# Patient Record
Sex: Female | Born: 1970
Health system: Southern US, Community
[De-identification: ages and names within clinical notes are randomized; demographics above are authoritative.]

## PROBLEM LIST (undated history)

## (undated) ENCOUNTER — Ambulatory Visit: Source: Home / Self Care

## (undated) DIAGNOSIS — S43429A Sprain of unspecified rotator cuff capsule, initial encounter: Secondary | ICD-10-CM

## (undated) DIAGNOSIS — D649 Anemia, unspecified: Secondary | ICD-10-CM

## (undated) DIAGNOSIS — M1990 Unspecified osteoarthritis, unspecified site: Secondary | ICD-10-CM

## (undated) DIAGNOSIS — G43909 Migraine, unspecified, not intractable, without status migrainosus: Secondary | ICD-10-CM

## (undated) DIAGNOSIS — J302 Other seasonal allergic rhinitis: Secondary | ICD-10-CM

## (undated) DIAGNOSIS — B019 Varicella without complication: Secondary | ICD-10-CM

## (undated) DIAGNOSIS — IMO0001 Reserved for inherently not codable concepts without codable children: Secondary | ICD-10-CM

## (undated) DIAGNOSIS — K5792 Diverticulitis of intestine, part unspecified, without perforation or abscess without bleeding: Secondary | ICD-10-CM

## (undated) DIAGNOSIS — M549 Dorsalgia, unspecified: Secondary | ICD-10-CM

## (undated) DIAGNOSIS — F329 Major depressive disorder, single episode, unspecified: Secondary | ICD-10-CM

## (undated) HISTORY — DX: Unspecified osteoarthritis, unspecified site: M19.90

## (undated) HISTORY — DX: Varicella without complication: B01.9

## (undated) HISTORY — DX: Migraine, unspecified, not intractable, without status migrainosus: G43.909

## (undated) HISTORY — DX: Major depressive disorder, single episode, unspecified: F32.9

## (undated) HISTORY — DX: Anemia, unspecified: D64.9

## (undated) HISTORY — PX: JOINT REPLACEMENT: SHX530

## (undated) HISTORY — DX: Diverticulitis of intestine, part unspecified, without perforation or abscess without bleeding: K57.92

## (undated) HISTORY — PX: TUBAL LIGATION: SHX77

---

## 1997-10-29 ENCOUNTER — Emergency Department (HOSPITAL_COMMUNITY): Admission: EM | Admit: 1997-10-29 | Discharge: 1997-10-29 | Payer: Self-pay | Admitting: Emergency Medicine

## 1998-08-13 ENCOUNTER — Encounter: Payer: Self-pay | Admitting: Family Medicine

## 1998-08-13 ENCOUNTER — Ambulatory Visit (HOSPITAL_COMMUNITY): Admission: RE | Admit: 1998-08-13 | Discharge: 1998-08-13 | Payer: Self-pay | Admitting: Family Medicine

## 1999-12-20 ENCOUNTER — Emergency Department (HOSPITAL_COMMUNITY): Admission: EM | Admit: 1999-12-20 | Discharge: 1999-12-20 | Payer: Self-pay | Admitting: Emergency Medicine

## 2000-02-17 ENCOUNTER — Encounter: Payer: Self-pay | Admitting: Internal Medicine

## 2000-02-17 ENCOUNTER — Encounter: Admission: RE | Admit: 2000-02-17 | Discharge: 2000-02-17 | Payer: Self-pay | Admitting: Internal Medicine

## 2000-10-30 ENCOUNTER — Inpatient Hospital Stay (HOSPITAL_COMMUNITY): Admission: EM | Admit: 2000-10-30 | Discharge: 2000-11-01 | Payer: Self-pay | Admitting: Emergency Medicine

## 2002-01-13 DIAGNOSIS — F32A Depression, unspecified: Secondary | ICD-10-CM

## 2002-01-13 HISTORY — DX: Depression, unspecified: F32.A

## 2002-04-25 ENCOUNTER — Other Ambulatory Visit: Admission: RE | Admit: 2002-04-25 | Discharge: 2002-04-25 | Payer: Self-pay | Admitting: Family Medicine

## 2002-08-16 ENCOUNTER — Other Ambulatory Visit: Admission: RE | Admit: 2002-08-16 | Discharge: 2002-08-16 | Payer: Self-pay | Admitting: Family Medicine

## 2005-05-14 ENCOUNTER — Encounter: Payer: Self-pay | Admitting: Emergency Medicine

## 2006-12-17 ENCOUNTER — Other Ambulatory Visit: Admission: RE | Admit: 2006-12-17 | Discharge: 2006-12-17 | Payer: Self-pay | Admitting: Obstetrics and Gynecology

## 2007-10-20 ENCOUNTER — Emergency Department (HOSPITAL_COMMUNITY): Admission: EM | Admit: 2007-10-20 | Discharge: 2007-10-21 | Payer: Self-pay | Admitting: Emergency Medicine

## 2007-10-31 ENCOUNTER — Emergency Department (HOSPITAL_BASED_OUTPATIENT_CLINIC_OR_DEPARTMENT_OTHER): Admission: EM | Admit: 2007-10-31 | Discharge: 2007-11-01 | Payer: Self-pay | Admitting: Emergency Medicine

## 2007-11-18 ENCOUNTER — Encounter: Admission: RE | Admit: 2007-11-18 | Discharge: 2007-11-18 | Payer: Self-pay | Admitting: Family Medicine

## 2007-12-29 ENCOUNTER — Ambulatory Visit: Payer: Self-pay | Admitting: Diagnostic Radiology

## 2007-12-29 ENCOUNTER — Emergency Department (HOSPITAL_BASED_OUTPATIENT_CLINIC_OR_DEPARTMENT_OTHER): Admission: EM | Admit: 2007-12-29 | Discharge: 2007-12-29 | Payer: Self-pay | Admitting: Emergency Medicine

## 2008-05-06 ENCOUNTER — Ambulatory Visit: Payer: Self-pay | Admitting: Diagnostic Radiology

## 2008-05-06 ENCOUNTER — Emergency Department (HOSPITAL_BASED_OUTPATIENT_CLINIC_OR_DEPARTMENT_OTHER): Admission: EM | Admit: 2008-05-06 | Discharge: 2008-05-07 | Payer: Self-pay | Admitting: Emergency Medicine

## 2009-11-13 ENCOUNTER — Emergency Department (HOSPITAL_BASED_OUTPATIENT_CLINIC_OR_DEPARTMENT_OTHER): Admission: EM | Admit: 2009-11-13 | Discharge: 2009-11-13 | Payer: Self-pay | Admitting: Emergency Medicine

## 2009-11-13 ENCOUNTER — Ambulatory Visit: Payer: Self-pay | Admitting: Diagnostic Radiology

## 2009-11-28 ENCOUNTER — Emergency Department (HOSPITAL_BASED_OUTPATIENT_CLINIC_OR_DEPARTMENT_OTHER): Admission: EM | Admit: 2009-11-28 | Discharge: 2009-11-28 | Payer: Self-pay | Admitting: Emergency Medicine

## 2010-01-03 ENCOUNTER — Emergency Department (HOSPITAL_COMMUNITY)
Admission: EM | Admit: 2010-01-03 | Discharge: 2010-01-03 | Payer: Self-pay | Source: Home / Self Care | Admitting: Emergency Medicine

## 2010-01-10 ENCOUNTER — Emergency Department (HOSPITAL_BASED_OUTPATIENT_CLINIC_OR_DEPARTMENT_OTHER)
Admission: EM | Admit: 2010-01-10 | Discharge: 2010-01-10 | Payer: Self-pay | Source: Home / Self Care | Admitting: Emergency Medicine

## 2010-01-13 HISTORY — PX: CERVICAL DISCECTOMY: SHX98

## 2010-02-22 ENCOUNTER — Encounter (HOSPITAL_COMMUNITY)
Admission: RE | Admit: 2010-02-22 | Discharge: 2010-02-22 | Disposition: A | Discharge status: Home / Self Care | Payer: Managed Care, Other (non HMO) | Source: Ambulatory Visit | Attending: Neurological Surgery | Admitting: Neurological Surgery

## 2010-02-22 ENCOUNTER — Other Ambulatory Visit (HOSPITAL_COMMUNITY): Payer: Self-pay | Admitting: Neurological Surgery

## 2010-02-22 ENCOUNTER — Ambulatory Visit (HOSPITAL_COMMUNITY)
Admission: RE | Admit: 2010-02-22 | Discharge: 2010-02-22 | Disposition: A | Discharge status: Home / Self Care | Payer: Managed Care, Other (non HMO) | Source: Ambulatory Visit | Attending: Neurological Surgery | Admitting: Neurological Surgery

## 2010-02-22 DIAGNOSIS — M47812 Spondylosis without myelopathy or radiculopathy, cervical region: Secondary | ICD-10-CM | POA: Insufficient documentation

## 2010-02-22 DIAGNOSIS — Z01818 Encounter for other preprocedural examination: Secondary | ICD-10-CM | POA: Insufficient documentation

## 2010-02-22 LAB — BASIC METABOLIC PANEL
CO2: 27 mEq/L (ref 19–32)
Calcium: 9.2 mg/dL (ref 8.4–10.5)
Chloride: 103 mEq/L (ref 96–112)
Creatinine, Ser: 0.63 mg/dL (ref 0.4–1.2)
GFR calc Af Amer: >60 mL/min (ref >60)
Glucose, Bld: 95 mg/dL (ref 70–99)

## 2010-02-22 LAB — PROTIME-INR
INR: 0.93 (ref 0.00–1.49)
Prothrombin Time: 12.7 seconds (ref 11.6–15.2)

## 2010-02-22 LAB — DIFFERENTIAL
Basophils Absolute: 0 10*3/uL (ref 0.0–0.1)
Eosinophils Absolute: 0.1 10*3/uL (ref 0.0–0.7)
Eosinophils Relative: 3 % (ref 0–5)
Neutro Abs: 2.5 10*3/uL (ref 1.7–7.7)
Neutrophils Relative %: 54 % (ref 43–77)

## 2010-02-22 LAB — CBC
HCT: 38.2 % (ref 36.0–46.0)
MCH: 29.3 pg (ref 26.0–34.0)
MCHC: 33.8 g/dL (ref 30.0–36.0)
MCV: 86.8 fL (ref 78.0–100.0)
Platelets: 311 10*3/uL (ref 150–400)
RBC: 4.4 MIL/uL (ref 3.87–5.11)

## 2010-02-22 LAB — SURGICAL PCR SCREEN: MRSA, PCR: NEGATIVE

## 2010-02-27 ENCOUNTER — Observation Stay (HOSPITAL_COMMUNITY)
Admission: RE | Admit: 2010-02-27 | Discharge: 2010-02-28 | Disposition: A | Discharge status: Home / Self Care | Payer: Managed Care, Other (non HMO) | Source: Ambulatory Visit | Attending: Neurological Surgery | Admitting: Neurological Surgery

## 2010-02-27 ENCOUNTER — Ambulatory Visit (HOSPITAL_COMMUNITY): Admission: RE | Admit: 2010-02-27 | Payer: Self-pay | Source: Home / Self Care | Admitting: Neurological Surgery

## 2010-02-27 ENCOUNTER — Ambulatory Visit (HOSPITAL_COMMUNITY): Payer: Managed Care, Other (non HMO)

## 2010-02-27 DIAGNOSIS — M47812 Spondylosis without myelopathy or radiculopathy, cervical region: Principal | ICD-10-CM | POA: Insufficient documentation

## 2010-03-04 NOTE — Op Note (Signed)
NAME:  Krystal, Mann NO.:  [id number]  MEDICAL RECORD NO.:  [id number]           PATIENT TYPE:  I  LOCATION:  3533                         FACILITY:  [facility]  PHYSICIAN:  Tia Alert, MD     DATE OF BIRTH:  1970-02-18  DATE OF PROCEDURE: DATE OF DISCHARGE:                              OPERATIVE REPORT   PREOPERATIVE DIAGNOSIS:  Cervical spondylosis C4-5 and C5-6 with neck and right arm pain.  POSTOPERATIVE DIAGNOSIS:  Cervical spondylosis C4-5 and C5-6 with neck and right arm pain.  PROCEDURES: 1. Decompressive anterior cervical diskectomy C4-5 and C5-6. 2. Anterior cervical arthrodesis C4-5 and C5-6 utilizing 8-mm PEEK     interbody cages packed with local autograft and Actifuse Putty. 3. Anterior cervical plating C4-C6 utilizing the Biomet MaxAn plate.  SURGEON:  Tia Alert, MD  ASSISTANT:  Donalee Citrin, MD  ANESTHESIA:  General endotracheal.  COMPLICATIONS:  None apparent.  INDICATIONS FOR PROCEDURE:  Ms. Krystal Mann is a 40 year old female who was referred with neck and right arm pain.  She had an MRI which showed severe spondylosis at C4-5 and C5-6.  She tried medical management for quite some time without significant relief.  I recommended two-level ACDF and plating at C4-5 and C5-6 in hopes of improving her pain syndrome.  She understood the risks, the benefits, expected outcome, and wished to proceed.  DESCRIPTION OF PROCEDURE:  The patient was taken to the operating room, and after induction of adequate general endotracheal anesthesia she was placed in supine position on the operating room table.  Her right anterior cervical region was prepped with DuraPrep and then draped in usual sterile fashion.  Local anesthesia 3 mL was injected and a transverse incision was made to the right of midline and carried down to do platysma which was elevated and then undermined with Metzenbaum scissors.  I then dissected a plane medial to the  sternocleidomastoid muscle and internal carotid artery and lateral to the trachea and esophagus to expose C4-5 and C5-6.  Intraoperative x-ray confirmed my level and then I took down the longus colli muscles.  I removed the anterior osteophytes at C5-6 and then place the Shadow-Line retractors. I incised the disk space at both levels and performed the initial diskectomy with pituitary rongeurs and curved curettes.  I then used the high-speed drill to drill the endplates.  I widened the disk spaces to about 8 mm at each level drilling in a rectangular fashion, preparing the endplates for later arthrodesis.  I drilled down to the level of the posterior spurs.  The drill shavings were saved in a mucous trap for later arthrodesis.  I then brought in the operating microscope.  The exact same decompression was done of both levels.  I was able to open the posterior longitudinal ligament and then removed it undercutting the bodies of C4 and C5 and C5 and C6 until the dura was full and capacious all across, and bilateral foraminotomies were performed.  I would pass the nerve hook easily in a circumferential fashion and into the foramen. I could see the cord pulsatile through the dura.  The  dura appeared to be relaxed.  Therefore, I felt like I had adequate decompression of both levels, irrigated with saline solution, dried the surgical bed, and then placed 8-mm PEEK interbody cages into the interspaces at each level. The cages were packed with a local autograft and Actifuse Putty.  I then used a Biomet MaxAn plate and placed two 14-mm variable angle screws in the bodies C4, C5, and C6, and these were locked into plate by locking mechanism within the plate.  I then irrigated with saline solution containing bacitracin, dried all bleeding points with Surgifoam and bipolar cautery.  Once meticulous hemostasis was achieved, I closed the platysma with 3-0 Vicryl closing the subcuticular tissue with 0  Vicryl and closed the skin with Benzoin and Steri-Strips.  The drapes were removed.  Sterile dressing was applied.  The patient was awakened from anesthesia and transferred to recovery room in stable condition.  At the end of the procedure, all sponge, needle, and instrument counts were correct.     Tia Alert, MD     DSJ/MEDQ  D:  02/27/2010  T:  02/27/2010  Job:  [id number]  Electronically Signed by Marikay Alar MD on 03/04/2010 10:54:13 AM

## 2010-04-23 ENCOUNTER — Ambulatory Visit
Admission: RE | Admit: 2010-04-23 | Discharge: 2010-04-23 | Disposition: A | Discharge status: Home / Self Care | Payer: Managed Care, Other (non HMO) | Source: Ambulatory Visit | Attending: Neurological Surgery | Admitting: Neurological Surgery

## 2010-04-23 ENCOUNTER — Other Ambulatory Visit: Payer: Self-pay | Admitting: Neurological Surgery

## 2010-04-23 DIAGNOSIS — M47812 Spondylosis without myelopathy or radiculopathy, cervical region: Secondary | ICD-10-CM

## 2010-04-23 DIAGNOSIS — M542 Cervicalgia: Secondary | ICD-10-CM

## 2010-04-23 DIAGNOSIS — M79609 Pain in unspecified limb: Secondary | ICD-10-CM

## 2010-06-25 ENCOUNTER — Emergency Department (HOSPITAL_BASED_OUTPATIENT_CLINIC_OR_DEPARTMENT_OTHER)
Admission: EM | Admit: 2010-06-25 | Discharge: 2010-06-25 | Disposition: A | Discharge status: Home / Self Care | Payer: Managed Care, Other (non HMO) | Attending: Emergency Medicine | Admitting: Emergency Medicine

## 2010-06-25 DIAGNOSIS — R112 Nausea with vomiting, unspecified: Secondary | ICD-10-CM | POA: Insufficient documentation

## 2010-06-25 DIAGNOSIS — R197 Diarrhea, unspecified: Secondary | ICD-10-CM | POA: Insufficient documentation

## 2010-06-25 LAB — URINALYSIS, ROUTINE W REFLEX MICROSCOPIC
Bilirubin Urine: NEGATIVE
Ketones, ur: NEGATIVE mg/dL
Specific Gravity, Urine: 1.011 (ref 1.005–1.030)

## 2010-06-25 LAB — BASIC METABOLIC PANEL
Calcium: 9.3 mg/dL (ref 8.4–10.5)
GFR calc Af Amer: >60 mL/min (ref >60)
GFR calc non Af Amer: >60 mL/min (ref >60)

## 2010-06-25 LAB — URINE MICROSCOPIC-ADD ON

## 2010-07-02 ENCOUNTER — Emergency Department (HOSPITAL_BASED_OUTPATIENT_CLINIC_OR_DEPARTMENT_OTHER)
Admission: EM | Admit: 2010-07-02 | Discharge: 2010-07-02 | Disposition: A | Discharge status: Home / Self Care | Payer: Managed Care, Other (non HMO) | Attending: Emergency Medicine | Admitting: Emergency Medicine

## 2010-07-02 ENCOUNTER — Emergency Department (INDEPENDENT_AMBULATORY_CARE_PROVIDER_SITE_OTHER): Payer: Managed Care, Other (non HMO)

## 2010-07-02 DIAGNOSIS — M25569 Pain in unspecified knee: Secondary | ICD-10-CM | POA: Insufficient documentation

## 2010-10-14 LAB — POCT I-STAT, CHEM 8
BUN: 6
Chloride: 103
Glucose, Bld: 102 — ABNORMAL HIGH
HCT: 38
Hemoglobin: 12.9
Potassium: 3.8
TCO2: 28

## 2010-10-14 LAB — URINALYSIS, ROUTINE W REFLEX MICROSCOPIC
Bilirubin Urine: NEGATIVE
Nitrite: NEGATIVE
Protein, ur: NEGATIVE
Urobilinogen, UA: 1
pH: 6

## 2010-10-14 LAB — URINE MICROSCOPIC-ADD ON

## 2010-10-14 LAB — POCT PREGNANCY, URINE: Preg Test, Ur: NEGATIVE

## 2010-10-18 LAB — URINALYSIS, ROUTINE W REFLEX MICROSCOPIC
Protein, ur: NEGATIVE mg/dL
Urobilinogen, UA: 1 mg/dL (ref 0.0–1.0)

## 2010-10-18 LAB — WET PREP, GENITAL

## 2010-10-18 LAB — PREGNANCY, URINE: Preg Test, Ur: NEGATIVE

## 2010-10-18 LAB — DIFFERENTIAL
Basophils Relative: 3 % — ABNORMAL HIGH (ref 0–1)
Lymphs Abs: 1.5 10*3/uL (ref 0.7–4.0)
Monocytes Absolute: 0.7 10*3/uL (ref 0.1–1.0)
Monocytes Relative: 11 % (ref 3–12)
Neutro Abs: 3.4 10*3/uL (ref 1.7–7.7)

## 2010-10-18 LAB — GC/CHLAMYDIA PROBE AMP, GENITAL
Chlamydia, DNA Probe: NEGATIVE
GC Probe Amp, Genital: NEGATIVE

## 2010-10-18 LAB — RPR: RPR Ser Ql: NONREACTIVE

## 2010-10-18 LAB — CBC
Hemoglobin: 12.1 g/dL (ref 12.0–15.0)
MCHC: 33 g/dL (ref 30.0–36.0)
RBC: 4.34 MIL/uL (ref 3.87–5.11)

## 2010-10-18 LAB — COMPREHENSIVE METABOLIC PANEL
AST: 26 U/L (ref 0–37)
Albumin: 4.2 g/dL (ref 3.5–5.2)
CO2: 26 mEq/L (ref 19–32)
Calcium: 9.3 mg/dL (ref 8.4–10.5)
Creatinine, Ser: 0.7 mg/dL (ref 0.4–1.2)
GFR calc Af Amer: >60 mL/min (ref >60)
GFR calc non Af Amer: >60 mL/min (ref >60)

## 2010-12-19 ENCOUNTER — Ambulatory Visit (INDEPENDENT_AMBULATORY_CARE_PROVIDER_SITE_OTHER): Payer: Managed Care, Other (non HMO) | Admitting: Obstetrics & Gynecology

## 2010-12-19 VITALS — BP 119/80 | HR 92 | Temp 97.1°F | Ht 67.0 in | Wt 231.0 lb

## 2010-12-19 DIAGNOSIS — Z1231 Encounter for screening mammogram for malignant neoplasm of breast: Secondary | ICD-10-CM

## 2010-12-19 DIAGNOSIS — Z23 Encounter for immunization: Secondary | ICD-10-CM

## 2010-12-19 DIAGNOSIS — Z01419 Encounter for gynecological examination (general) (routine) without abnormal findings: Secondary | ICD-10-CM

## 2010-12-19 MED ORDER — INFLUENZA VIRUS VACC SPLIT PF IM SUSP
0.5000 mL | Freq: Once | INTRAMUSCULAR | Status: AC
  Administered 2010-12-19: 0.5 mL via INTRAMUSCULAR

## 2010-12-19 NOTE — Progress Notes (Signed)
  Subjective:     Krystal Mann is a 40 y.o. [id number] female here for a routine exam.  Current complaints: None.     Gynecologic History Patient's last menstrual period was 12/04/2010. Last mammogram: 2009. Results were: normal  Obstetric History OB History    Grav Para Term Preterm Abortions TAB SAB Ect Mult Living   3 2 1 1 1  1   2      # Outc Date GA Lbr Len/2nd Wgt Sex Del Anes PTL Lv   1 TRM            2 PRE            3 SAB                The following portions of the patient's history were reviewed and updated as appropriate: allergies, current medications, past family history, past medical history, past social history, past surgical history and problem list.  Review of Systems A comprehensive review of systems was negative.    Objective:  Blood pressure 119/80, pulse 92, temperature 97.1 F (36.2 C), temperature source Oral, height 5\' 7"  (1.702 m), weight 231 lb (104.781 kg), last menstrual period 12/04/2010.  GENERAL: Well-developed, well-nourished female in no acute distress.  HEENT: Normocephalic, atraumatic. Sclerae anicteric.  NECK: Supple. Normal thyroid.  LUNGS: Clear to auscultation bilaterally.  HEART: Regular rate and rhythm. BREASTS: Symmetric with everted nipples. No masses, skin changes, nipple drainage, or lymphadenopathy. ABDOMEN: Soft, nontender, nondistended. No organomegaly. PELVIC: Normal external female genitalia. Vagina is pink and rugated.  Normal discharge. Normal cervix contour. Pap smear obtained. Uterus is normal in size. No adnexal mass or tenderness.  EXTREMITIES: No cyanosis, clubbing, or edema, 2+ distal pulses.     Assessment:    Healthy female exam.    Plan:    Follow up pap and schedule mammogram.  RTC in one year or as needed.

## 2010-12-19 NOTE — Patient Instructions (Signed)

## 2011-01-01 ENCOUNTER — Ambulatory Visit: Payer: Managed Care, Other (non HMO) | Admitting: Obstetrics and Gynecology

## 2011-01-23 ENCOUNTER — Ambulatory Visit (HOSPITAL_COMMUNITY): Admission: RE | Admit: 2011-01-23 | Payer: Managed Care, Other (non HMO) | Source: Ambulatory Visit

## 2011-03-17 ENCOUNTER — Emergency Department (HOSPITAL_BASED_OUTPATIENT_CLINIC_OR_DEPARTMENT_OTHER)
Admission: EM | Admit: 2011-03-17 | Discharge: 2011-03-17 | Disposition: A | Discharge status: Home / Self Care | Payer: Managed Care, Other (non HMO) | Attending: Emergency Medicine | Admitting: Emergency Medicine

## 2011-03-17 ENCOUNTER — Encounter (HOSPITAL_BASED_OUTPATIENT_CLINIC_OR_DEPARTMENT_OTHER): Payer: Self-pay | Admitting: Family Medicine

## 2011-03-17 DIAGNOSIS — J3489 Other specified disorders of nose and nasal sinuses: Secondary | ICD-10-CM | POA: Insufficient documentation

## 2011-03-17 DIAGNOSIS — R05 Cough: Secondary | ICD-10-CM | POA: Insufficient documentation

## 2011-03-17 DIAGNOSIS — F3289 Other specified depressive episodes: Secondary | ICD-10-CM | POA: Insufficient documentation

## 2011-03-17 DIAGNOSIS — R220 Localized swelling, mass and lump, head: Secondary | ICD-10-CM | POA: Insufficient documentation

## 2011-03-17 DIAGNOSIS — J111 Influenza due to unidentified influenza virus with other respiratory manifestations: Secondary | ICD-10-CM | POA: Insufficient documentation

## 2011-03-17 DIAGNOSIS — R059 Cough, unspecified: Secondary | ICD-10-CM | POA: Insufficient documentation

## 2011-03-17 DIAGNOSIS — F329 Major depressive disorder, single episode, unspecified: Secondary | ICD-10-CM | POA: Insufficient documentation

## 2011-03-17 DIAGNOSIS — IMO0001 Reserved for inherently not codable concepts without codable children: Secondary | ICD-10-CM | POA: Insufficient documentation

## 2011-03-17 DIAGNOSIS — R221 Localized swelling, mass and lump, neck: Secondary | ICD-10-CM | POA: Insufficient documentation

## 2011-03-17 DIAGNOSIS — R110 Nausea: Secondary | ICD-10-CM | POA: Insufficient documentation

## 2011-03-17 DIAGNOSIS — R070 Pain in throat: Secondary | ICD-10-CM | POA: Insufficient documentation

## 2011-03-17 DIAGNOSIS — M1710 Unilateral primary osteoarthritis, unspecified knee: Secondary | ICD-10-CM | POA: Insufficient documentation

## 2011-03-17 DIAGNOSIS — R51 Headache: Secondary | ICD-10-CM | POA: Insufficient documentation

## 2011-03-17 DIAGNOSIS — R509 Fever, unspecified: Secondary | ICD-10-CM | POA: Insufficient documentation

## 2011-03-17 HISTORY — DX: Dorsalgia, unspecified: M54.9

## 2011-03-17 HISTORY — DX: Sprain of unspecified rotator cuff capsule, initial encounter: S43.429A

## 2011-03-17 MED ORDER — OSELTAMIVIR PHOSPHATE 75 MG PO CAPS: 75.0000 mg | ORAL_CAPSULE | Freq: Two times a day (BID) | ORAL | Status: AC

## 2011-03-17 NOTE — ED Provider Notes (Signed)
History     CSN: [id number]  Arrival date & time 03/17/11  1242   First MD Initiated Contact with Patient 03/17/11 1321      Chief Complaint  Patient presents with  . Cough  . Sore Throat  . Facial Pain    (Consider location/radiation/quality/duration/timing/severity/associated sxs/prior treatment) Patient is a 41 y.o. female presenting with pharyngitis and URI. The history is provided by the patient.  Sore Throat Pertinent negatives include no abdominal pain and no headaches.  URI The primary symptoms include fever, sore throat, cough, nausea and myalgias. Primary symptoms do not include headaches, swollen glands, abdominal pain or vomiting. The current episode started yesterday. This is a new problem. The problem has been gradually worsening.  The onset of the illness is associated with exposure to sick contacts. Symptoms associated with the illness include chills, sinus pressure, congestion and rhinorrhea.    Past Medical History  Diagnosis Date  . Anemia   . Depression 2004  . Arthritis     knee  . Back pain   . Rotator cuff (capsule) sprain     Past Surgical History  Procedure Date  . Spine surgery   . Tubal ligation     Family History  Problem Relation Age of Onset  . Cancer Mother     malaginant cancer, skin cancer  . Diabetes Mother   . Cancer Father     lung cancer    History  Substance Use Topics  . Smoking status: Never Smoker   . Smokeless tobacco: Never Used  . Alcohol Use: No    OB History    Grav Para Term Preterm Abortions TAB SAB Ect Mult Living   3 2 1 1 1  1   2       Review of Systems  Constitutional: Positive for fever and chills.  HENT: Positive for congestion, sore throat, rhinorrhea and sinus pressure.   Respiratory: Positive for cough.   Gastrointestinal: Positive for nausea. Negative for vomiting and abdominal pain.  Musculoskeletal: Positive for myalgias.  Neurological: Negative for headaches.  All other systems reviewed  and are negative.    Allergies  Morphine and related; Hydrocodone; and Hydroxyprogesterone caproate  Home Medications   Current Outpatient Rx  Name Route Sig Dispense Refill  . ZICAM COLD REMEDY PO Oral Take by mouth.    . TRAMADOL HCL 50 MG PO TABS Oral Take 50 mg by mouth every 8 (eight) hours as needed.      BP 125/84  Pulse 60  Temp(Src) 98.8 F (37.1 C) (Oral)  Resp 14  SpO2 98%  LMP 03/17/2011  Physical Exam  Nursing note and vitals reviewed. Constitutional: She is oriented to person, place, and time. She appears well-developed and well-nourished. No distress.  HENT:  Head: Normocephalic and atraumatic.  Right Ear: Tympanic membrane and ear canal normal.  Left Ear: Tympanic membrane and ear canal normal.  Nose: Mucosal edema and rhinorrhea present.  Mouth/Throat: Posterior oropharyngeal erythema present. No oropharyngeal exudate, posterior oropharyngeal edema or tonsillar abscesses.  Eyes: EOM are normal. Pupils are equal, round, and reactive to light.  Neck: Normal range of motion.  Cardiovascular: Normal rate, regular rhythm, normal heart sounds and intact distal pulses.  Exam reveals no friction rub.   No murmur heard. Pulmonary/Chest: Effort normal and breath sounds normal. She has no wheezes. She has no rales.  Abdominal: Soft. Bowel sounds are normal. She exhibits no distension. There is no tenderness. There is no rebound and no guarding.  Musculoskeletal: Normal range of motion. She exhibits no tenderness.       Shoulder in sling due to the torn rotator cuff  Lymphadenopathy:    She has no cervical adenopathy.  Neurological: She is alert and oriented to person, place, and time. No cranial nerve deficit.  Skin: Skin is warm and dry. No rash noted.  Psychiatric: She has a normal mood and affect. Her behavior is normal.    ED Course  Procedures (including critical care time)  Labs Reviewed - No data to display No results found.   No diagnosis  found.    MDM   Pt with symptoms consistent with influenza.  Normal exam here but is febrile.  No signs of breathing difficulty  No signs of strep pharyngitis, otitis or abnormal abdominal findings.   Will continue antipyretica and rest and fluids and return for any further problems.  Pt started on tamiflu.         Gwyneth Sprout, MD 03/17/11 1334

## 2011-03-17 NOTE — ED Notes (Signed)
Pt c/o dry cough, sinus pressure, sore throat and chest congestion.

## 2011-06-18 ENCOUNTER — Other Ambulatory Visit (INDEPENDENT_AMBULATORY_CARE_PROVIDER_SITE_OTHER): Payer: Self-pay

## 2011-06-18 DIAGNOSIS — R300 Dysuria: Secondary | ICD-10-CM

## 2011-06-18 LAB — POCT URINALYSIS DIP (DEVICE)
Ketones, ur: NEGATIVE mg/dL
Nitrite: NEGATIVE
Protein, ur: NEGATIVE mg/dL
Urobilinogen, UA: 0.2 mg/dL (ref 0.0–1.0)

## 2011-06-18 NOTE — Progress Notes (Signed)
Pt stopped by clinic states that she thinks she has a uti, has dysuria. Pt could not stay to wait for results. Advised her that we would run urinalysis and have provider review. We will let her know the results, and if medication is needed.  Rosalita Chessman reviewed patients urinalysis results and advised that we culture and have patient use Azo standard for now. Patient notified and agrees with plan.

## 2011-06-21 LAB — URINE CULTURE

## 2011-10-07 ENCOUNTER — Other Ambulatory Visit: Payer: Self-pay | Admitting: Obstetrics and Gynecology

## 2011-10-07 MED ORDER — HYDROCHLOROTHIAZIDE 25 MG PO TABS: 25.0000 mg | ORAL_TABLET | Freq: Every day | ORAL | Status: DC

## 2011-10-08 ENCOUNTER — Telehealth: Payer: Self-pay | Admitting: *Deleted

## 2011-10-08 MED ORDER — HYDROCHLOROTHIAZIDE 25 MG PO TABS: 25.0000 mg | ORAL_TABLET | Freq: Every day | ORAL | Status: DC

## 2011-10-08 NOTE — Telephone Encounter (Signed)
Pt prescribed hctz by Dr Jolayne Panther yesterday, she went to the pharmacy twice to get it and was told it was not there. Pt requests that prescription be sent to [facility] on [address].

## 2011-10-10 ENCOUNTER — Ambulatory Visit (INDEPENDENT_AMBULATORY_CARE_PROVIDER_SITE_OTHER): Payer: Self-pay | Admitting: Family Medicine

## 2011-10-10 ENCOUNTER — Encounter: Payer: Self-pay | Admitting: Family Medicine

## 2011-10-10 VITALS — BP 117/82 | HR 105 | Temp 99.2°F | Ht 67.5 in | Wt 256.0 lb

## 2011-10-10 DIAGNOSIS — M7989 Other specified soft tissue disorders: Secondary | ICD-10-CM

## 2011-10-10 DIAGNOSIS — R600 Localized edema: Secondary | ICD-10-CM | POA: Insufficient documentation

## 2011-10-10 DIAGNOSIS — Z7189 Other specified counseling: Secondary | ICD-10-CM

## 2011-10-10 DIAGNOSIS — Z7689 Persons encountering health services in other specified circumstances: Secondary | ICD-10-CM

## 2011-10-10 NOTE — Progress Notes (Signed)
  Subjective:    Patient ID: Krystal Mann, female    DOB: 05-Oct-1970, 41 y.o.   MRN: [id number]  HPI  Krystal Mann presents to establish care.  She complains of lower extremity edema that started a few weeks ago.  She does not know the cause.  Dr. Jolayne Panther at [facility] clinic started her on HCTZ although she does not have a history of HTN.  She says this has helped with the swelling.  She has also been elevating her feet which helps.  She denies any shortness of breath or chest pain, or any cardiac history.    Past Medical History  Diagnosis Date  . Anemia   . Depression 2004  . Arthritis     knee  . Back pain   . Rotator cuff (capsule) sprain    Past Surgical History  Procedure Date  . Spine surgery   . Tubal ligation    Family History  Problem Relation Age of Onset  . Cancer Mother     malaginant cancer, skin cancer  . Diabetes Mother   . Hyperlipidemia Mother   . Cancer Father     lung cancer  . Diabetes Sister   . Asthma Sister    History  Substance Use Topics  . Smoking status: Never Smoker   . Smokeless tobacco: Never Used  . Alcohol Use: No     Review of Systems See HPI    Objective:   Physical Exam BP 117/82  Pulse 105  Temp 99.2 F (37.3 C) (Oral)  Ht 5' 7.5" (1.715 m)  Wt 256 lb (116.121 kg)  BMI 39.50 kg/m2  LMP 09/19/2011 General appearance: alert, cooperative and no distress Neck: no JVD and supple, symmetrical, trachea midline Lungs: clear to auscultation bilaterally Heart: regular rate and rhythm, S1, S2 normal, no murmur, click, rub or gallop Extremities: trace edema Pulses: 2+ and symmetric       Assessment & Plan:

## 2011-10-10 NOTE — Patient Instructions (Signed)
It was nice to meet you.  Please go see Rudell Cobb and apply for the orange card.  Please come back an see me in about a month after you get the card.    In the meantime, you can try wearing compression stockings on your legs when you are on your feet to help with the swelling.

## 2011-10-10 NOTE — Assessment & Plan Note (Signed)
Reviewed PMHx, FamHx, Surgical Hx, Medications, and Soc Hx.  Will work on health-maintenance next visit after patient has orange card.

## 2011-10-10 NOTE — Assessment & Plan Note (Signed)
Feel this is likely dependent edema, suggested compression hose.  Will have her continue HCTZ for now. She is going to get the orange card and will come back, I will get labs to check for causes of edema.

## 2011-11-11 ENCOUNTER — Ambulatory Visit: Payer: Self-pay | Admitting: Family Medicine

## 2011-11-17 ENCOUNTER — Ambulatory Visit: Payer: Self-pay | Admitting: Family Medicine

## 2012-01-14 HISTORY — PX: BREAST CYST ASPIRATION: SHX578

## 2012-02-18 ENCOUNTER — Emergency Department (HOSPITAL_BASED_OUTPATIENT_CLINIC_OR_DEPARTMENT_OTHER)
Admission: EM | Admit: 2012-02-18 | Discharge: 2012-02-18 | Disposition: A | Discharge status: Home / Self Care | Payer: Self-pay | Attending: Emergency Medicine | Admitting: Emergency Medicine

## 2012-02-18 ENCOUNTER — Encounter (HOSPITAL_BASED_OUTPATIENT_CLINIC_OR_DEPARTMENT_OTHER): Payer: Self-pay

## 2012-02-18 DIAGNOSIS — Z8659 Personal history of other mental and behavioral disorders: Secondary | ICD-10-CM | POA: Insufficient documentation

## 2012-02-18 DIAGNOSIS — Z8739 Personal history of other diseases of the musculoskeletal system and connective tissue: Secondary | ICD-10-CM | POA: Insufficient documentation

## 2012-02-18 DIAGNOSIS — Z862 Personal history of diseases of the blood and blood-forming organs and certain disorders involving the immune mechanism: Secondary | ICD-10-CM | POA: Insufficient documentation

## 2012-02-18 DIAGNOSIS — IMO0002 Reserved for concepts with insufficient information to code with codable children: Secondary | ICD-10-CM | POA: Insufficient documentation

## 2012-02-18 DIAGNOSIS — L02413 Cutaneous abscess of right upper limb: Secondary | ICD-10-CM

## 2012-02-18 MED ORDER — SULFAMETHOXAZOLE-TMP DS 800-160 MG PO TABS
1.0000 | ORAL_TABLET | Freq: Once | ORAL | Status: AC
  Administered 2012-02-18: 1 via ORAL
  Filled 2012-02-18: qty 1

## 2012-02-18 MED ORDER — OXYCODONE-ACETAMINOPHEN 5-325 MG PO TABS: 1.0000 | ORAL_TABLET | Freq: Four times a day (QID) | ORAL | Status: DC | PRN

## 2012-02-18 MED ORDER — SULFAMETHOXAZOLE-TMP DS 800-160 MG PO TABS: 1.0000 | ORAL_TABLET | Freq: Two times a day (BID) | ORAL | Status: DC

## 2012-02-18 MED ORDER — CEPHALEXIN 500 MG PO CAPS: 500.0000 mg | ORAL_CAPSULE | Freq: Four times a day (QID) | ORAL | Status: DC

## 2012-02-18 MED ORDER — OXYCODONE-ACETAMINOPHEN 5-325 MG PO TABS
2.0000 | ORAL_TABLET | Freq: Once | ORAL | Status: AC
  Administered 2012-02-18: 2 via ORAL
  Filled 2012-02-18 (×2): qty 2

## 2012-02-18 MED ORDER — OXYCODONE-ACETAMINOPHEN 5-325 MG PO TABS: 2.0000 | ORAL_TABLET | Freq: Once | ORAL | Status: DC

## 2012-02-18 MED ORDER — CEPHALEXIN 250 MG PO CAPS
500.0000 mg | ORAL_CAPSULE | Freq: Once | ORAL | Status: AC
  Administered 2012-02-18: 500 mg via ORAL
  Filled 2012-02-18: qty 2

## 2012-02-18 NOTE — ED Provider Notes (Signed)
History     CSN: [id number]  Arrival date & time 02/18/12  0908   First MD Initiated Contact with Patient 02/18/12 1057      Chief Complaint  Patient presents with  . Wound Infection    (Consider location/radiation/quality/duration/timing/severity/associated sxs/prior treatment) HPI Pt reports 5 days of progressively worsening pain and swelling on R upper arm. No known bites or injury. No fever, no drainage. Pain is severe, aching and worse with movement of elbow. Pt denies IVDA.  Past Medical History  Diagnosis Date  . Anemia   . Depression 2004  . Arthritis     knee  . Back pain   . Rotator cuff (capsule) sprain     Past Surgical History  Procedure Date  . Spine surgery   . Tubal ligation     Family History  Problem Relation Age of Onset  . Cancer Mother     malaginant cancer, skin cancer  . Diabetes Mother   . Hyperlipidemia Mother   . Cancer Father     lung cancer  . Diabetes Sister   . Asthma Sister     History  Substance Use Topics  . Smoking status: Never Smoker   . Smokeless tobacco: Never Used  . Alcohol Use: No    OB History    Grav Para Term Preterm Abortions TAB SAB Ect Mult Living   3 2 1 1 1  1   2       Review of Systems All other systems reviewed and are negative except as noted in HPI.   Allergies  Morphine and related; Hydrocodone; and Hydroxyprogesterone caproate  Home Medications   Current Outpatient Rx  Name  Route  Sig  Dispense  Refill  . IBUPROFEN 800 MG PO TABS   Oral   Take 800 mg by mouth every 8 (eight) hours as needed.           BP 134/85  Pulse 79  Temp 98.3 F (36.8 C) (Oral)  Resp 18  Wt 260 lb (117.935 kg)  SpO2 100%  LMP 02/18/2012  Physical Exam  Constitutional: She is oriented to person, place, and time. She appears well-developed and well-nourished.  HENT:  Head: Normocephalic and atraumatic.  Neck: Neck supple.  Pulmonary/Chest: Effort normal.  Musculoskeletal: She exhibits tenderness.     2cm x 3cm fluctuant abscess to distal R upper arm, just proximal to antecubital space with moderate surrounding cellulitis, tender to palpation, ROM limited by pain, no joint effusion or swelling  Neurological: She is alert and oriented to person, place, and time. No cranial nerve deficit.  Psychiatric: She has a normal mood and affect. Her behavior is normal.    ED Course  Procedures (including critical care time)  Labs Reviewed - No data to display No results found.   No diagnosis found.    MDM  Pt given oral Abx and pain medications.  INCISION AND DRAINAGE Performed by: Pollyann Savoy. Consent: Verbal consent obtained. Risks and benefits: risks, benefits and alternatives were discussed Time out performed prior to procedure Type: abscess Body area: R arm Anesthesia: local infiltration Incision was made with a scalpel. Local anesthetic: lidocaine 1% with epinephrine Anesthetic total: 2 ml Complexity: complex Blunt dissection to break up loculations Drainage: purulent Drainage amount: moderate Packing material: none Irrigated with saline Patient tolerance: Patient tolerated the procedure well with no immediate complications.           Charles B. Bernette Mayers, MD 02/18/12 [phone]

## 2012-02-18 NOTE — ED Notes (Signed)
Redness, swelling and pain noted to medial aspect of right arm. Onset Friday.

## 2012-02-18 NOTE — Discharge Instructions (Signed)
Abscess An abscess is an infected area that contains a collection of pus and debris. It can occur in almost any part of the body. An abscess is also known as a furuncle or boil. CAUSES   An abscess occurs when tissue gets infected. This can occur from blockage of oil or sweat glands, infection of hair follicles, or a minor injury to the skin. As the body tries to fight the infection, pus collects in the area and creates pressure under the skin. This pressure causes pain. People with weakened immune systems have difficulty fighting infections and get certain abscesses more often.   SYMPTOMS Usually an abscess develops on the skin and becomes a painful mass that is red, warm, and tender. If the abscess forms under the skin, you may feel a moveable soft area under the skin. Some abscesses break open (rupture) on their own, but most will continue to get worse without care. The infection can spread deeper into the body and eventually into the bloodstream, causing you to feel ill.   DIAGNOSIS   Your caregiver will take your medical history and perform a physical exam. A sample of fluid may also be taken from the abscess to determine what is causing your infection. TREATMENT   Your caregiver may prescribe antibiotic medicines to fight the infection. However, taking antibiotics alone usually does not cure an abscess. Your caregiver may need to make a small cut (incision) in the abscess to drain the pus. In some cases, gauze is packed into the abscess to reduce pain and to continue draining the area. HOME CARE INSTRUCTIONS    Only take over-the-counter or prescription medicines for pain, discomfort, or fever as directed by your caregiver.   If you were prescribed antibiotics, take them as directed. Finish them even if you start to feel better.   If gauze is used, follow your caregiver's directions for changing the gauze.   To avoid spreading the infection:   Keep your draining abscess covered with a  bandage.   Wash your hands well.   Do not share personal care items, towels, or whirlpools with others.   Avoid skin contact with others.   Keep your skin and clothes clean around the abscess.   Keep all follow-up appointments as directed by your caregiver.  SEEK MEDICAL CARE IF:    You have increased pain, swelling, redness, fluid drainage, or bleeding.   You have muscle aches, chills, or a general ill feeling.   You have a fever.  MAKE SURE YOU:    Understand these instructions.   Will watch your condition.   Will get help right away if you are not doing well or get worse.  Document Released: 10/09/2004 Document Revised: 07/01/2011 Document Reviewed: 03/14/2011 [employer] Patient Information 2013 [employer].    

## 2012-02-20 ENCOUNTER — Emergency Department (HOSPITAL_BASED_OUTPATIENT_CLINIC_OR_DEPARTMENT_OTHER)
Admission: EM | Admit: 2012-02-20 | Discharge: 2012-02-20 | Disposition: A | Discharge status: Home / Self Care | Payer: Self-pay | Attending: Emergency Medicine | Admitting: Emergency Medicine

## 2012-02-20 ENCOUNTER — Encounter (HOSPITAL_BASED_OUTPATIENT_CLINIC_OR_DEPARTMENT_OTHER): Payer: Self-pay | Admitting: *Deleted

## 2012-02-20 DIAGNOSIS — Z8659 Personal history of other mental and behavioral disorders: Secondary | ICD-10-CM | POA: Insufficient documentation

## 2012-02-20 DIAGNOSIS — L02419 Cutaneous abscess of limb, unspecified: Secondary | ICD-10-CM

## 2012-02-20 DIAGNOSIS — M129 Arthropathy, unspecified: Secondary | ICD-10-CM | POA: Insufficient documentation

## 2012-02-20 DIAGNOSIS — Z862 Personal history of diseases of the blood and blood-forming organs and certain disorders involving the immune mechanism: Secondary | ICD-10-CM | POA: Insufficient documentation

## 2012-02-20 DIAGNOSIS — Z79899 Other long term (current) drug therapy: Secondary | ICD-10-CM | POA: Insufficient documentation

## 2012-02-20 DIAGNOSIS — Z87828 Personal history of other (healed) physical injury and trauma: Secondary | ICD-10-CM | POA: Insufficient documentation

## 2012-02-20 DIAGNOSIS — IMO0002 Reserved for concepts with insufficient information to code with codable children: Secondary | ICD-10-CM | POA: Insufficient documentation

## 2012-02-20 NOTE — ED Notes (Signed)
MD at bedside. 

## 2012-02-20 NOTE — ED Provider Notes (Signed)
History     CSN: [id number]  Arrival date & time 02/20/12  0909   First MD Initiated Contact with Patient 02/20/12 [phone]      Chief Complaint  Patient presents with  . Wound Check    (Consider location/radiation/quality/duration/timing/severity/associated sxs/prior treatment) HPI Pt presents for recheck of right arm abscess which had I and D performed 2 days ago.  She is taking bactrim as prescribed.  No fever/chills, no vomiting.  Area of redness around the abscess is improving.  Pt contiues to take percocet for pain.  There are no other associated systemic symptoms, there are no other alleviating or modifying factors.   Past Medical History  Diagnosis Date  . Anemia   . Depression 2004  . Arthritis     knee  . Back pain   . Rotator cuff (capsule) sprain     Past Surgical History  Procedure Date  . Spine surgery   . Tubal ligation     Family History  Problem Relation Age of Onset  . Cancer Mother     malaginant cancer, skin cancer  . Diabetes Mother   . Hyperlipidemia Mother   . Cancer Father     lung cancer  . Diabetes Sister   . Asthma Sister     History  Substance Use Topics  . Smoking status: Never Smoker   . Smokeless tobacco: Never Used  . Alcohol Use: No    OB History    Grav Para Term Preterm Abortions TAB SAB Ect Mult Living   3 2 1 1 1  1   2       Review of Systems ROS reviewed and all otherwise negative except for mentioned in HPI  Allergies  Morphine and related; Hydrocodone; and Hydroxyprogesterone caproate  Home Medications   Current Outpatient Rx  Name  Route  Sig  Dispense  Refill  . CEPHALEXIN 500 MG PO CAPS   Oral   Take 1 capsule (500 mg total) by mouth 4 (four) times daily.   28 capsule   0   . IBUPROFEN 800 MG PO TABS   Oral   Take 800 mg by mouth every 8 (eight) hours as needed.         . OXYCODONE-ACETAMINOPHEN 5-325 MG PO TABS   Oral   Take 1-2 tablets by mouth every 6 (six) hours as needed for pain.   20  tablet   0   . SULFAMETHOXAZOLE-TMP DS 800-160 MG PO TABS   Oral   Take 1 tablet by mouth 2 (two) times daily.   14 tablet   0     BP 115/76  Pulse 77  Temp 98.7 F (37.1 C) (Oral)  Resp 18  SpO2 100%  LMP 02/18/2012 Vitals reviewed Physical Exam Physical Examination: General appearance - alert, well appearing, and in no distress Mental status - alert, oriented to person, place, and time Eyes - pupils equal and reactive, extraocular eye movements intact Extremities - peripheral pulses normal, no pedal edema, no clubbing or cyanosis Skin - normal coloration and turgor except on right upper arm, there is draining abscess with mild surrounding erythema, no streaking of erythema up affected extremity  ED Course  Procedures (including critical care time)  Labs Reviewed - No data to display No results found.   1. Abscess of arm       MDM  Pt presenting for recheck of abscess on arm.  Compared to cell phone picture pt has with her the area of  surrounding cellulitis appears to be improving.  Discharged with strict return precautions.  Pt agreeable with plan.       Ethelda Chick, MD 02/20/12 [phone]

## 2012-02-20 NOTE — ED Notes (Signed)
Pt here for wound check s/p I&D right arm x 2 days ago- has not changed dressing

## 2012-07-21 ENCOUNTER — Ambulatory Visit: Payer: Self-pay | Admitting: Family Medicine

## 2012-07-26 ENCOUNTER — Other Ambulatory Visit: Payer: Self-pay | Admitting: *Deleted

## 2012-07-26 DIAGNOSIS — N6310 Unspecified lump in the right breast, unspecified quadrant: Secondary | ICD-10-CM

## 2012-07-27 ENCOUNTER — Ambulatory Visit (HOSPITAL_COMMUNITY)
Admission: RE | Admit: 2012-07-27 | Discharge: 2012-07-27 | Disposition: A | Discharge status: Home / Self Care | Payer: Self-pay | Source: Ambulatory Visit | Attending: Obstetrics and Gynecology | Admitting: Obstetrics and Gynecology

## 2012-07-27 ENCOUNTER — Encounter (HOSPITAL_COMMUNITY): Payer: Self-pay

## 2012-07-27 VITALS — BP 100/68 | Temp 98.5°F | Ht 65.5 in | Wt 254.6 lb

## 2012-07-27 DIAGNOSIS — Z1239 Encounter for other screening for malignant neoplasm of breast: Secondary | ICD-10-CM

## 2012-07-27 DIAGNOSIS — N6315 Unspecified lump in the right breast, overlapping quadrants: Secondary | ICD-10-CM

## 2012-07-27 HISTORY — DX: Unspecified lump in the right breast, overlapping quadrants: N63.15

## 2012-07-27 NOTE — Progress Notes (Signed)
Complaints of right breast lump x 2 weeks. Patient stated lump is painful. Patient rates pain at a 5-6 out of 10 and rated pain at a 10 out of 10 when something touches it.  Pap Smear:    Pap smear not completed today. Last Pap smear was 12/19/2010 at [facility] and normal. Per patient no history of an abnormal Pap smear. Last Pap smear result is in EPIC.  Physical exam: Breasts Breasts symmetrical. No skin abnormalities bilateral breasts. No nipple retraction bilateral breasts. No nipple discharge bilateral breasts. No lymphadenopathy. No lumps palpated left breast. Palpated lump within the right breast at 9 o'clock under the areola. Patient complained of pain when palpated lump and complained of right axillary tenderness on exam. Referred patient to the [facility] for diagnostic mammogram and right breast ultrasound. Appointment scheduled for Wednesday, August 04, 2012 at 1510.      Pelvic/Bimanual No Pap smear completed today since last Pap smear was 12/19/2010. Pap smear not indicated per BCCCP guidelines.

## 2012-07-27 NOTE — Patient Instructions (Signed)
Taught Sharol Roussel how to perform BSE. Patient did not need a Pap smear today due to last Pap smear was 12/19/2010. Let her know [facility] will cover Pap smears every 3 years unless has a history of abnormal Pap smears. Referred patient to the [facility] for diagnostic mammogram and right breast ultrasound. Appointment scheduled for Wednesday, August 04, 2012 at 1510. Patient aware of appointment and will be there.  Sharol Roussel verbalized understanding.  Merial Moritz, Kathaleen Maser, RN 3:56 PM

## 2012-08-04 ENCOUNTER — Ambulatory Visit
Admission: RE | Admit: 2012-08-04 | Discharge: 2012-08-04 | Disposition: A | Discharge status: Home / Self Care | Payer: No Typology Code available for payment source | Source: Ambulatory Visit | Attending: Obstetrics and Gynecology | Admitting: Obstetrics and Gynecology

## 2012-08-04 ENCOUNTER — Other Ambulatory Visit: Payer: Self-pay | Admitting: Obstetrics and Gynecology

## 2012-08-04 DIAGNOSIS — N6310 Unspecified lump in the right breast, unspecified quadrant: Secondary | ICD-10-CM

## 2012-11-18 ENCOUNTER — Other Ambulatory Visit: Payer: Self-pay

## 2013-11-14 ENCOUNTER — Encounter (HOSPITAL_COMMUNITY): Payer: Self-pay

## 2013-12-16 ENCOUNTER — Emergency Department (HOSPITAL_BASED_OUTPATIENT_CLINIC_OR_DEPARTMENT_OTHER): Payer: Self-pay

## 2013-12-16 ENCOUNTER — Emergency Department (HOSPITAL_BASED_OUTPATIENT_CLINIC_OR_DEPARTMENT_OTHER)
Admission: EM | Admit: 2013-12-16 | Discharge: 2013-12-16 | Disposition: A | Discharge status: Home / Self Care | Payer: Self-pay | Attending: Emergency Medicine | Admitting: Emergency Medicine

## 2013-12-16 ENCOUNTER — Encounter (HOSPITAL_BASED_OUTPATIENT_CLINIC_OR_DEPARTMENT_OTHER): Payer: Self-pay

## 2013-12-16 DIAGNOSIS — J40 Bronchitis, not specified as acute or chronic: Secondary | ICD-10-CM | POA: Insufficient documentation

## 2013-12-16 DIAGNOSIS — Z3202 Encounter for pregnancy test, result negative: Secondary | ICD-10-CM | POA: Insufficient documentation

## 2013-12-16 DIAGNOSIS — M791 Myalgia: Secondary | ICD-10-CM | POA: Insufficient documentation

## 2013-12-16 DIAGNOSIS — M1990 Unspecified osteoarthritis, unspecified site: Secondary | ICD-10-CM | POA: Insufficient documentation

## 2013-12-16 DIAGNOSIS — Z862 Personal history of diseases of the blood and blood-forming organs and certain disorders involving the immune mechanism: Secondary | ICD-10-CM | POA: Insufficient documentation

## 2013-12-16 DIAGNOSIS — Z87828 Personal history of other (healed) physical injury and trauma: Secondary | ICD-10-CM | POA: Insufficient documentation

## 2013-12-16 DIAGNOSIS — R05 Cough: Secondary | ICD-10-CM

## 2013-12-16 DIAGNOSIS — R059 Cough, unspecified: Secondary | ICD-10-CM

## 2013-12-16 DIAGNOSIS — Z79899 Other long term (current) drug therapy: Secondary | ICD-10-CM | POA: Insufficient documentation

## 2013-12-16 DIAGNOSIS — Z8659 Personal history of other mental and behavioral disorders: Secondary | ICD-10-CM | POA: Insufficient documentation

## 2013-12-16 DIAGNOSIS — R42 Dizziness and giddiness: Secondary | ICD-10-CM | POA: Insufficient documentation

## 2013-12-16 DIAGNOSIS — Z7952 Long term (current) use of systemic steroids: Secondary | ICD-10-CM | POA: Insufficient documentation

## 2013-12-16 DIAGNOSIS — Z792 Long term (current) use of antibiotics: Secondary | ICD-10-CM | POA: Insufficient documentation

## 2013-12-16 LAB — PREGNANCY, URINE: PREG TEST UR: NEGATIVE

## 2013-12-16 LAB — URINE MICROSCOPIC-ADD ON

## 2013-12-16 LAB — URINALYSIS, ROUTINE W REFLEX MICROSCOPIC
Bilirubin Urine: NEGATIVE
GLUCOSE, UA: NEGATIVE mg/dL
KETONES UR: 15 mg/dL — AB
LEUKOCYTES UA: NEGATIVE
NITRITE: NEGATIVE
PH: 6 (ref 5.0–8.0)
Protein, ur: NEGATIVE mg/dL
SPECIFIC GRAVITY, URINE: 1.021 (ref 1.005–1.030)
Urobilinogen, UA: 1 mg/dL (ref 0.0–1.0)

## 2013-12-16 MED ORDER — PREDNISONE 50 MG PO TABS
60.0000 mg | ORAL_TABLET | Freq: Once | ORAL | Status: AC
  Administered 2013-12-16: 60 mg via ORAL

## 2013-12-16 MED ORDER — PREDNISONE 10 MG PO TABS: 40.0000 mg | ORAL_TABLET | Freq: Every day | ORAL | Status: DC

## 2013-12-16 MED ORDER — DM-GUAIFENESIN ER 30-600 MG PO TB12: 1.0000 | ORAL_TABLET | Freq: Two times a day (BID) | ORAL | Status: DC

## 2013-12-16 MED ORDER — ALBUTEROL SULFATE HFA 108 (90 BASE) MCG/ACT IN AERS
2.0000 | INHALATION_SPRAY | Freq: Four times a day (QID) | RESPIRATORY_TRACT | Status: DC
Start: 2013-12-16 — End: 2013-12-16
  Administered 2013-12-16: 2 via RESPIRATORY_TRACT

## 2013-12-16 NOTE — ED Provider Notes (Signed)
CSN: [id number]     Arrival date & time 12/16/13  1257 History   First MD Initiated Contact with Patient 12/16/13 1542     Chief Complaint  Patient presents with  . Cough     (Consider location/radiation/quality/duration/timing/severity/associated sxs/prior Treatment) Patient is a 43 y.o. female presenting with cough. The history is provided by the patient.  Cough Associated symptoms: chills, myalgias and sore throat   Associated symptoms: no chest pain, no ear pain, no fever, no headaches, no rash, no shortness of breath and no wheezing    patient presents with persistent cough that has been ongoing for 3 weeks. Associated with some muscle soreness due to the cough. Also mentioned episode of dizziness. The cough is occasionally productive no blood. No nausea vomiting or diarrhea. No fevers. Patient's been trying over-the-counter cold medicines to help control the cough. Congestion early on the sore throat does have essentially resolved still occasional congestion.  Past Medical History  Diagnosis Date  . Anemia   . Depression 2004  . Arthritis     knee  . Back pain   . Rotator cuff (capsule) sprain    Past Surgical History  Procedure Laterality Date  . Spine surgery    . Tubal ligation     Family History  Problem Relation Age of Onset  . Cancer Mother     malaginant cancer (kidney), skin cancer  . Diabetes Mother   . Hyperlipidemia Mother   . Cancer Father     lung cancer  . Diabetes Sister   . Asthma Sister    History  Substance Use Topics  . Smoking status: Never Smoker   . Smokeless tobacco: Never Used  . Alcohol Use: No   OB History    Gravida Para Term Preterm AB TAB SAB Ectopic Multiple Living   3 2 1 1 1  1   2      Review of Systems  Constitutional: Positive for chills. Negative for fever.  HENT: Positive for congestion and sore throat. Negative for ear pain.   Eyes: Negative for redness.  Respiratory: Positive for cough. Negative for shortness of  breath and wheezing.   Cardiovascular: Negative for chest pain.  Gastrointestinal: Negative for nausea, vomiting, abdominal pain and diarrhea.  Genitourinary: Negative for dysuria.  Musculoskeletal: Positive for myalgias.  Skin: Negative for rash.  Neurological: Positive for dizziness. Negative for headaches.  Hematological: Does not bruise/bleed easily.  Psychiatric/Behavioral: Negative for confusion.      Allergies  Morphine and related; Hydrocodone; and Hydroxyprogesterone caproate  Home Medications   Prior to Admission medications   Medication Sig Start Date End Date Taking? Authorizing Provider  cephALEXin (KEFLEX) 500 MG capsule Take 1 capsule (500 mg total) by mouth 4 (four) times daily. 02/18/12   Charles B. Karle Starch, MD  dextromethorphan-guaiFENesin [facility] DM) 30-600 MG per 12 hr tablet Take 1 tablet by mouth 2 (two) times daily. 12/16/13   Fredia Sorrow, MD  ibuprofen (ADVIL,MOTRIN) 800 MG tablet Take 800 mg by mouth every 8 (eight) hours as needed.    Historical Provider, MD  NON FORMULARY 500 mg. Arthritis pain used PRN    Historical Provider, MD  oxyCODONE-acetaminophen (PERCOCET/ROXICET) 5-325 MG per tablet Take 1-2 tablets by mouth every 6 (six) hours as needed for pain. 02/18/12   Charles B. Karle Starch, MD  predniSONE (DELTASONE) 10 MG tablet Take 4 tablets (40 mg total) by mouth daily. 12/16/13   Fredia Sorrow, MD  sulfamethoxazole-trimethoprim (BACTRIM DS) 800-160 MG per tablet Take 1  tablet by mouth 2 (two) times daily. 02/18/12   Charles B. Karle Starch, MD   BP 131/79 mmHg  Pulse 79  Temp(Src) 99.1 F (37.3 C) (Oral)  Resp 19  Ht 6' (1.829 m)  Wt 252 lb (114.306 kg)  BMI 34.17 kg/m2  SpO2 100%  LMP 12/16/2013 Physical Exam  Constitutional: She is oriented to person, place, and time. She appears well-developed and well-nourished. No distress.  HENT:  Head: Normocephalic and atraumatic.  Mouth/Throat: Oropharynx is clear and moist. No oropharyngeal exudate.  Eyes:  Conjunctivae and EOM are normal. Pupils are equal, round, and reactive to light.  Neck: Normal range of motion.  Cardiovascular: Normal rate and regular rhythm.   Pulmonary/Chest: Effort normal and breath sounds normal. No respiratory distress. She has no wheezes. She has no rales.  Abdominal: Soft. Bowel sounds are normal. There is no tenderness.  Musculoskeletal: Normal range of motion. She exhibits no edema.  Neurological: She is alert and oriented to person, place, and time. No cranial nerve deficit. She exhibits normal muscle tone. Coordination normal.  Skin: Skin is warm. No rash noted.  Nursing note and vitals reviewed.   ED Course  Procedures (including critical care time) Labs Review Labs Reviewed  URINALYSIS, ROUTINE W REFLEX MICROSCOPIC - Abnormal; Notable for the following:    Hgb urine dipstick MODERATE (*)    Ketones, ur 15 (*)    All other components within normal limits  PREGNANCY, URINE  URINE MICROSCOPIC-ADD ON    Imaging Review Dg Chest 2 View  12/16/2013   CLINICAL DATA:  Cough and chest congestion for the past week.  EXAM: CHEST  2 VIEW  COMPARISON:  02/22/2010.  FINDINGS: The heart size and mediastinal contours are within normal limits. Both lungs are clear. The visualized skeletal structures are unremarkable.  IMPRESSION: Normal examination.   Electronically Signed   By: Enrique Sack M.D.   On: 12/16/2013 14:11     EKG Interpretation None      MDM   Final diagnoses:  Bronchitis   Patient with three-week history of cough started out as like a standard upper respiratory infection with sore throat and nasal congestion. Chest x-ray negative for pneumonia. No significant vital sign abnormalities room air saturations 100%. Symptoms consistent with a bronchitis. Will treat as such with albuterol inhaler Mucinex DM and a course of prednisone. Patient is nontoxic no acute distress.     Fredia Sorrow, MD 12/16/13 [phone]

## 2013-12-16 NOTE — ED Notes (Signed)
Pt reports 3 weeks of intermittent cough and congestion, chest wall soreness secondary to coughing and dizziness with coughing episodes.  Chills/sweats at night.

## 2013-12-16 NOTE — Discharge Instructions (Signed)
Cough, Adult  A cough is a reflex. It helps you clear your throat and airways. A cough can help heal your body. A cough can last 2 or 3 weeks (acute) or may last more than 8 weeks (chronic). Some common causes of a cough can include an infection, allergy, or a cold. HOME CARE  Only take medicine as told by your doctor.  If given, take your medicines (antibiotics) as told. Finish them even if you start to feel better.  Use a cold steam vaporizer or humidifier in your home. This can help loosen thick spit (secretions).  Sleep so you are almost sitting up (semi-upright). Use pillows to do this. This helps reduce coughing.  Rest as needed.  Stop smoking if you smoke. GET HELP RIGHT AWAY IF:  You have yellowish-white fluid (pus) in your thick spit.  Your cough gets worse.  Your medicine does not reduce coughing, and you are losing sleep.  You cough up blood.  You have trouble breathing.  Your pain gets worse and medicine does not help.  You have a fever. MAKE SURE YOU:   Understand these instructions.  Will watch your condition.  Will get help right away if you are not doing well or get worse. Document Released: 09/12/2010 Document Revised: 05/16/2013 Document Reviewed: 09/12/2010 [facility] Patient Information 2015 [place], [place]. This information is not intended to replace advice given to you by your health care provider. Make sure you discuss any questions you have with your health care provider.  Return for any new or worse symptoms. Recommend Motrin as needed for the body soreness. Albuterol inhaler 2 puffs every 6 hours for the next 7 days then as needed. Mucinex DM as directed. And prednisone for the next 5 days.

## 2014-03-05 ENCOUNTER — Emergency Department (HOSPITAL_BASED_OUTPATIENT_CLINIC_OR_DEPARTMENT_OTHER)
Admission: EM | Admit: 2014-03-05 | Discharge: 2014-03-05 | Disposition: A | Discharge status: Home / Self Care | Payer: Self-pay | Attending: Emergency Medicine | Admitting: Emergency Medicine

## 2014-03-05 ENCOUNTER — Encounter (HOSPITAL_BASED_OUTPATIENT_CLINIC_OR_DEPARTMENT_OTHER): Payer: Self-pay

## 2014-03-05 DIAGNOSIS — Z8659 Personal history of other mental and behavioral disorders: Secondary | ICD-10-CM | POA: Insufficient documentation

## 2014-03-05 DIAGNOSIS — L732 Hidradenitis suppurativa: Secondary | ICD-10-CM | POA: Insufficient documentation

## 2014-03-05 DIAGNOSIS — L02411 Cutaneous abscess of right axilla: Secondary | ICD-10-CM | POA: Insufficient documentation

## 2014-03-05 DIAGNOSIS — Z87828 Personal history of other (healed) physical injury and trauma: Secondary | ICD-10-CM | POA: Insufficient documentation

## 2014-03-05 DIAGNOSIS — Z862 Personal history of diseases of the blood and blood-forming organs and certain disorders involving the immune mechanism: Secondary | ICD-10-CM | POA: Insufficient documentation

## 2014-03-05 DIAGNOSIS — M13869 Other specified arthritis, unspecified knee: Secondary | ICD-10-CM | POA: Insufficient documentation

## 2014-03-05 MED ORDER — NAPROXEN 500 MG PO TABS: 500.0000 mg | ORAL_TABLET | Freq: Two times a day (BID) | ORAL | Status: DC | PRN

## 2014-03-05 MED ORDER — HYDROMORPHONE HCL 1 MG/ML IJ SOLN
0.5000 mg | Freq: Once | INTRAMUSCULAR | Status: AC
  Administered 2014-03-05: 0.5 mg via INTRAMUSCULAR
  Filled 2014-03-05: qty 1

## 2014-03-05 MED ORDER — LIDOCAINE-EPINEPHRINE (PF) 2 %-1:200000 IJ SOLN
10.0000 mL | Freq: Once | INTRAMUSCULAR | Status: AC
  Administered 2014-03-05: 10 mL
  Filled 2014-03-05: qty 20

## 2014-03-05 MED ORDER — OXYCODONE-ACETAMINOPHEN 5-325 MG PO TABS: 1.0000 | ORAL_TABLET | Freq: Four times a day (QID) | ORAL | Status: DC | PRN

## 2014-03-05 MED ORDER — DOXYCYCLINE HYCLATE 100 MG PO CAPS: 100.0000 mg | ORAL_CAPSULE | Freq: Two times a day (BID) | ORAL | Status: DC

## 2014-03-05 NOTE — Discharge Instructions (Signed)
Keep wound clean and dry. Apply warm compresses to affected area throughout the day. Take antibiotic until it is finished, and avoid direct sunlight. Take naprosyn and percocet as directed, as needed for pain but do not drive or operate machinery with pain medication use. Followup with general surgeon in 1 week for ongoing management of your hydradenitis. Monitor area for signs of infection to include, but not limited to: increasing pain, redness, drainage/pus, or swelling. Return to emergency department for emergent changing or worsening symptoms.   Hidradenitis Suppurativa, Sweat Gland Abscess Hidradenitis suppurativa is a long lasting (chronic), uncommon disease of the sweat glands. With this, boil-like lumps and scarring develop in the groin, some times under the arms (axillae), and under the breasts. It may also uncommonly occur behind the ears, in the crease of the buttocks, and around the genitals.  CAUSES  The cause is from a blocking of the sweat glands. They then become infected. It may cause drainage and odor. It is not contagious. So it cannot be given to someone else. It most often shows up in puberty (about 56 to 46 years of age). But it may happen much later. It is similar to acne which is a disease of the sweat glands. This condition is slightly more common in African-Americans and women. SYMPTOMS   Hidradenitis usually starts as one or more red, tender, swellings in the groin or under the arms (axilla).  Over a period of hours to days the lesions get larger. They often open to the skin surface, draining clear to yellow-colored fluid.  The infected area heals with scarring. DIAGNOSIS  Your caregiver makes this diagnosis by looking at you. Sometimes cultures (growing germs on plates in the lab) may be taken. This is to see what germ (bacterium) is causing the infection.  TREATMENT   Topical germ killing medicine applied to the skin (antibiotics) are the treatment of choice.  Antibiotics taken by mouth (systemic) are sometimes needed when the condition is getting worse or is severe.  Avoid tight-fitting clothing which traps moisture in.  Dirt does not cause hidradenitis and it is not caused by poor hygiene.  Involved areas should be cleaned daily using an antibacterial soap. Some patients find that the liquid form of Lever 2000, applied to the involved areas as a lotion after bathing, can help reduce the odor related to this condition.  Sometimes surgery is needed to drain infected areas or remove scarred tissue. Removal of large amounts of tissue is used only in severe cases.  Birth control pills may be helpful.  Oral retinoids (vitamin A derivatives) for 6 to 12 months which are effective for acne may also help this condition.  Weight loss will improve but not cure hidradenitis. It is made worse by being overweight. But the condition is not caused by being overweight.  This condition is more common in people who have had acne.  It may become worse under stress. There is no medical cure for hidradenitis. It can be controlled, but not cured. The condition usually continues for years with periods of getting worse and getting better (remission). Document Released: 08/14/2003 Document Revised: 03/24/2011 Document Reviewed: 04/01/2013 [facility] Patient Information 2015 [place], [place]. This information is not intended to replace advice given to you by your health care provider. Make sure you discuss any questions you have with your health care provider.  Abscess An abscess (boil or furuncle) is an infected area on or under the skin. This area is filled with yellowish-white fluid (pus)  and other material (debris). HOME CARE   Only take medicines as told by your doctor.  If you were given antibiotic medicine, take it as directed. Finish the medicine even if you start to feel better.  If gauze is used, follow your doctor's directions for changing the gauze.  To  avoid spreading the infection:  Keep your abscess covered with a bandage.  Wash your hands well.  Do not share personal care items, towels, or whirlpools with others.  Avoid skin contact with others.  Keep your skin and clothes clean around the abscess.  Keep all doctor visits as told. GET HELP RIGHT AWAY IF:   You have more pain, puffiness (swelling), or redness in the wound site.  You have more fluid or blood coming from the wound site.  You have muscle aches, chills, or you feel sick.  You have a fever. MAKE SURE YOU:   Understand these instructions.  Will watch your condition.  Will get help right away if you are not doing well or get worse. Document Released: 06/18/2007 Document Revised: 07/01/2011 Document Reviewed: 03/14/2011 [facility] Patient Information 2015 [place], [place]. This information is not intended to replace advice given to you by your health care provider. Make sure you discuss any questions you have with your health care provider.  Abscess Care After An abscess (also called a boil or furuncle) is an infected area that contains a collection of pus. Signs and symptoms of an abscess include pain, tenderness, redness, or hardness, or you may feel a moveable soft area under your skin. An abscess can occur anywhere in the body. The infection may spread to surrounding tissues causing cellulitis. A cut (incision) by the surgeon was made over your abscess and the pus was drained out. Gauze may have been packed into the space to provide a drain that will allow the cavity to heal from the inside outwards. The boil may be painful for 5 to 7 days. Most people with a boil do not have high fevers. Your abscess, if seen early, may not have localized, and may not have been lanced. If not, another appointment may be required for this if it does not get better on its own or with medications. HOME CARE INSTRUCTIONS   Only take over-the-counter or prescription medicines for pain,  discomfort, or fever as directed by your caregiver.  When you bathe, soak and then remove gauze or iodoform packs at least daily or as directed by your caregiver. You may then wash the wound gently with mild soapy water. Repack with gauze or do as your caregiver directs. SEEK IMMEDIATE MEDICAL CARE IF:   You develop increased pain, swelling, redness, drainage, or bleeding in the wound site.  You develop signs of generalized infection including muscle aches, chills, fever, or a general ill feeling.  An oral temperature above 102 F (38.9 C) develops, not controlled by medication. See your caregiver for a recheck if you develop any of the symptoms described above. If medications (antibiotics) were prescribed, take them as directed. Document Released: 07/18/2004 Document Revised: 03/24/2011 Document Reviewed: 03/15/2007 [facility] Patient Information 2015 [place], [place]. This information is not intended to replace advice given to you by your health care provider. Make sure you discuss any questions you have with your health care provider.

## 2014-03-05 NOTE — ED Notes (Signed)
Patient here with complaint of bilateral axillary abscesses with drainage x 2 weeks.

## 2014-03-05 NOTE — ED Provider Notes (Signed)
CSN: [id number]     Arrival date & time 03/05/14  1134 History   First MD Initiated Contact with Patient 03/05/14 1303     Chief Complaint  Patient presents with  . Abscess     (Consider location/radiation/quality/duration/timing/severity/associated sxs/prior Treatment) HPI Comments: Krystal Mann is a 44 y.o. female with a PMHx of anemia, depression, arthritis, and chronic back pain, who presents to the ED with complaints of bilateral axillary abscesses 4 months. She noticed knots in her axilla in October, which gradually enlarged, and started to drain a purulent but occasionally serous fluid 1 month. She reports a very malodorous drainage. Reports the pain is increased, stating it is a 10/10 sharp axillary pain occasionally radiating into her breasts, only occurring with movement, unrelieved with Aleve. She noted some erythema, but denies any warmth. Reports some nausea and 4 days of loose stool approximately 2-3 times per day. She denies any fevers, chills, chest pain or shortness of breath, abdominal pain, vomiting, melena, hematochezia, dysuria, hematuria, vaginal discharge, myalgias, arthralgias, numbness, tingling, weakness, recent travel, or antibiotic use. Last menstrual. Started yesterday is currently ongoing. She has never sought treatment for these areas.  Patient is a 44 y.o. female presenting with abscess. The history is provided by the patient. No language interpreter was used.  Abscess Location:  Shoulder/arm Shoulder/arm abscess location:  L axilla and R axilla Abscess quality: draining, induration, painful and redness   Abscess quality: no warmth   Red streaking: no   Duration:  4 months Progression:  Worsening Pain details:    Quality:  Sharp   Severity:  Severe   Duration:  4 months   Timing:  Intermittent   Progression:  Unchanged Chronicity:  Chronic Context: not diabetes, not immunosuppression, not injected drug use, not insect bite/sting and not skin injury    Relieved by:  Nothing Exacerbated by: arm movement. Ineffective treatments:  NSAIDs Associated symptoms: nausea   Associated symptoms: no fever, no headaches and no vomiting     Past Medical History  Diagnosis Date  . Anemia   . Depression 2004  . Arthritis     knee  . Back pain   . Rotator cuff (capsule) sprain    Past Surgical History  Procedure Laterality Date  . Spine surgery    . Tubal ligation     Family History  Problem Relation Age of Onset  . Cancer Mother     malaginant cancer (kidney), skin cancer  . Diabetes Mother   . Hyperlipidemia Mother   . Cancer Father     lung cancer  . Diabetes Sister   . Asthma Sister    History  Substance Use Topics  . Smoking status: Never Smoker   . Smokeless tobacco: Never Used  . Alcohol Use: No   OB History    Gravida Para Term Preterm AB TAB SAB Ectopic Multiple Living   3 2 1 1 1  1   2      Review of Systems  Constitutional: Negative for fever and chills.  Respiratory: Negative for shortness of breath.   Cardiovascular: Negative for chest pain.  Gastrointestinal: Positive for nausea and diarrhea (2 episodes of loose stool daily x4 days). Negative for vomiting, abdominal pain and blood in stool.  Genitourinary: Positive for vaginal bleeding (on menses). Negative for dysuria, hematuria, flank pain and vaginal discharge.  Musculoskeletal: Negative for myalgias, arthralgias and neck pain.  Skin: Positive for color change (R abscess w/erythema).  Allergic/Immunologic: Negative for immunocompromised state.  Neurological: Negative for weakness, numbness and headaches.  Hematological: Negative for adenopathy.   10 Systems reviewed and are negative for acute change except as noted in the HPI.    Allergies  Morphine and related; Hydrocodone; and Hydroxyprogesterone caproate  Home Medications   Prior to Admission medications   Medication Sig Start Date End Date Taking? Authorizing Provider  ibuprofen (ADVIL,MOTRIN)  800 MG tablet Take 800 mg by mouth every 8 (eight) hours as needed.    Historical Provider, MD  NON FORMULARY 500 mg. Arthritis pain used PRN    Historical Provider, MD   BP 117/77 mmHg  Pulse 80  Temp(Src) 98.6 F (37 C) (Oral)  Resp 18  SpO2 98% Physical Exam  Constitutional: She is oriented to person, place, and time. Vital signs are normal. She appears well-developed and well-nourished.  Non-toxic appearance. No distress.  Afebrile nontoxic NAD  HENT:  Head: Normocephalic and atraumatic.  Mouth/Throat: Oropharynx is clear and moist and mucous membranes are normal.  Eyes: Conjunctivae and EOM are normal. Right eye exhibits no discharge. Left eye exhibits no discharge.  Neck: Normal range of motion. Neck supple.  Cardiovascular: Normal rate, regular rhythm, normal heart sounds and intact distal pulses.  Exam reveals no gallop and no friction rub.   No murmur heard. Pulmonary/Chest: Effort normal and breath sounds normal. No respiratory distress. She has no decreased breath sounds. She has no wheezes. She has no rhonchi. She has no rales.  Abdominal: Soft. Normal appearance and bowel sounds are normal. She exhibits no distension. There is no tenderness. There is no rigidity, no rebound, no guarding, no CVA tenderness, no tenderness at McBurney's point and negative Murphy's sign.  Musculoskeletal: Normal range of motion.  Lymphadenopathy:    She has axillary adenopathy.       Right axillary: No pectoral adenopathy present.       Left axillary: No pectoral adenopathy present. Mild axillary LAD, no pectoral LAD  Neurological: She is alert and oriented to person, place, and time. She has normal strength. No sensory deficit.  Skin: Skin is warm, dry and intact. No rash noted. There is erythema.  Small 3cm abscess to R axilla with mild erythema and central fluctuance, no warmth, with multiple sinus drainage tracts noted in b/l axilla with purulent drainage, some induration, no surrounding  cellulitis or red streaking  Psychiatric: She has a normal mood and affect. Her behavior is normal.  Nursing note and vitals reviewed.   ED Course  INCISION AND DRAINAGE Date/Time: 03/05/2014 2:34 PM Performed by: Shann Medal, Treasure Ochs STRUPP Authorized by: Corine Shelter Consent: Verbal consent obtained. Risks and benefits: risks, benefits and alternatives were discussed Consent given by: patient Patient understanding: patient states understanding of the procedure being performed Patient consent: the patient's understanding of the procedure matches consent given Patient identity confirmed: verbally with patient Type: abscess Body area: upper extremity (R axilla) Anesthesia: local infiltration Local anesthetic: lidocaine 2% with epinephrine Anesthetic total: 2 ml Scalpel size: 11 Needle gauge: 25. Incision type: single straight Complexity: simple Drainage: purulent Drainage amount: moderate Wound treatment: wound left open Packing material: none Patient tolerance: Patient tolerated the procedure well with no immediate complications   (including critical care time) Labs Review Labs Reviewed - No data to display  Imaging Review No results found.   EKG Interpretation None      MDM   Final diagnoses:  Hydradenitis  Abscess of axilla, right    44 y.o. female with b/l axillary "abscesses" and drainage x  4 mos, progressively worsening. Exam reveals b/l hydradenitis, with R axilla abscess. Will give pain meds and perform I&D. Afebrile and nontoxic, doubt need for labs. Will likely need referral to general surgeon for ongoing management of hydradenitis. Will d/c home with pain meds and doxycycline after I&D.   2:54 PM I&D with purulent drainage. Will d/c home with previous plan. Pain improved after dilaudid and procedure. I explained the diagnosis and have given explicit precautions to return to the ER including for any other new or worsening symptoms. The  patient understands and accepts the medical plan as it's been dictated and I have answered their questions. Discharge instructions concerning home care and prescriptions have been given. The patient is STABLE and is discharged to home in good condition.  BP 111/69 mmHg  Pulse 68  Temp(Src) 98.6 F (37 C) (Oral)  Resp 20  Ht 5\' 7"  (1.702 m)  Wt 244 lb (110.678 kg)  BMI 38.21 kg/m2  SpO2 100%  Meds ordered this encounter  Medications  . lidocaine-EPINEPHrine (XYLOCAINE W/EPI) 2 %-1:200000 (PF) injection 10 mL    Sig:   . HYDROmorphone (DILAUDID) injection 0.5 mg    Sig:   . naproxen (NAPROSYN) 500 MG tablet    Sig: Take 1 tablet (500 mg total) by mouth 2 (two) times daily as needed for mild pain, moderate pain or headache (TAKE WITH MEALS.).    Dispense:  20 tablet    Refill:  0    Order Specific Question:  Supervising Provider    Answer:  Noemi Chapel D [[id number]]  . doxycycline (VIBRAMYCIN) 100 MG capsule    Sig: Take 1 capsule (100 mg total) by mouth 2 (two) times daily. One po bid x 7 days    Dispense:  14 capsule    Refill:  0    Order Specific Question:  Supervising Provider    Answer:  Noemi Chapel D [[id number]]  . oxyCODONE-acetaminophen (PERCOCET) 5-325 MG per tablet    Sig: Take 1 tablet by mouth every 6 (six) hours as needed for severe pain.    Dispense:  10 tablet    Refill:  0    Order Specific Question:  Supervising Provider    Answer:  Johnna Acosta [address] [place], PA-C 03/05/14 1459  Carmin Muskrat, MD 03/05/14 [phone]

## 2014-06-13 ENCOUNTER — Ambulatory Visit (INDEPENDENT_AMBULATORY_CARE_PROVIDER_SITE_OTHER)
Admission: RE | Admit: 2014-06-13 | Discharge: 2014-06-13 | Disposition: A | Discharge status: Home / Self Care | Payer: BC Managed Care – PPO | Source: Ambulatory Visit | Attending: Primary Care | Admitting: Primary Care

## 2014-06-13 ENCOUNTER — Ambulatory Visit (INDEPENDENT_AMBULATORY_CARE_PROVIDER_SITE_OTHER): Payer: BC Managed Care – PPO | Admitting: Primary Care

## 2014-06-13 ENCOUNTER — Encounter: Payer: Self-pay | Admitting: Primary Care

## 2014-06-13 ENCOUNTER — Telehealth: Payer: Self-pay | Admitting: Primary Care

## 2014-06-13 VITALS — BP 112/66 | HR 74 | Temp 98.3°F | Ht 67.0 in | Wt 236.0 lb

## 2014-06-13 DIAGNOSIS — G8929 Other chronic pain: Secondary | ICD-10-CM | POA: Diagnosis not present

## 2014-06-13 DIAGNOSIS — M25561 Pain in right knee: Secondary | ICD-10-CM

## 2014-06-13 DIAGNOSIS — L309 Dermatitis, unspecified: Secondary | ICD-10-CM | POA: Insufficient documentation

## 2014-06-13 DIAGNOSIS — L02419 Cutaneous abscess of limb, unspecified: Secondary | ICD-10-CM

## 2014-06-13 DIAGNOSIS — E669 Obesity, unspecified: Secondary | ICD-10-CM | POA: Insufficient documentation

## 2014-06-13 DIAGNOSIS — M25569 Pain in unspecified knee: Secondary | ICD-10-CM

## 2014-06-13 DIAGNOSIS — Z96651 Presence of right artificial knee joint: Secondary | ICD-10-CM | POA: Insufficient documentation

## 2014-06-13 HISTORY — DX: Cutaneous abscess of limb, unspecified: L02.419

## 2014-06-13 MED ORDER — IBUPROFEN 800 MG PO TABS: 800.0000 mg | ORAL_TABLET | Freq: Three times a day (TID) | ORAL | Status: DC | PRN

## 2014-06-13 MED ORDER — CLINDAMYCIN HCL 300 MG PO CAPS: 300.0000 mg | ORAL_CAPSULE | Freq: Three times a day (TID) | ORAL | Status: DC

## 2014-06-13 NOTE — Assessment & Plan Note (Signed)
Discussed importance of weight loss through healthy diet and exercise and that weight loss would help with her knee pain. She agreed. Information provided regarding healthy diet. Will continue to monitor.

## 2014-06-13 NOTE — Assessment & Plan Note (Signed)
Present bilaterally and recurrent. Not enough to lance today. Seen at [facility] ED for same in February 2016 and referred to general surgery. Patient did not go due to insurance reasons. Start Clindamycin TID x 10 days. Referral made to general surgery

## 2014-06-13 NOTE — Progress Notes (Signed)
Subjective:    Patient ID: Krystal Mann, female    DOB: February 06, 1970, 44 y.o.   MRN: [id number]  HPI  Krystal Mann is a 44 year old female who presents today to establish care and discuss the problems mentioned below. Will obtain old records.  1) Arthritis: Present to right knee and back. She's experiencing pain to her right knee for the past several years with worsening pain and decreased ROM over the past several months. Pain is worse upon standing/pressure. She purchased a cain which has helped to her reduce pain. This past weekend she was sitting on a hard bench for a graduation ceremony and walking long distances without her cain which caused moderate pain. She takes tylenol arthritis with some help. She was once managed on ibuprofen 800 mg tablets and would take them twice daily during flare-ups. 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   2) Depression: History of in the past and has felt better over the past 2 years since separating from her ex-husband. Denies feeling sad, loss of interest.  3) Abscesses: Present to bilateral axilla since October 2015. She presented to the emergency department in February 2016 and had them lanced. It was recommended by the emergency department provider that she follow up with a general surgeon to discuss surgical removal. The patient did not have insurance at that time and was unable to follow up. She reports pain to both axilla with some redness, swelling, and drainage today.  4) Obesity: She is working to lose weight and has cut back her portion sizes. Diet consists of [place] weight loss shakes (breakfast), pizza, mac and cheese, BBQ chicken, breads, chips, krispy kreme, McDonalds. Drinks mostly juice and water, occasional soda. She is not exercising.   5) Eczema: Present since childhood. Will be present between toes, fingers, hands, arm folds, posterior knees. Currently present to hands and left arm fold.  Reports itching and some discomfort. She's tried using shea butter without help.   Review of Systems  Constitutional: Negative for unexpected weight change.  HENT: Negative for rhinorrhea.   Respiratory: Negative for cough and shortness of breath.   Cardiovascular: Negative for chest pain.  Gastrointestinal: Negative for diarrhea and constipation.  Genitourinary: Negative for dysuria and frequency.  Musculoskeletal: Positive for arthralgias.  Skin:       History of eczema, present to bilateral hands and left arm fold.  Neurological: Negative for dizziness and headaches.  Psychiatric/Behavioral:       Denies concerns for anxiety or depression.       Past Medical History  Diagnosis Date  . Anemia   . Depression 2004  . Arthritis     knee  . Back pain   . Rotator cuff (capsule) sprain   . Migraines   . Chicken pox     History   Social History  . Marital Status: Single    Spouse Name: N/A  . Number of Children: N/A  . Years of Education: N/A   Occupational History  . Not on file.   Social History Main Topics  . Smoking status: Never Smoker   . Smokeless tobacco: Never Used  . Alcohol Use: No  . Drug Use: No  . Sexual Activity:    Partners: Male    Birth Control/ Protection: Surgical     Comment: tubial ligation   Other Topics Concern  . Not on file   Social History Narrative  Single.   2 Children, 1 grandson.   Works as a Teacher, early years/pre.   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    Past Surgical History  Procedure Laterality Date  . Cervical discectomy  2012  . Tubal ligation      Family History  Problem Relation Age of Onset  . Cancer Mother     malaginant cancer (kidney), skin cancer  . Diabetes Mother   . Hyperlipidemia Mother   . Cancer Father     lung cancer  . Diabetes Sister   . Asthma Sister     Allergies  Allergen Reactions  . Morphine And Related Itching and Other (See Comments)    vomiting  .  Hydrocodone Itching  . Hydroxyprogesterone Caproate Other (See Comments)    Severe headache    Current Outpatient Prescriptions on File Prior to Visit  Medication Sig Dispense Refill  . naproxen (NAPROSYN) 500 MG tablet Take 1 tablet (500 mg total) by mouth 2 (two) times daily as needed for mild pain, moderate pain or headache (TAKE WITH MEALS.). (Patient not taking: Reported on 06/13/2014) 20 tablet 0   No current facility-administered medications on file prior to visit.    BP 112/66 mmHg  Pulse 74  Temp(Src) 98.3 F (36.8 C) (Oral)  Ht 5\' 7"  (1.702 m)  Wt 236 lb (107.049 kg)  BMI 36.95 kg/m2  SpO2 98%  LMP 05/21/2014    Objective:   Physical Exam  Constitutional: She is oriented to person, place, and time. She appears well-nourished.  HENT:  Head: Normocephalic.  Cardiovascular: Normal rate and regular rhythm.   Pulmonary/Chest: Effort normal and breath sounds normal.  Musculoskeletal:       Right knee: She exhibits decreased range of motion and swelling. She exhibits no deformity and normal alignment. No tenderness found.  Neurological: She is alert and oriented to person, place, and time.  Skin: Skin is warm and dry.  Psychiatric: She has a normal mood and affect.          Assessment & Plan:

## 2014-06-13 NOTE — Progress Notes (Signed)
Pre visit review using our clinic review tool, if applicable. No additional management support is needed unless otherwise documented below in the visit note. 

## 2014-06-13 NOTE — Assessment & Plan Note (Signed)
Suspect this is arthritis and also due to large body habitus. Xrays today. Will consider referral to ortho if needed. Ibuprofen 800 mg to be used during "flare-up's". Will continue to monitor.

## 2014-06-13 NOTE — Telephone Encounter (Signed)
Please notify Krystal Mann that she has moderate arthritis in numerous joints in her right knee. We will need to get an MRI if she can tolerate it. I will go ahead and place the order. Will she require any sedation? Thanks.

## 2014-06-13 NOTE — Assessment & Plan Note (Signed)
Long standing history. Present mildly today to bilateral hands and fold of left arm. Hydrocortisone cream recommended. She's only tried [employer] which had previously worked in the past. Will continue to monitor.

## 2014-06-13 NOTE — Patient Instructions (Addendum)
Start Clindamycin capsules for abscesses. Take 1 capsule by mouth three times daily until gone.  Complete xray(s) prior to leaving today. I will contact you regarding your results.  You will be contacted regarding your referral to General Surgery for your arm pit abscesses.  Please let us know if you have not heard back within one week.   You may take ibuprofen for your knee pain. Do not exceed more than 3 tablets in 24 hours.  Try Hydrocortisone cream, which may be purchased over the counter, for your eczema. Please call me if no improvement with this cream.  It is important that you improve your diet. Please limit carbohydrates in the form of white bread, rice, pasta, cakes, cookies, sugary drinks, etc. Increase your consumption of fresh fruits and vegetables. Be sure to drink plenty of water daily.   Please schedule a physical with me in the next 2-3 months. You will also schedule a lab only appointment one week prior. We will discuss your lab results during your physical.  It was a pleasure to meet you today! Please don't hesitate to call me with any questions. Welcome to [facility]!  High-Fiber Diet Fiber is found in fruits, vegetables, and grains. A high-fiber diet encourages the addition of more whole grains, legumes, fruits, and vegetables in your diet. The recommended amount of fiber for adult males is 38 g per day. For adult females, it is 25 g per day. Pregnant and lactating women should get 28 g of fiber per day. If you have a digestive or bowel problem, ask your caregiver for advice before adding high-fiber foods to your diet. Eat a variety of high-fiber foods instead of only a select few type of foods.  PURPOSE  To increase stool bulk.  To make bowel movements more regular to prevent constipation.  To lower cholesterol.  To prevent overeating. WHEN IS THIS DIET USED?  It may be used if you have constipation and hemorrhoids.  It may be used if you have uncomplicated  diverticulosis (intestine condition) and irritable bowel syndrome.  It may be used if you need help with weight management.  It may be used if you want to add it to your diet as a protective measure against atherosclerosis, diabetes, and cancer. SOURCES OF FIBER  Whole-grain breads and cereals.  Fruits, such as apples, oranges, bananas, berries, prunes, and pears.  Vegetables, such as green peas, carrots, sweet potatoes, beets, broccoli, cabbage, spinach, and artichokes.  Legumes, such split peas, soy, lentils.  Almonds. FIBER CONTENT IN FOODS Starches and Grains / Dietary Fiber (g)  Cheerios, 1 cup / 3 g  Corn Flakes cereal, 1 cup / 0.7 g  Rice crispy treat cereal, 1 cup / 0.3 g  Instant oatmeal (cooked),  cup / 2 g  Frosted wheat cereal, 1 cup / 5.1 g  Brown, long-grain rice (cooked), 1 cup / 3.5 g  White, long-grain rice (cooked), 1 cup / 0.6 g  Enriched macaroni (cooked), 1 cup / 2.5 g Legumes / Dietary Fiber (g)  Baked beans (canned, plain, or vegetarian),  cup / 5.2 g  Kidney beans (canned),  cup / 6.8 g  Pinto beans (cooked),  cup / 5.5 g Breads and Crackers / Dietary Fiber (g)  Plain or honey graham crackers, 2 squares / 0.7 g  Saltine crackers, 3 squares / 0.3 g  Plain, salted pretzels, 10 pieces / 1.8 g  Whole-wheat bread, 1 slice / 1.9 g  White bread, 1 slice / 0.7 g  Raisin bread, 1 slice / 1.2 g  Plain bagel, 3 oz / 2 g  Flour tortilla, 1 oz / 0.9 g  Corn tortilla, 1 small / 1.5 g  Hamburger or hotdog bun, 1 small / 0.9 g Fruits / Dietary Fiber (g)  Apple with skin, 1 medium / 4.4 g  Sweetened applesauce,  cup / 1.5 g  Banana,  medium / 1.5 g  Grapes, 10 grapes / 0.4 g  Orange, 1 small / 2.3 g  Raisin, 1.5 oz / 1.6 g  Melon, 1 cup / 1.4 g Vegetables / Dietary Fiber (g)  Green beans (canned),  cup / 1.3 g  Carrots (cooked),  cup / 2.3 g  Broccoli (cooked),  cup / 2.8 g  Peas (cooked),  cup / 4.4 g  Mashed  potatoes,  cup / 1.6 g  Lettuce, 1 cup / 0.5 g  Corn (canned),  cup / 1.6 g  Tomato,  cup / 1.1 g Document Released: 12/30/2004 Document Revised: 07/01/2011 Document Reviewed: 04/03/2011 Exit[employer] Patient Information 2015 [place]. This information is not intended to replace advice given to you by your health care provider. Make sure you discuss any questions you have with your health care provider.

## 2014-06-14 ENCOUNTER — Telehealth: Payer: Self-pay | Admitting: Primary Care

## 2014-06-14 ENCOUNTER — Other Ambulatory Visit: Payer: Self-pay | Admitting: Primary Care

## 2014-06-14 DIAGNOSIS — F40240 Claustrophobia: Secondary | ICD-10-CM

## 2014-06-14 MED ORDER — DIAZEPAM 5 MG PO TABS: ORAL_TABLET | ORAL | Status: DC

## 2014-06-14 NOTE — Telephone Encounter (Signed)
I left a message for the patient to return my call.

## 2014-06-14 NOTE — Telephone Encounter (Signed)
Pt returned your call. Please call back on cell number, thanks.

## 2014-06-14 NOTE — Telephone Encounter (Signed)
See previous phone note.  

## 2014-06-14 NOTE — Telephone Encounter (Signed)
Patient did call back and she left a message. I called the patient back. Notified patient of Kate's comments. Patient stated that she will require sedation. I inform her that she will get a call regarding the MRI. Also patient request to have a disability parking placard. The form is placed in Kate's inbox to be complete.

## 2014-06-14 NOTE — Telephone Encounter (Signed)
Her prescription for Diazepam and her Handicap Placard will be ready for pick up tomorrow morning. She will take 1 tablet 30 minutes prior to MRI. She may repeat in 1 hour if the medication is not effective. She must have a driver take her to the MRI as this medication will make her drowsy. Thanks.

## 2014-06-15 NOTE — Telephone Encounter (Signed)
Patient called back. Notified patient of Kate's comments. Rx and handicap placard application left in front office to pick up. Patient verbalized understanding.

## 2014-06-15 NOTE — Telephone Encounter (Signed)
I left a message for the patient to return my call.

## 2014-06-21 ENCOUNTER — Other Ambulatory Visit: Payer: Self-pay | Admitting: Primary Care

## 2014-06-21 ENCOUNTER — Telehealth: Payer: Self-pay | Admitting: Primary Care

## 2014-06-21 DIAGNOSIS — M25561 Pain in right knee: Principal | ICD-10-CM

## 2014-06-21 DIAGNOSIS — G8929 Other chronic pain: Secondary | ICD-10-CM

## 2014-06-21 NOTE — Telephone Encounter (Signed)
Notified Krystal Mann that her insurance company will not cover MRI until she's completed 4 weeks of physical therapy. MRI cancelled, referral made for PT. Patient verbalized understanding.

## 2014-06-24 ENCOUNTER — Other Ambulatory Visit: Payer: BC Managed Care – PPO

## 2014-07-31 ENCOUNTER — Telehealth: Payer: Self-pay | Admitting: Primary Care

## 2014-07-31 ENCOUNTER — Other Ambulatory Visit: Payer: Self-pay | Admitting: Surgery

## 2014-07-31 DIAGNOSIS — Z1211 Encounter for screening for malignant neoplasm of colon: Secondary | ICD-10-CM

## 2014-07-31 NOTE — Telephone Encounter (Signed)
Pt called wanting to get a referral to have a colonoscopy  2 of her brothers had polyps removed. And there gi dr wanted all siblings to have a colonoscopy done ASAP. She would like to go to [facility]

## 2014-08-01 ENCOUNTER — Encounter: Payer: Self-pay | Admitting: Nurse Practitioner

## 2014-08-01 ENCOUNTER — Telehealth: Payer: Self-pay | Admitting: Primary Care

## 2014-08-01 NOTE — Telephone Encounter (Signed)
Called and notified patient of Kate's comments. Patient verbalized understanding.  

## 2014-08-01 NOTE — Telephone Encounter (Signed)
Pt request call back at [phone], thanks.

## 2014-08-01 NOTE — Telephone Encounter (Signed)
Please notify Krystal Mann that I am happy to make the referral and that she will be receiving a call from our referral coordinator for scheduling. I will see her in a few weeks! Thanks!

## 2014-08-02 NOTE — Telephone Encounter (Signed)
Called and spoken to patient. Patient was hoping if possible for Krystal Mann to find another office for the colonoscopy. The office that contacted her wanted an office appointment before scheduling an colonoscopy due to her age.Patient does not understanding why she need to wait. She is very concern since her younger brother has a lot of polyps and older brother has cancer now. She wants the procedure to get done so she can ease her mind for her own health. Please advise.

## 2014-08-02 NOTE — Telephone Encounter (Signed)
Spoke with patient regarding concerns. She will go to her appointment scheduled on August 3rd.

## 2014-08-02 NOTE — Telephone Encounter (Signed)
Pt returned you call   [phone]

## 2014-08-10 ENCOUNTER — Other Ambulatory Visit: Payer: Self-pay | Admitting: Primary Care

## 2014-08-10 ENCOUNTER — Encounter: Payer: Self-pay | Admitting: Gastroenterology

## 2014-08-10 DIAGNOSIS — Z0000 Encounter for general adult medical examination without abnormal findings: Secondary | ICD-10-CM

## 2014-08-14 ENCOUNTER — Other Ambulatory Visit: Payer: BC Managed Care – PPO

## 2014-08-15 ENCOUNTER — Ambulatory Visit: Payer: BC Managed Care – PPO | Admitting: Nurse Practitioner

## 2014-08-17 ENCOUNTER — Encounter: Payer: BC Managed Care – PPO | Admitting: Primary Care

## 2014-08-30 ENCOUNTER — Ambulatory Visit: Payer: BC Managed Care – PPO | Admitting: Nurse Practitioner

## 2014-08-30 ENCOUNTER — Ambulatory Visit (INDEPENDENT_AMBULATORY_CARE_PROVIDER_SITE_OTHER): Payer: BC Managed Care – PPO | Admitting: Nurse Practitioner

## 2014-08-30 ENCOUNTER — Encounter: Payer: Self-pay | Admitting: Nurse Practitioner

## 2014-08-30 VITALS — BP 120/70 | HR 74 | Ht 67.0 in | Wt 237.8 lb

## 2014-08-30 DIAGNOSIS — Z800 Family history of malignant neoplasm of digestive organs: Secondary | ICD-10-CM | POA: Diagnosis not present

## 2014-08-30 MED ORDER — NA SULFATE-K SULFATE-MG SULF 17.5-3.13-1.6 GM/177ML PO SOLN: 1.0000 | ORAL | Status: DC

## 2014-08-30 NOTE — Patient Instructions (Signed)

## 2014-08-30 NOTE — Progress Notes (Signed)
HPI :  Patient is a 44 year old female referred by PCP for colon cancer screening. Patient states her 23 year old brother was just diagnosed with metastatic colon cancer by our practice. Another one of her brothers just underwent colon cancer screening by out practice had 21 polyps removed.  Patient has no gastrointestinal complaints. Specifically, she has no bowel changes, blood in stool, expected weight loss, etc...   Past Medical History  Diagnosis Date  . Anemia   . Depression 2004  . Arthritis     knee  . Back pain   . Rotator cuff (capsule) sprain     right  . Migraines   . Chicken pox     Family History  Problem Relation Age of Onset  . Kidney cancer Mother     malaginant cancer (kidney), skin cancer  . Diabetes Mother   . Hyperlipidemia Mother   . Lung cancer Father   . Diabetes Sister   . Asthma Sister   . Skin cancer Mother   . Colon polyps Brother 41    x 21 removed  . Liver cancer Brother 48  . Colon polyps Brother    Social History  Substance Use Topics  . Smoking status: Never Smoker   . Smokeless tobacco: Never Used  . Alcohol Use: No   Current Outpatient Prescriptions  Medication Sig Dispense Refill  . acetaminophen (TYLENOL) 650 MG CR tablet Take 650 mg by mouth as needed for pain.    . clindamycin (CLEOCIN T) 1 % lotion Apply topically 2 (two) times daily.     . diazepam (VALIUM) 5 MG tablet Take 1 tablet by mouth 30 minutes prior to MRI. May repeat in 1 hour if not effective. 2 tablet 0  . ibuprofen (ADVIL,MOTRIN) 800 MG tablet Take 1 tablet (800 mg total) by mouth every 8 (eight) hours as needed for moderate pain. 30 tablet 1  . naproxen (NAPROSYN) 500 MG tablet Take 1 tablet (500 mg total) by mouth 2 (two) times daily as needed for mild pain, moderate pain or headache (TAKE WITH MEALS.). 20 tablet 0  . OVER THE COUNTER MEDICATION Chaga , take 2 tablets every AM for inflamation    . Na Sulfate-K Sulfate-Mg Sulf (SUPREP BOWEL PREP) SOLN  Take 1 kit by mouth as directed. 324 mL 0   No current facility-administered medications for this visit.   Allergies  Allergen Reactions  . Morphine And Related Itching and Other (See Comments)    vomiting  . Hydrocodone Itching  . Hydroxyprogesterone Caproate Other (See Comments)    Severe headache    Review of Systems: All systems reviewed and negative except where noted in HPI.    Physical Exam: BP 120/70 mmHg  Pulse 74  Ht $R'5\' 7"'Gq$  (1.702 m)  Wt 237 lb 12.8 oz (107.865 kg)  BMI 37.24 kg/m2  LMP 08/07/2014 Constitutional: Pleasant, obese, black female in no acute distress. HEENT: Normocephalic and atraumatic. Conjunctivae are normal. No scleral icterus. Neck supple.  Cardiovascular: Normal rate, regular rhythm.  Pulmonary/chest: Effort normal and breath sounds normal. No wheezing, rales or rhonchi. Abdominal: Soft, nondistended, nontender. Bowel sounds active throughout. There are no masses palpable. No hepatomegaly. Extremities: no edema Lymphadenopathy: No cervical adenopathy noted. Neurological: Alert and oriented to person place and time. Skin: Skin is warm and dry. No rashes noted. Psychiatric: Normal mood and affect. Behavior is normal.   ASSESSMENT AND PLAN:  1. Pleasant 44 year old female with family history of colon cancer (1 year old brother  recently diagnosed with metastatic colon cancer). Patient has no GI complaints, no alarm symptoms. She will be scheduled for colonoscopy with Dr. Carlean Purl (he recently did brother's colonoscopy). The risks, benefits, and alternatives to colonoscopy with possible biopsy and possible polypectomy were discussed with the patient and she consents to proceed.   2. Obesity / depression      Cc: Alma Friendly, NP

## 2014-08-31 ENCOUNTER — Ambulatory Visit (AMBULATORY_SURGERY_CENTER): Payer: BC Managed Care – PPO | Admitting: Internal Medicine

## 2014-08-31 ENCOUNTER — Encounter: Payer: Self-pay | Admitting: Internal Medicine

## 2014-08-31 VITALS — BP 116/76 | HR 67 | Temp 98.7°F | Resp 15 | Ht 67.0 in | Wt 237.0 lb

## 2014-08-31 DIAGNOSIS — Z800 Family history of malignant neoplasm of digestive organs: Secondary | ICD-10-CM | POA: Diagnosis not present

## 2014-08-31 DIAGNOSIS — Z83718 Other family history of colon polyps: Secondary | ICD-10-CM | POA: Insufficient documentation

## 2014-08-31 DIAGNOSIS — D125 Benign neoplasm of sigmoid colon: Secondary | ICD-10-CM

## 2014-08-31 DIAGNOSIS — Z1211 Encounter for screening for malignant neoplasm of colon: Secondary | ICD-10-CM

## 2014-08-31 DIAGNOSIS — Z8371 Family history of colonic polyps: Secondary | ICD-10-CM | POA: Insufficient documentation

## 2014-08-31 MED ORDER — SODIUM CHLORIDE 0.9 % IV SOLN: 500.0000 mL | INTRAVENOUS | Status: DC

## 2014-08-31 NOTE — Patient Instructions (Addendum)
I found and removed one small polyp - it does not look like cancer. I will let you know pathology results and when to have another routine colonoscopy by mail.   You also have a condition called diverticulosis - common and not usually a problem. Please read the handout provided.  I appreciate the opportunity to care for you. Gatha Mayer, MD, FACG  YOU HAD AN ENDOSCOPIC PROCEDURE TODAY AT [facility]:   Refer to the procedure report that was given to you for any specific questions about what was found during the examination.  If the procedure report does not answer your questions, please call your gastroenterologist to clarify.  If you requested that your care partner not be given the details of your procedure findings, then the procedure report has been included in a sealed envelope for you to review at your convenience later.  YOU SHOULD EXPECT: Some feelings of bloating in the abdomen. Passage of more gas than usual.  Walking can help get rid of the air that was put into your GI tract during the procedure and reduce the bloating. If you had a lower endoscopy (such as a colonoscopy or flexible sigmoidoscopy) you may notice spotting of blood in your stool or on the toilet paper. If you underwent a bowel prep for your procedure, you may not have a normal bowel movement for a few days.  Please Note:  You might notice some irritation and congestion in your nose or some drainage.  This is from the oxygen used during your procedure.  There is no need for concern and it should clear up in a day or so.  SYMPTOMS TO REPORT IMMEDIATELY:   Following lower endoscopy (colonoscopy or flexible sigmoidoscopy):  Excessive amounts of blood in the stool  Significant tenderness or worsening of abdominal pains  Swelling of the abdomen that is new, acute  Fever of 100F or higher   Following upper endoscopy (EGD)  Vomiting of blood or coffee ground material  New chest pain or pain  under the shoulder blades  Painful or persistently difficult swallowing  New shortness of breath  Fever of 100F or higher  Black, tarry-looking stools  For urgent or emergent issues, a gastroenterologist can be reached at any hour by calling [phone].   DIET: Your first meal following the procedure should be a small meal and then it is ok to progress to your normal diet. Heavy or fried foods are harder to digest and may make you feel nauseous or bloated.  Likewise, meals heavy in dairy and vegetables can increase bloating.  Drink plenty of fluids but you should avoid alcoholic beverages for 24 hours.  ACTIVITY:  You should plan to take it easy for the rest of today and you should NOT DRIVE or use heavy machinery until tomorrow (because of the sedation medicines used during the test).    FOLLOW UP: Our staff will call the number listed on your records the next business day following your procedure to check on you and address any questions or concerns that you may have regarding the information given to you following your procedure. If we do not reach you, we will leave a message.  However, if you are feeling well and you are not experiencing any problems, there is no need to return our call.  We will assume that you have returned to your regular daily activities without incident.  If any biopsies were taken you will be contacted by phone  or by letter within the next 1-3 weeks.  Please call us at [phone] if you have not heard about the biopsies in 3 weeks.    SIGNATURES/CONFIDENTIALITY: You and/or your care partner have signed paperwork which will be entered into your electronic medical record.  These signatures attest to the fact that that the information above on your After Visit Summary has been reviewed and is understood.  Full responsibility of the confidentiality of this discharge information lies with you and/or your care-partner.  Polyp and diverticulosis information  given.

## 2014-08-31 NOTE — Op Note (Signed)
[place]  [address] [place], [id number]   COLONOSCOPY PROCEDURE REPORT  [name], Krystal Mann  MR#: [id number] BIRTHDATE: [birth year] , 43  yrs. old GENDER: female ENDOSCOPIST: Gatha Mayer, MD, [facility] PROCEDURE DATE:  08/31/2014 PROCEDURE:   Colonoscopy, screening and Colonoscopy with snare polypectomy First Screening Colonoscopy - Avg.  risk and is 50 yrs.  old or older Yes.  Prior Negative Screening - Now for repeat screening. N/A  History of Adenoma - Now for follow-up colonoscopy & has been > or = to 3 yrs.  N/A  Polyps removed today? Yes ASA CLASS:   Class II INDICATIONS:Screening for colonic neoplasia and FH Colon or Rectal Adenocarcinoma. MEDICATIONS: Propofol 350 mg IV, Monitored anesthesia care, and Lidocaine 40 mg IV  DESCRIPTION OF PROCEDURE:   After the risks benefits and alternatives of the procedure were thoroughly explained, informed consent was obtained.  The digital rectal exam revealed no abnormalities of the rectum.   The LB [id number]  endoscope was introduced through the anus and advanced to the cecum, which was identified by both the appendix and ileocecal valve. No adverse events experienced.   The quality of the prep was excellent. (Suprep was used)  The instrument was then slowly withdrawn as the colon was fully examined. Estimated blood loss is zero unless otherwise noted in this procedure report.      COLON FINDINGS: A sessile polyp measuring 4 mm in size was found in the sigmoid colon.  A polypectomy was performed with a cold snare. The resection was complete, the polyp tissue was completely retrieved and sent to histology.   There was mild diverticulosis noted in the sigmoid colon.   The examination was otherwise normal. Retroflexed views revealed no abnormalities. The time to cecum = 6.7 Withdrawal time = 9.7   The scope was withdrawn and the procedure completed. COMPLICATIONS: There were no immediate  complications.  ENDOSCOPIC IMPRESSION: 1.   Sessile polyp was found in the sigmoid colon; polypectomy was performed with a cold snare 2.   Mild diverticulosis was noted in the sigmoid colon 3.   The examination was otherwise normal - excellent prep - FHx CRCA and polyposis (2 brothers)  RECOMMENDATIONS: Timing of repeat colonoscopy will be determined by pathology findings.  And family genetic testing.  eSigned:  Gatha Mayer, MD, [facility] 08/31/2014 9:48 AM   cc: Alma Friendly, NP and the Patient

## 2014-08-31 NOTE — Progress Notes (Signed)
Called to room to assist during endoscopic procedure.  Patient ID and intended procedure confirmed with present staff. Received instructions for my participation in the procedure from the performing physician.  

## 2014-09-01 ENCOUNTER — Telehealth: Payer: Self-pay

## 2014-09-01 NOTE — Telephone Encounter (Signed)
  Follow up Call-  Call back number 08/31/2014  Post procedure Call Back phone  # [phone]  Permission to leave phone message Yes     Patient questions:  Do you have a fever, pain , or abdominal swelling? No. Pain Score  0 *  Have you tolerated food without any problems? Yes.    Have you been able to return to your normal activities? Yes.    Do you have any questions about your discharge instructions: Diet   No. Medications  No. Follow up visit  No.  Do you have questions or concerns about your Care? No.  Actions: * If pain score is 4 or above: No action needed, pain <4.

## 2014-09-02 NOTE — Progress Notes (Signed)
Agree with Ms. Guenther's assessment and plan. Elnita Surprenant E. Aarionna Germer, MD, FACG   

## 2014-09-11 NOTE — Progress Notes (Signed)
Quick Note:  Let her know polyp benign  Plan repeat colonoscopy 2 yrs due to this + FHx colon cancer and polyposis  I will send a letter also   ______

## 2014-09-15 ENCOUNTER — Encounter: Payer: Self-pay | Admitting: Primary Care

## 2015-01-01 ENCOUNTER — Encounter: Payer: Self-pay | Admitting: Primary Care

## 2015-01-01 ENCOUNTER — Other Ambulatory Visit (HOSPITAL_COMMUNITY)
Admission: RE | Admit: 2015-01-01 | Discharge: 2015-01-01 | Disposition: A | Discharge status: Home / Self Care | Payer: BC Managed Care – PPO | Source: Ambulatory Visit | Attending: Primary Care | Admitting: Primary Care

## 2015-01-01 ENCOUNTER — Ambulatory Visit (INDEPENDENT_AMBULATORY_CARE_PROVIDER_SITE_OTHER): Payer: BC Managed Care – PPO | Admitting: Primary Care

## 2015-01-01 VITALS — BP 122/70 | HR 79 | Temp 98.5°F | Ht 67.0 in | Wt 232.0 lb

## 2015-01-01 DIAGNOSIS — Z01419 Encounter for gynecological examination (general) (routine) without abnormal findings: Secondary | ICD-10-CM | POA: Diagnosis present

## 2015-01-01 DIAGNOSIS — Z0001 Encounter for general adult medical examination with abnormal findings: Secondary | ICD-10-CM | POA: Insufficient documentation

## 2015-01-01 DIAGNOSIS — R059 Cough, unspecified: Secondary | ICD-10-CM

## 2015-01-01 DIAGNOSIS — Z1151 Encounter for screening for human papillomavirus (HPV): Secondary | ICD-10-CM | POA: Insufficient documentation

## 2015-01-01 DIAGNOSIS — R05 Cough: Secondary | ICD-10-CM | POA: Diagnosis not present

## 2015-01-01 DIAGNOSIS — Z124 Encounter for screening for malignant neoplasm of cervix: Secondary | ICD-10-CM

## 2015-01-01 DIAGNOSIS — M25561 Pain in right knee: Secondary | ICD-10-CM

## 2015-01-01 DIAGNOSIS — G8929 Other chronic pain: Secondary | ICD-10-CM

## 2015-01-01 DIAGNOSIS — Z0000 Encounter for general adult medical examination without abnormal findings: Secondary | ICD-10-CM | POA: Diagnosis not present

## 2015-01-01 LAB — COMPREHENSIVE METABOLIC PANEL
ALBUMIN: 3.9 g/dL (ref 3.5–5.2)
ALK PHOS: 93 U/L (ref 39–117)
ALT: 8 U/L (ref 0–35)
AST: 13 U/L (ref 0–37)
BILIRUBIN TOTAL: 0.7 mg/dL (ref 0.2–1.2)
BUN: 8 mg/dL (ref 6–23)
CALCIUM: 9.5 mg/dL (ref 8.4–10.5)
CO2: 28 meq/L (ref 19–32)
CREATININE: 0.63 mg/dL (ref 0.40–1.20)
Chloride: 105 mEq/L (ref 96–112)
GFR: 130.66 mL/min (ref >60.00)
Glucose, Bld: 89 mg/dL (ref 70–99)
Potassium: 3.9 mEq/L (ref 3.5–5.1)
Sodium: 140 mEq/L (ref 135–145)
TOTAL PROTEIN: 7.4 g/dL (ref 6.0–8.3)

## 2015-01-01 LAB — CBC
HCT: 39.8 % (ref 36.0–46.0)
HEMOGLOBIN: 12.8 g/dL (ref 12.0–15.0)
MCHC: 32.2 g/dL (ref 30.0–36.0)
MCV: 87 fl (ref 78.0–100.0)
PLATELETS: 327 10*3/uL (ref 150.0–400.0)
RBC: 4.57 Mil/uL (ref 3.87–5.11)
RDW: 14.6 % (ref 11.5–15.5)
WBC: 4.7 10*3/uL (ref 4.0–10.5)

## 2015-01-01 LAB — LIPID PANEL
CHOL/HDL RATIO: 2
Cholesterol: 124 mg/dL (ref 0–200)
HDL: 51 mg/dL (ref >39.00)
LDL Cholesterol: 57 mg/dL (ref 0–99)
NonHDL: 73.34
TRIGLYCERIDES: 84 mg/dL (ref 0.0–149.0)
VLDL: 16.8 mg/dL (ref 0.0–40.0)

## 2015-01-01 LAB — TSH: TSH: 1.19 u[IU]/mL (ref 0.35–4.50)

## 2015-01-01 LAB — VITAMIN D 25 HYDROXY (VIT D DEFICIENCY, FRACTURES): VITD: 10.18 ng/mL — AB (ref 30.00–100.00)

## 2015-01-01 LAB — HEMOGLOBIN A1C: Hgb A1c MFr Bld: 5.8 % (ref 4.6–6.5)

## 2015-01-01 MED ORDER — BENZONATATE 200 MG PO CAPS: 200.0000 mg | ORAL_CAPSULE | Freq: Three times a day (TID) | ORAL | Status: DC | PRN

## 2015-01-01 MED ORDER — AZITHROMYCIN 250 MG PO TABS: ORAL_TABLET | ORAL | Status: DC

## 2015-01-01 NOTE — Assessment & Plan Note (Addendum)
Right knee pain, continues. Could not tolerate PT due to pain. Xrays completed in May. No improvement with OTC meds. Will order and schedule MRI Significant decrease in ROM with pain today.

## 2015-01-01 NOTE — Patient Instructions (Addendum)
Complete lab work prior to leaving today. I will notify you of your results once received.   Start Azithromycin antibiotics. Take 2 tablets by mouth today, then 1 tablet daily for 4 additional days.  You may take Benzonatate capsules for cough. Take 1 capsule by mouth three times daily as needed for cough.  Increase fluids and rest.  We will notify you of your pap results within the next several days.  Stop by the front and speak with [facility] regarding your MRI.  Follow up in 1 year for repeat physical or sooner if needed.  It was a pleasure to see you today!

## 2015-01-01 NOTE — Progress Notes (Signed)
Pre visit review using our clinic review tool, if applicable. No additional management support is needed unless otherwise documented below in the visit note. 

## 2015-01-01 NOTE — Assessment & Plan Note (Signed)
Tdap due, will administer next visit. Declines flu. Due for Pap and mammogram. Pap completed today, mammogram handout provided. Exam mostly unremarkable with significant decrease in ROM to right knee. Will schedule MRI. Labs pending.

## 2015-01-01 NOTE — Progress Notes (Signed)
Subjective:    Patient ID: Krystal Mann, female    DOB: [birth year], 44 y.o.   MRN: [id number]  HPI  Ms. Krystal Mann is a 44 year old female who presents today for complete physical.  Immunizations: -Tetanus: Unsure, has not completed in 10 years.  -Influenza: Declines.  Diet: Endorses a fair diet. Breakfast: Omlette ([place] sausage, spinich, onions), juice Lunch: Skips Dinner: Salad, pasta, vegetables, pizza Snacks: Fruit, popcorn Desserts: Cookies and ice cream occasionally Beverages: Juice, water, occasional sodas.  Wt Readings from Last 3 Encounters:  01/01/15 232 lb (105.235 kg)  08/31/14 237 lb (107.502 kg)  08/30/14 237 lb 12.8 oz (107.865 kg)     Exercise: She is not currently exercising, due to right knee pain. Eye exam: Completed several years ago. Due. Dental exam: Not completed recently. Plans on going after January 2017. Colonoscopy: Completed in 2016. Diverticulitis found. Pap Smear: Completed in 2012. Due.  Mammogram: Last completed in 2014, due.   1) Knee pain: Longstanding history of knee pain for the past several years. She underwent imaging with xray and completed PT but could not tolerate due to pain. She continues to have difficulty fully extending her knee, and will require the use of a cane for ambulation. She has been driving with her left lower extremity rather than her right due to pain. She's been applying heat and ice, soaking in a tub of epsom salt, taking ibuprofen and tylenol, overall without improvement.   2) Nasal congestion: Also with cough, chest congestion, sore throat, sinus pressure. Denies fevers. She taken Flonase, thera- flu with temporary relief. Her symptoms began Wednesday last week. She's had to miss work since Thursday morning due to symptoms. Overall feeling worse. 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    Review of Systems  Constitutional: Negative for fever and chills.  HENT:  Positive for congestion, sinus pressure and sore throat.   Respiratory: Positive for cough. Negative for shortness of breath.   Cardiovascular: Negative for chest pain.  Gastrointestinal: Negative for diarrhea and constipation.       Bowel movements once weekly.  Genitourinary: Negative for difficulty urinating.       Regular periods  Musculoskeletal: Positive for arthralgias.       Chronic knee right knee pain  Skin: Negative for rash.  Neurological: Positive for headaches. Negative for dizziness and numbness.  Psychiatric/Behavioral:       Denies concerns for anxiety or depression       Past Medical History  Diagnosis Date  . Anemia   . Depression 2004  . Arthritis     knee  . Back pain   . Rotator cuff (capsule) sprain     right  . Migraines   . Chicken pox   . Diverticulitis     Social History   Social History  . Marital Status: Single    Spouse Name: N/A  . Number of Children: 2  . Years of Education: N/A   Occupational History  . 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 Smoking status: Never Smoker   . Smokeless tobacco: Never Used  . Alcohol Use: No  . Drug Use: No  . Sexual Activity:    Partners: Male    Birth Control/ Protection: Surgical     Comment: tubial ligation   Other Topics Concern  . Not on file   Social History  Narrative   Single.   2 Children, 1 grandson.   Works as a Teacher, early years/pre.   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    Past Surgical History  Procedure Laterality Date  . Cervical discectomy  2012  . Tubal ligation      Family History  Problem Relation Age of Onset  . Kidney cancer Mother     malaginant cancer (kidney), skin cancer  . Diabetes Mother   . Hyperlipidemia Mother   . Lung cancer Father   . Diabetes Sister   . Asthma Sister   . Skin cancer Mother   . Colon polyps Brother 41    x 21 removed  . Liver cancer Brother 16  .  Colon polyps Brother     Allergies  Allergen Reactions  . Morphine And Related Itching and Other (See Comments)    vomiting  . Hydrocodone Itching  . Hydroxyprogesterone Caproate Other (See Comments)    Severe headache    Current Outpatient Prescriptions on File Prior to Visit  Medication Sig Dispense Refill  . acetaminophen (TYLENOL) 650 MG CR tablet Take 650 mg by mouth as needed for pain.    . clindamycin (CLEOCIN T) 1 % lotion Apply topically 2 (two) times daily.     Marland Kitchen ibuprofen (ADVIL,MOTRIN) 800 MG tablet Take 1 tablet (800 mg total) by mouth every 8 (eight) hours as needed for moderate pain. 30 tablet 1  . OVER THE COUNTER MEDICATION Chaga , take 2 tablets every AM for inflamation    . diazepam (VALIUM) 5 MG tablet Take 1 tablet by mouth 30 minutes prior to MRI. May repeat in 1 hour if not effective. (Patient not taking: Reported on 08/31/2014) 2 tablet 0  . naproxen (NAPROSYN) 500 MG tablet Take 1 tablet (500 mg total) by mouth 2 (two) times daily as needed for mild pain, moderate pain or headache (TAKE WITH MEALS.). (Patient not taking: Reported on 08/31/2014) 20 tablet 0   No current facility-administered medications on file prior to visit.    BP 122/70 mmHg  Pulse 79  Temp(Src) 98.5 F (36.9 C) (Oral)  Ht 5\' 7"  (1.702 m)  Wt 232 lb (105.235 kg)  BMI 36.33 kg/m2  SpO2 98%  LMP 12/15/2014    Objective:   Physical Exam  Constitutional: She is oriented to person, place, and time. She appears well-nourished.  HENT:  Right Ear: Tympanic membrane and ear canal normal.  Left Ear: Tympanic membrane and ear canal normal.  Nose: Mucosal edema present. Right sinus exhibits maxillary sinus tenderness. Right sinus exhibits no frontal sinus tenderness. Left sinus exhibits maxillary sinus tenderness. Left sinus exhibits no frontal sinus tenderness.  Mouth/Throat: Posterior oropharyngeal erythema present. No oropharyngeal exudate or posterior oropharyngeal edema.  Eyes:  Conjunctivae and EOM are normal. Pupils are equal, round, and reactive to light.  Neck: Neck supple. No thyromegaly present.  Cardiovascular: Normal rate and regular rhythm.   No murmur heard. Pulmonary/Chest: Effort normal and breath sounds normal. She has no rales.  Abdominal: Soft. Bowel sounds are normal. There is no tenderness.  Musculoskeletal:       Right knee: She exhibits decreased range of motion. She exhibits no swelling, no deformity and normal alignment. Tenderness found.  Significant decrease in ROM, unable to completely extend.   Lymphadenopathy:    She has no cervical adenopathy.  Neurological: She is alert and oriented to person, place, and time. She has normal reflexes. No cranial nerve deficit.  Skin: Skin is warm and dry. No rash noted.  Psychiatric: She has a normal mood and affect.          Assessment & Plan:  URI:  Nasal congestion, cough, sinus pressure since Wednesday last week. Now with increase in symptoms.  Exam mostly unremarkable except for moderate sinus tenderness to maxillary sinuses. Lungs clear. Will print Zpak RX to start Wednesday if symptoms are not improved. Will treat with supportive measures including tessalon pearls, flonase, advil PRN.

## 2015-01-02 ENCOUNTER — Other Ambulatory Visit: Payer: Self-pay | Admitting: Primary Care

## 2015-01-02 DIAGNOSIS — E559 Vitamin D deficiency, unspecified: Secondary | ICD-10-CM

## 2015-01-02 MED ORDER — VITAMIN D (ERGOCALCIFEROL) 1.25 MG (50000 UNIT) PO CAPS: ORAL_CAPSULE | ORAL | Status: DC

## 2015-01-03 LAB — CYTOLOGY - PAP

## 2015-01-04 ENCOUNTER — Encounter: Payer: Self-pay | Admitting: *Deleted

## 2015-01-08 ENCOUNTER — Other Ambulatory Visit: Payer: Self-pay | Admitting: Primary Care

## 2015-01-08 DIAGNOSIS — F40240 Claustrophobia: Secondary | ICD-10-CM

## 2015-01-09 ENCOUNTER — Other Ambulatory Visit: Payer: Self-pay | Admitting: Primary Care

## 2015-01-09 ENCOUNTER — Ambulatory Visit
Admission: RE | Admit: 2015-01-09 | Discharge: 2015-01-09 | Disposition: A | Discharge status: Home / Self Care | Payer: BC Managed Care – PPO | Source: Ambulatory Visit | Attending: Primary Care | Admitting: Primary Care

## 2015-01-09 ENCOUNTER — Telehealth: Payer: Self-pay | Admitting: *Deleted

## 2015-01-09 ENCOUNTER — Telehealth: Payer: Self-pay | Admitting: Primary Care

## 2015-01-09 DIAGNOSIS — S83206A Unspecified tear of unspecified meniscus, current injury, right knee, initial encounter: Secondary | ICD-10-CM

## 2015-01-09 DIAGNOSIS — M25561 Pain in right knee: Secondary | ICD-10-CM

## 2015-01-09 DIAGNOSIS — G8929 Other chronic pain: Secondary | ICD-10-CM

## 2015-01-09 MED ORDER — TRAMADOL HCL 50 MG PO TABS: 50.0000 mg | ORAL_TABLET | Freq: Three times a day (TID) | ORAL | Status: DC | PRN

## 2015-01-09 MED ORDER — DIAZEPAM 5 MG PO TABS: ORAL_TABLET | ORAL | Status: DC

## 2015-01-09 MED ORDER — IBUPROFEN 800 MG PO TABS: 800.0000 mg | ORAL_TABLET | Freq: Three times a day (TID) | ORAL | Status: DC | PRN

## 2015-01-09 NOTE — Telephone Encounter (Signed)
rx called into pharmacy, for tramadol

## 2015-01-09 NOTE — Telephone Encounter (Signed)
Just saw this message, RX being called into her pharmacy now at 12:14pm. Morey Hummingbird, please notify me if something like this occurs again as I do not get a chance to check my in basket during clinic times. Will you please call the patient and apologize for [place]? Thanks!

## 2015-01-09 NOTE — Telephone Encounter (Signed)
Lm on pts vm. rx called in to requested pharmacy

## 2015-01-09 NOTE — Telephone Encounter (Signed)
Patient is having an MRI done today at 12:00.  Patient said a rx for Valium was suppose to be sent to [facility]..  Please call patient when rx is called in to pharmacy.

## 2015-01-09 NOTE — Telephone Encounter (Signed)
Pt had 2tabs given for a procedure in 06/2014. pls advise

## 2015-01-09 NOTE — Telephone Encounter (Signed)
Rx called in to requested pharmacy. Ok per [facility], although procedure was at 1200

## 2015-03-08 ENCOUNTER — Emergency Department (HOSPITAL_BASED_OUTPATIENT_CLINIC_OR_DEPARTMENT_OTHER)
Admission: EM | Admit: 2015-03-08 | Discharge: 2015-03-08 | Disposition: A | Discharge status: Home / Self Care | Payer: BC Managed Care – PPO | Attending: Emergency Medicine | Admitting: Emergency Medicine

## 2015-03-08 ENCOUNTER — Encounter (HOSPITAL_BASED_OUTPATIENT_CLINIC_OR_DEPARTMENT_OTHER): Payer: Self-pay | Admitting: *Deleted

## 2015-03-08 ENCOUNTER — Emergency Department (HOSPITAL_BASED_OUTPATIENT_CLINIC_OR_DEPARTMENT_OTHER): Payer: BC Managed Care – PPO

## 2015-03-08 DIAGNOSIS — J069 Acute upper respiratory infection, unspecified: Secondary | ICD-10-CM | POA: Diagnosis not present

## 2015-03-08 DIAGNOSIS — Z862 Personal history of diseases of the blood and blood-forming organs and certain disorders involving the immune mechanism: Secondary | ICD-10-CM | POA: Insufficient documentation

## 2015-03-08 DIAGNOSIS — Z7951 Long term (current) use of inhaled steroids: Secondary | ICD-10-CM | POA: Diagnosis not present

## 2015-03-08 DIAGNOSIS — F329 Major depressive disorder, single episode, unspecified: Secondary | ICD-10-CM | POA: Insufficient documentation

## 2015-03-08 DIAGNOSIS — Z8719 Personal history of other diseases of the digestive system: Secondary | ICD-10-CM | POA: Diagnosis not present

## 2015-03-08 DIAGNOSIS — Z8679 Personal history of other diseases of the circulatory system: Secondary | ICD-10-CM | POA: Insufficient documentation

## 2015-03-08 DIAGNOSIS — Z79899 Other long term (current) drug therapy: Secondary | ICD-10-CM | POA: Insufficient documentation

## 2015-03-08 DIAGNOSIS — Z8619 Personal history of other infectious and parasitic diseases: Secondary | ICD-10-CM | POA: Diagnosis not present

## 2015-03-08 DIAGNOSIS — Z8739 Personal history of other diseases of the musculoskeletal system and connective tissue: Secondary | ICD-10-CM | POA: Insufficient documentation

## 2015-03-08 DIAGNOSIS — R51 Headache: Secondary | ICD-10-CM | POA: Diagnosis not present

## 2015-03-08 DIAGNOSIS — R05 Cough: Secondary | ICD-10-CM | POA: Diagnosis present

## 2015-03-08 MED ORDER — FLUTICASONE PROPIONATE 50 MCG/ACT NA SUSP
2.0000 | Freq: Every day | NASAL | Status: DC
Start: 2015-03-08 — End: 2020-08-02

## 2015-03-08 MED ORDER — BENZONATATE 100 MG PO CAPS: 100.0000 mg | ORAL_CAPSULE | Freq: Three times a day (TID) | ORAL | Status: DC | PRN

## 2015-03-08 NOTE — Discharge Instructions (Signed)
1. Medications: flonase, mucinex as needed for nasal congestion, tessalon as needed for cough, continue usual home medications 2. Treatment: rest, drink plenty of fluids, take tylenol or ibuprofen for fever control if needed 3. Follow Up: Please follow up with your primary doctor in 3 days for discussion of your diagnoses and further evaluation after today's visit; Return to the ER for high fevers, difficulty breathing or other concerning symptoms

## 2015-03-08 NOTE — ED Notes (Signed)
Cough, headache, runny nose and sneezing x 4 days.

## 2015-03-08 NOTE — ED Provider Notes (Signed)
CSN: [id number]     Arrival date & time 03/08/15  1416 History   First MD Initiated Contact with Patient 03/08/15 1453     Chief Complaint  Patient presents with  . URI     (Consider location/radiation/quality/duration/timing/severity/associated sxs/prior Treatment) Patient is a 45 y.o. female presenting with URI. The history is provided by the patient and medical records. No language interpreter was used.  URI Presenting symptoms: cough, rhinorrhea and sore throat   Presenting symptoms: no fever   Associated symptoms: headaches and sneezing   Associated symptoms: no neck pain and no wheezing    Krystal Mann is a 45 y.o. female  who presents to the Emergency Department complaining of persistent URI symptoms x 4 days. Symptoms include dry cough which is worse at night, headache, runny nose, and sore throat. She has tried dayquil/nyquil with little relief. Admits to nausea. Denies vomiting, fevers, shortness of breath.  Past Medical History  Diagnosis Date  . Anemia   . Depression 2004  . Arthritis     knee  . Back pain   . Rotator cuff (capsule) sprain     right  . Migraines   . Chicken pox   . Diverticulitis    Past Surgical History  Procedure Laterality Date  . Cervical discectomy  2012  . Tubal ligation     Family History  Problem Relation Age of Onset  . Kidney cancer Mother     malaginant cancer (kidney), skin cancer  . Diabetes Mother   . Hyperlipidemia Mother   . Lung cancer Father   . Diabetes Sister   . Asthma Sister   . Skin cancer Mother   . Colon polyps Brother 41    x 21 removed  . Liver cancer Brother 55  . Colon polyps Brother    Social History  Substance Use Topics  . Smoking status: Never Smoker   . Smokeless tobacco: Never Used  . Alcohol Use: No   OB History    Gravida Para Term Preterm AB TAB SAB Ectopic Multiple Living   3 2 1 1 1  1   2      Review of Systems  Constitutional: Negative for fever and chills.  HENT: Positive for  rhinorrhea, sneezing and sore throat.   Eyes: Negative for visual disturbance.  Respiratory: Positive for cough. Negative for shortness of breath and wheezing.   Cardiovascular: Negative.   Gastrointestinal: Negative for nausea, vomiting and abdominal pain.  Genitourinary: Negative for dysuria.  Musculoskeletal: Negative for back pain and neck pain.  Skin: Negative for rash.  Neurological: Positive for headaches.      Allergies  Morphine and related; Hydrocodone; and Hydroxyprogesterone caproate  Home Medications   Prior to Admission medications   Medication Sig Start Date End Date Taking? Authorizing Provider  acetaminophen (TYLENOL) 650 MG CR tablet Take 650 mg by mouth as needed for pain.    Historical Provider, MD  azithromycin (ZITHROMAX Z-PAK) 250 MG tablet Take 2 tablets by mouth today, then 1 tablet daily for 4 additional days. 01/01/15   Pleas Koch, NP  benzonatate (TESSALON) 100 MG capsule Take 1 capsule (100 mg total) by mouth 3 (three) times daily as needed for cough. 03/08/15   Ozella Almond Ward, PA-C  clindamycin (CLEOCIN T) 1 % lotion Apply topically 2 (two) times daily.  08/07/14   Historical Provider, MD  diazepam (VALIUM) 5 MG tablet Take 1 tablet by mouth 30 minutes prior to MRI. May repeat in 1  hour if not effective. 01/09/15   Pleas Koch, NP  fluticasone (FLONASE) 50 MCG/ACT nasal spray Place 2 sprays into both nostrils daily. 03/08/15   Ozella Almond Ward, PA-C  ibuprofen (ADVIL,MOTRIN) 800 MG tablet Take 1 tablet (800 mg total) by mouth every 8 (eight) hours as needed for moderate pain. 01/09/15   Pleas Koch, NP  OVER THE COUNTER MEDICATION [place] , take 2 tablets every AM for inflamation    Historical Provider, MD  traMADol (ULTRAM) 50 MG tablet Take 1 tablet (50 mg total) by mouth every 8 (eight) hours as needed. 01/09/15   Pleas Koch, NP  Vitamin D, Ergocalciferol, (DRISDOL) 50000 UNITS CAPS capsule Take 1 capsule by mouth once weekly  for 12 weeks. 01/02/15   Pleas Koch, NP   BP 122/86 mmHg  Pulse 94  Temp(Src) 99 F (37.2 C) (Oral)  Resp 20  Ht 5\' 7"  (1.702 m)  Wt 102.059 kg  BMI 35.23 kg/m2  SpO2 98%  LMP 02/21/2015 Physical Exam  Constitutional: She is oriented to person, place, and time. She appears well-developed and well-nourished.  Alert and in no acute distress  HENT:  Head: Normocephalic and atraumatic.  OP with erythema, no tonsillar hypertrophy, no exudates. +PND.   Neck: Normal range of motion. Neck supple.  Cardiovascular: Normal rate, regular rhythm and normal heart sounds.  Exam reveals no gallop and no friction rub.   No murmur heard. Pulmonary/Chest: Effort normal and breath sounds normal. No respiratory distress. She has no wheezes. She has no rales. She exhibits no tenderness.  Abdominal: Soft. She exhibits no distension. There is no tenderness.  Lymphadenopathy:    She has cervical adenopathy.  Neurological: She is alert and oriented to person, place, and time.  Skin: Skin is warm and dry. No rash noted.  Nursing note and vitals reviewed.   ED Course  Procedures (including critical care time) Labs Review Labs Reviewed - No data to display  Imaging Review Dg Chest 2 View  03/08/2015  CLINICAL DATA:  Cough EXAM: CHEST  2 VIEW COMPARISON:  12/16/2013 FINDINGS: Normal heart size and mediastinal contours. No acute infiltrate or edema. No effusion or pneumothorax. No acute osseous findings. IMPRESSION: No active cardiopulmonary disease. Electronically Signed   By: Monte Fantasia M.D.   On: 03/08/2015 14:52   I have personally reviewed and evaluated these images and lab results as part of my medical decision-making.   EKG Interpretation None      MDM   Final diagnoses:  URI (upper respiratory infection)   Krystal Mann is afebrile, non-toxic appearing with a clear lung exam. Mild rhinorrhea and OP with erythema, no exudates. CXR with no acute cardiopulmonary disease. Likely  viral URI. Patient is agreeable to symptomatic treatment with close follow up with PCP as needed but spoke at length about emergent, changing, or worsening of symptoms that should prompt return to ER. Patient voices understanding and is agreeable to plan.   Blood pressure 122/86, pulse 94, temperature 99 F (37.2 C), temperature source Oral, resp. rate 20, height 5\' 7"  (1.702 m), weight 102.059 kg, last menstrual period 02/21/2015, SpO2 98 %.   [facility] Ward, PA-C 03/08/15 Walker Yao, MD 03/09/15 [phone]

## 2015-03-27 ENCOUNTER — Emergency Department (HOSPITAL_COMMUNITY)
Admission: EM | Admit: 2015-03-27 | Discharge: 2015-03-28 | Disposition: A | Discharge status: Home / Self Care | Payer: BC Managed Care – PPO | Attending: Emergency Medicine | Admitting: Emergency Medicine

## 2015-03-27 DIAGNOSIS — Z792 Long term (current) use of antibiotics: Secondary | ICD-10-CM | POA: Diagnosis not present

## 2015-03-27 DIAGNOSIS — Z8619 Personal history of other infectious and parasitic diseases: Secondary | ICD-10-CM | POA: Diagnosis not present

## 2015-03-27 DIAGNOSIS — R52 Pain, unspecified: Secondary | ICD-10-CM | POA: Diagnosis not present

## 2015-03-27 DIAGNOSIS — M179 Osteoarthritis of knee, unspecified: Secondary | ICD-10-CM | POA: Diagnosis not present

## 2015-03-27 DIAGNOSIS — Z8719 Personal history of other diseases of the digestive system: Secondary | ICD-10-CM | POA: Diagnosis not present

## 2015-03-27 DIAGNOSIS — Z8679 Personal history of other diseases of the circulatory system: Secondary | ICD-10-CM | POA: Diagnosis not present

## 2015-03-27 DIAGNOSIS — Z7951 Long term (current) use of inhaled steroids: Secondary | ICD-10-CM | POA: Diagnosis not present

## 2015-03-27 DIAGNOSIS — R42 Dizziness and giddiness: Secondary | ICD-10-CM | POA: Insufficient documentation

## 2015-03-27 DIAGNOSIS — Z862 Personal history of diseases of the blood and blood-forming organs and certain disorders involving the immune mechanism: Secondary | ICD-10-CM | POA: Insufficient documentation

## 2015-03-27 DIAGNOSIS — R05 Cough: Secondary | ICD-10-CM | POA: Diagnosis not present

## 2015-03-27 DIAGNOSIS — R110 Nausea: Secondary | ICD-10-CM | POA: Insufficient documentation

## 2015-03-27 DIAGNOSIS — F329 Major depressive disorder, single episode, unspecified: Secondary | ICD-10-CM | POA: Insufficient documentation

## 2015-03-27 DIAGNOSIS — R270 Ataxia, unspecified: Secondary | ICD-10-CM

## 2015-03-27 DIAGNOSIS — M791 Myalgia: Secondary | ICD-10-CM | POA: Insufficient documentation

## 2015-03-27 LAB — CBC WITH DIFFERENTIAL/PLATELET
BASOS ABS: 0 10*3/uL (ref 0.0–0.1)
Basophils Relative: 0 %
EOS PCT: 2 %
Eosinophils Absolute: 0.1 10*3/uL (ref 0.0–0.7)
HEMATOCRIT: 35 % — AB (ref 36.0–46.0)
Hemoglobin: 11.7 g/dL — ABNORMAL LOW (ref 12.0–15.0)
LYMPHS ABS: 1.6 10*3/uL (ref 0.7–4.0)
LYMPHS PCT: 30 %
MCH: 28.3 pg (ref 26.0–34.0)
MCHC: 33.4 g/dL (ref 30.0–36.0)
MCV: 84.5 fL (ref 78.0–100.0)
MONO ABS: 0.6 10*3/uL (ref 0.1–1.0)
Monocytes Relative: 11 %
NEUTROS ABS: 3 10*3/uL (ref 1.7–7.7)
Neutrophils Relative %: 57 %
PLATELETS: 293 10*3/uL (ref 150–400)
RBC: 4.14 MIL/uL (ref 3.87–5.11)
RDW: 13.9 % (ref 11.5–15.5)
WBC: 5.3 10*3/uL (ref 4.0–10.5)

## 2015-03-27 LAB — URINALYSIS, ROUTINE W REFLEX MICROSCOPIC
BILIRUBIN URINE: NEGATIVE
GLUCOSE, UA: NEGATIVE mg/dL
KETONES UR: NEGATIVE mg/dL
LEUKOCYTES UA: NEGATIVE
NITRITE: NEGATIVE
PROTEIN: NEGATIVE mg/dL
Specific Gravity, Urine: 1.016 (ref 1.005–1.030)
pH: 6 (ref 5.0–8.0)

## 2015-03-27 LAB — BASIC METABOLIC PANEL
Anion gap: 8 (ref 5–15)
BUN: 8 mg/dL (ref 6–20)
CO2: 28 mmol/L (ref 22–32)
Calcium: 9.3 mg/dL (ref 8.9–10.3)
Chloride: 106 mmol/L (ref 101–111)
Creatinine, Ser: 0.7 mg/dL (ref 0.44–1.00)
GFR calc Af Amer: >60 mL/min (ref >60)
GLUCOSE: 113 mg/dL — AB (ref 65–99)
POTASSIUM: 3.8 mmol/L (ref 3.5–5.1)
Sodium: 142 mmol/L (ref 135–145)

## 2015-03-27 LAB — URINE MICROSCOPIC-ADD ON

## 2015-03-27 MED ORDER — SODIUM CHLORIDE 0.9 % IV BOLUS (SEPSIS)
1000.0000 mL | Freq: Once | INTRAVENOUS | Status: AC
  Administered 2015-03-27: 1000 mL via INTRAVENOUS

## 2015-03-27 MED ORDER — MECLIZINE HCL 25 MG PO TABS
50.0000 mg | ORAL_TABLET | Freq: Once | ORAL | Status: AC
  Administered 2015-03-27: 50 mg via ORAL
  Filled 2015-03-27: qty 2

## 2015-03-27 NOTE — ED Notes (Signed)
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 Pt reports sudden onset of symptoms. Denies any vomiting or diarrhea. Also reporting body aches and chills. 22ga IV to right hand by ems saline locked. 4mg  Zofran administered by EMS prior to arrival.

## 2015-03-27 NOTE — ED Notes (Signed)
Pt ambulated in room and still having dizziness in which pt states that "room is slanted" pt also reporting nausea.

## 2015-03-27 NOTE — ED Provider Notes (Signed)
CSN: [id number]     Arrival date & time 03/27/15  1939 History   First MD Initiated Contact with Patient 03/27/15 2020     Chief Complaint  Patient presents with  . 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 Patient states that she has a spinning sensation that is worse with positioning and movement of her head. She denies headache, URI symptoms, fever. Patient also also had generalized body aches and general malaise. She has a residual cough from her URI which is not worse than it has been. No abdominal pain, diarrhea, urinary symptoms. No skin rashes. No new medications or medication changes. Patient is been eating and drinking well. She has never had vertigo before. No upper extremity or lower extremity weakness, slurred speech, difficulty walking or talking. No history of stroke. Patient does state that her left arm feels like someone has been punching it. Course is constant. Nothing makes symptoms better.  Patient is a 45 y.o. female presenting with flu symptoms. The history is provided by the patient.  Influenza Presenting symptoms: cough, myalgias and nausea   Presenting symptoms: no diarrhea, no fever, no headaches, no rhinorrhea, no shortness of breath, no sore throat and no vomiting   Associated symptoms: no chills     Past Medical History  Diagnosis Date  . Anemia   . Depression 2004  . Arthritis     knee  . Back pain   . Rotator cuff (capsule) sprain     right  . Migraines   . Chicken pox   . Diverticulitis    Past Surgical History  Procedure Laterality Date  . Cervical discectomy  2012  . Tubal ligation     Family History  Problem Relation Age of Onset  . Kidney cancer Mother     malaginant cancer (kidney), skin cancer  . Diabetes  Mother   . Hyperlipidemia Mother   . Lung cancer Father   . Diabetes Sister   . Asthma Sister   . Skin cancer Mother   . Colon polyps Brother 41    x 21 removed  . Liver cancer Brother 62  . Colon polyps Brother    Social History  Substance Use Topics  . Smoking status: Never Smoker   . Smokeless tobacco: Never Used  . Alcohol Use: No   OB History    Gravida Para Term Preterm AB TAB SAB Ectopic Multiple Living   3 2 1 1 1  1   2      Review of Systems  Constitutional: Negative for fever and chills.  HENT: Negative for rhinorrhea and sore throat.   Eyes: Negative for redness.  Respiratory: Positive for cough. Negative for shortness of breath.   Cardiovascular: Negative for chest pain.  Gastrointestinal: Positive for nausea. Negative for vomiting, abdominal pain and diarrhea.  Genitourinary: Negative for dysuria.  Musculoskeletal: Positive for myalgias.  Skin: Negative for rash.  Neurological: Positive for dizziness. Negative for headaches.    Allergies  Morphine and related; Hydrocodone; and Hydroxyprogesterone caproate  Home Medications   Prior to Admission medications   Medication Sig Start Date End Date Taking? Authorizing Provider  clindamycin (CLEOCIN T) 1 % lotion Apply topically 2 (two) times daily.  08/07/14  Yes Historical Provider, MD  meloxicam (MOBIC) 15 MG tablet Take 15 mg by mouth daily as needed for  pain.  03/27/15  Yes Historical Provider, MD  traMADol (ULTRAM) 50 MG tablet Take 1 tablet (50 mg total) by mouth every 8 (eight) hours as needed. 01/09/15  Yes Pleas Koch, NP  Vitamin D, Ergocalciferol, (DRISDOL) 50000 UNITS CAPS capsule Take 1 capsule by mouth once weekly for 12 weeks. 01/02/15  Yes Pleas Koch, NP  acetaminophen (TYLENOL) 650 MG CR tablet Take 650 mg by mouth as needed for pain.    Historical Provider, MD  azithromycin (ZITHROMAX Z-PAK) 250 MG tablet Take 2 tablets by mouth today, then 1 tablet daily for 4 additional days.  01/01/15   Pleas Koch, NP  benzonatate (TESSALON) 100 MG capsule Take 1 capsule (100 mg total) by mouth 3 (three) times daily as needed for cough. 03/08/15   Ozella Almond Ward, PA-C  fluticasone (FLONASE) 50 MCG/ACT nasal spray Place 2 sprays into both nostrils daily. 03/08/15   Ozella Almond Ward, PA-C  ibuprofen (ADVIL,MOTRIN) 800 MG tablet Take 1 tablet (800 mg total) by mouth every 8 (eight) hours as needed for moderate pain. 01/09/15   Pleas Koch, NP  OVER THE COUNTER MEDICATION [place] , take 2 tablets every AM for inflamation    Historical Provider, MD   BP 118/80 mmHg  Pulse 72  Temp(Src) 97.7 F (36.5 C)  Resp 16  Ht 5\' 7"  (1.702 m)  Wt 103.42 kg  BMI 35.70 kg/m2  SpO2 100%  LMP 03/22/2015 (Exact Date)   Physical Exam  Constitutional: She is oriented to person, place, and time. She appears well-developed and well-nourished.  HENT:  Head: Normocephalic and atraumatic.  Right Ear: Tympanic membrane, external ear and ear canal normal.  Left Ear: Tympanic membrane, external ear and ear canal normal.  Nose: Nose normal.  Mouth/Throat: Uvula is midline, oropharynx is clear and moist and mucous membranes are normal.  Eyes: Conjunctivae, EOM and lids are normal. Pupils are equal, round, and reactive to light. Right eye exhibits no discharge. Left eye exhibits no discharge. Right eye exhibits no nystagmus. Left eye exhibits no nystagmus.  Neck: Normal range of motion. Neck supple.  Cardiovascular: Normal rate, regular rhythm and normal heart sounds.   No murmur heard. Pulmonary/Chest: Effort normal and breath sounds normal. No respiratory distress. She has no wheezes. She has no rales.  Abdominal: Soft. There is no tenderness.  Musculoskeletal: She exhibits no edema or tenderness.       Cervical back: She exhibits normal range of motion, no tenderness and no bony tenderness.  Neurological: She is alert and oriented to person, place, and time. She has normal strength and  normal reflexes. No cranial nerve deficit or sensory deficit. She displays a negative Romberg sign. Coordination and gait normal. GCS eye subscore is 4. GCS verbal subscore is 5. GCS motor subscore is 6.  Normal finger-to-nose. 5/5 strength all extremities.   Skin: Skin is warm and dry.  Psychiatric: She has a normal mood and affect.  Nursing note and vitals reviewed.   ED Course  Procedures (including critical care time) Labs Review Labs Reviewed  URINALYSIS, ROUTINE W REFLEX MICROSCOPIC (NOT AT [facility]) - Abnormal; Notable for the following:    Hgb urine dipstick TRACE (*)    All other components within normal limits  CBC WITH DIFFERENTIAL/PLATELET - Abnormal; Notable for the following:    Hemoglobin 11.7 (*)    HCT 35.0 (*)    All other components within normal limits  BASIC METABOLIC PANEL - Abnormal; Notable for the following:  Glucose, Bld 113 (*)    All other components within normal limits  URINE MICROSCOPIC-ADD ON - Abnormal; Notable for the following:    Squamous Epithelial / LPF 0-5 (*)    Bacteria, UA RARE (*)    All other components within normal limits    Imaging Review No results found. I have personally reviewed and evaluated these images and lab results as part of my medical decision-making.   EKG Interpretation   Date/Time:  Tuesday March 27 2015 21:18:41 EDT Ventricular Rate:  84 PR Interval:  182 QRS Duration: 82 QT Interval:  390 QTC Calculation: 461 R Axis:   53 Text Interpretation:  Sinus rhythm Nonspecific T wave abnormality No  significant change since last tracing Confirmed by Ashok Cordia  MD, Lennette Bihari  ([id number]) on 03/27/2015 10:57:02 PM      9:22 PM Patient seen and examined. Work-up initiated. Medications ordered. Patient c/o spinning sensation with movement of her head that is also positional. She has had recent viral illness. Symptoms suggest BPPV. No other neuro deficits on exam to suggest stroke. No code stroke called.   Vital signs reviewed and  are as follows: BP 118/80 mmHg  Pulse 72  Temp(Src) 97.7 F (36.5 C)  Resp 16  Ht 5\' 7"  (1.702 m)  Wt 103.42 kg  BMI 35.70 kg/m2  SpO2 100%  LMP 03/22/2015 (Exact Date)  11:45 PM patient treated with meclizine with improvement and spinning sensation. However patient ambulates she is very unsteady on her feet and can only make several feet the door before she has to stop. She describes more dysequilibrium at this point vs vertigo. Discussed with Dr. Ashok Cordia. Will need to evaluate for central vertigo as patient cannot walk. Plan is for patient to hold in the ED until MRI is available in the AM. Pt updated.   1:04 AM Handoff to Carlota Raspberry PA-C at shift change. Plan as above.   MDM   Final diagnoses:  Vertigo  Ataxia   Pending MRI to eval for posterior stroke.    Carlisle Cater, PA-C 03/30/15 [place], MD 04/02/15 [phone]

## 2015-03-27 NOTE — ED Notes (Signed)
Pt needs MRI but will need to wait to AM to have scan. Josh G. PA is going to inform patient. Josh G. PA reports it is not urgent enough for her to be transported to [facility].

## 2015-03-28 ENCOUNTER — Emergency Department (HOSPITAL_COMMUNITY): Payer: BC Managed Care – PPO

## 2015-03-28 MED ORDER — ONDANSETRON HCL 4 MG PO TABS: 4.0000 mg | ORAL_TABLET | Freq: Four times a day (QID) | ORAL | Status: DC

## 2015-03-28 MED ORDER — MECLIZINE HCL 25 MG PO TABS: 25.0000 mg | ORAL_TABLET | Freq: Two times a day (BID) | ORAL | Status: DC | PRN

## 2015-03-28 NOTE — ED Notes (Signed)
Patient transported to MRI 

## 2015-03-28 NOTE — Discharge Instructions (Signed)
Please read and follow all provided instructions.  Your diagnoses today include:  1. Vertigo   2. Ataxia    Tests performed today include:  Vital signs. See below for your results today.   Medications prescribed:   Take and medication as prescribed   Home care instructions:  Follow any educational materials contained in this packet.  Follow-up instructions: Please follow-up with your primary care provider in the next 48 hours for further evaluation of symptoms and treatment   Return instructions:   Please return to the Emergency Department if you do not get better, if you get worse, or new symptoms OR  - Fever (temperature greater than 101.22F)  - Bleeding that does not stop with holding pressure to the area    -Severe pain (please note that you may be more sore the day after your accident)  - Chest Pain  - Difficulty breathing  - Severe nausea or vomiting  - Inability to tolerate food and liquids  - Passing out  - Skin becoming red around your wounds  - Change in mental status (confusion or lethargy)  - New numbness or weakness     Please return if you have any other emergent concerns.  Additional Information:  Your vital signs today were: BP 101/62 mmHg   Pulse 75   Temp(Src) 98.3 F (36.8 C) (Oral)   Resp 14   Ht 5\' 7"  (1.702 m)   Wt 103.42 kg   BMI 35.70 kg/m2   SpO2 97%   LMP 03/22/2015 (Exact Date) If your blood pressure (BP) was elevated above 135/85 this visit, please have this repeated by your doctor within one month. ---------------

## 2015-03-28 NOTE — ED Provider Notes (Signed)
Patient sign out from [place], [place]  The patient states that she cannot walk. The patient needs an MRI for dizziness but it will not be STAT and can be done when the team comes in the morning. She will hold in the ER overnight.  At 6am, patient sign out to Shary Decamp, PA-C. Pt to obtain brain MRI around 7-8 am  Delos Haring, PA-C 03/28/15 [place], MD 03/29/15 [phone]

## 2015-03-28 NOTE — ED Notes (Signed)
Pt able to ambulate without any issues.  Pt reports nausea.  Pt states her dizziness and headache improved.

## 2015-03-28 NOTE — ED Provider Notes (Signed)
  Physical Exam  BP 101/62 mmHg  Pulse 75  Temp(Src) 98.3 F (36.8 C) (Oral)  Resp 14  Ht 5\' 7"  (1.702 m)  Wt 103.42 kg  BMI 35.70 kg/m2  SpO2 97%  LMP 03/22/2015 (Exact Date)  Physical Exam  ED Course  Procedures  MDM Sign out from Delos Haring, PA-C Initial presentation of unable to walk MRI to be done in AM Dispo pending results  7:44 AM- MRI unremarkable. Will DC patient home with follow up to PCP. Given Meclizine and Zofran RX. At time of discharge, Patient is in no acute distress. Vital Signs are stable. Patient is able to ambulate. Patient able to tolerate PO.          Shary Decamp, PA-C 03/28/15 [place], MD 03/29/15 [phone]

## 2015-05-17 ENCOUNTER — Other Ambulatory Visit: Payer: Self-pay | Admitting: Orthopedic Surgery

## 2015-06-07 ENCOUNTER — Encounter (HOSPITAL_COMMUNITY)
Admission: RE | Admit: 2015-06-07 | Discharge: 2015-06-07 | Disposition: A | Discharge status: Home / Self Care | Payer: BC Managed Care – PPO | Source: Ambulatory Visit | Attending: Orthopedic Surgery | Admitting: Orthopedic Surgery

## 2015-06-07 ENCOUNTER — Other Ambulatory Visit (HOSPITAL_COMMUNITY): Payer: Self-pay | Admitting: *Deleted

## 2015-06-07 ENCOUNTER — Encounter (HOSPITAL_COMMUNITY): Payer: Self-pay

## 2015-06-07 DIAGNOSIS — Z01818 Encounter for other preprocedural examination: Secondary | ICD-10-CM | POA: Diagnosis present

## 2015-06-07 DIAGNOSIS — M1711 Unilateral primary osteoarthritis, right knee: Secondary | ICD-10-CM | POA: Diagnosis not present

## 2015-06-07 DIAGNOSIS — Z0183 Encounter for blood typing: Secondary | ICD-10-CM | POA: Insufficient documentation

## 2015-06-07 DIAGNOSIS — Z01812 Encounter for preprocedural laboratory examination: Secondary | ICD-10-CM | POA: Diagnosis not present

## 2015-06-07 HISTORY — DX: Reserved for inherently not codable concepts without codable children: IMO0001

## 2015-06-07 HISTORY — DX: Other seasonal allergic rhinitis: J30.2

## 2015-06-07 LAB — TYPE AND SCREEN
ABO/RH(D): O POS
ANTIBODY SCREEN: NEGATIVE

## 2015-06-07 LAB — CBC WITH DIFFERENTIAL/PLATELET
Basophils Absolute: 0 10*3/uL (ref 0.0–0.1)
Basophils Relative: 0 %
EOS ABS: 0.1 10*3/uL (ref 0.0–0.7)
EOS PCT: 2 %
HCT: 38.2 % (ref 36.0–46.0)
Hemoglobin: 12.3 g/dL (ref 12.0–15.0)
LYMPHS ABS: 1.5 10*3/uL (ref 0.7–4.0)
Lymphocytes Relative: 27 %
MCH: 27.8 pg (ref 26.0–34.0)
MCHC: 32.2 g/dL (ref 30.0–36.0)
MCV: 86.2 fL (ref 78.0–100.0)
MONO ABS: 0.4 10*3/uL (ref 0.1–1.0)
Monocytes Relative: 7 %
Neutro Abs: 3.4 10*3/uL (ref 1.7–7.7)
Neutrophils Relative %: 64 %
Platelets: 323 10*3/uL (ref 150–400)
RBC: 4.43 MIL/uL (ref 3.87–5.11)
RDW: 13.6 % (ref 11.5–15.5)
WBC: 5.5 10*3/uL (ref 4.0–10.5)

## 2015-06-07 LAB — BASIC METABOLIC PANEL
Anion gap: 9 (ref 5–15)
BUN: 7 mg/dL (ref 6–20)
CHLORIDE: 100 mmol/L — AB (ref 101–111)
CO2: 28 mmol/L (ref 22–32)
CREATININE: 0.8 mg/dL (ref 0.44–1.00)
Calcium: 9.3 mg/dL (ref 8.9–10.3)
GFR calc Af Amer: >60 mL/min (ref >60)
GFR calc non Af Amer: >60 mL/min (ref >60)
Glucose, Bld: 75 mg/dL (ref 65–99)
Potassium: 3.6 mmol/L (ref 3.5–5.1)
SODIUM: 137 mmol/L (ref 135–145)

## 2015-06-07 LAB — SURGICAL PCR SCREEN
MRSA, PCR: NEGATIVE
STAPHYLOCOCCUS AUREUS: NEGATIVE

## 2015-06-07 LAB — ABO/RH: ABO/RH(D): O POS

## 2015-06-07 LAB — HCG, SERUM, QUALITATIVE: Preg, Serum: NEGATIVE

## 2015-06-07 LAB — PROTIME-INR
INR: 1.02 (ref 0.00–1.49)
PROTHROMBIN TIME: 13.6 s (ref 11.6–15.2)

## 2015-06-07 LAB — APTT: APTT: 30 s (ref 24–37)

## 2015-06-07 NOTE — Progress Notes (Signed)
Pt unable to obtain a urine sample today. Note placed on chart for DOS.

## 2015-06-07 NOTE — Progress Notes (Signed)
Pt denies having a cardiologist or any cardiac studies. PCP is Alma Friendly NP with [facility] in [place]  EKG 03/27/15 in EPIC CXR today

## 2015-06-07 NOTE — Pre-Procedure Instructions (Addendum)
    Krystal Mann  06/07/2015      [facility] [place], [place] - [address] [place] [address] Phone: [phone] Fax: [phone]    Your procedure is scheduled on Monday June 5th  Report to [facility] Admitting at 9:00 am  Call this number if you have problems the morning of surgery: [phone]  If questions prior to surgery date you may call [phone] M-F between 8 and 4   Remember:  Do not eat food or drink liquids after midnight.  Take these medicines the morning of surgery with A SIP OF WATER: Tylenol if needed, Flonase nasal spray, Tramadol if needed Stop taking aspirin or aspirin containing products, Nsaids (including your ibuprofen), vitamins and herbal supplements 7 days prior to surgery.   Do not wear jewelry, make-up or nail polish.  Do not wear lotions, powders, or perfumes.  You may wear deodorant.  Do not shave 48 hours prior to surgery.  Men may shave face and neck.  Do not bring valuables to the hospital.  [facility] is not responsible for any belongings or valuables.  Contacts, dentures or bridgework may not be worn into surgery.  Leave your suitcase in the car.  After surgery it may be brought to your room.  For patients admitted to the hospital, discharge time will be determined by your treatment team.  Special instructions: Shower with CHG the night before and morning of surgery with CHG as instructed Please read over the following fact sheets that you were given. Blood Transfusion Information and MRSA Information, Incentive spirometry, EMMI video

## 2015-06-15 DIAGNOSIS — M1711 Unilateral primary osteoarthritis, right knee: Secondary | ICD-10-CM | POA: Diagnosis present

## 2015-06-16 NOTE — H&P (Signed)
TOTAL KNEE ADMISSION H&P  Patient is being admitted for right total knee arthroplasty.  Subjective:  Chief Complaint:right knee pain.  HPI: Krystal Mann, 45 y.o. female, has a history of pain and functional disability in the right knee due to arthritis and has failed non-surgical conservative treatments for greater than 12 weeks to includeNSAID's and/or analgesics, corticosteriod injections, viscosupplementation injections, flexibility and strengthening excercises, use of assistive devices, weight reduction as appropriate and activity modification.  Onset of symptoms was gradual, starting 2 years ago with gradually worsening course since that time. The patient noted no past surgery on the right knee(s).  Patient currently rates pain in the right knee(s) at 10 out of 10 with activity. Patient has night pain, worsening of pain with activity and weight bearing, pain that interferes with activities of daily living, pain with passive range of motion, crepitus and joint swelling.  Patient has evidence of periarticular osteophytes and joint space narrowing by imaging studies. There is no active infection.  Patient Active Problem List   Diagnosis Date Noted  . Primary osteoarthritis of right knee 06/15/2015  . Preventative health care 01/01/2015  . Family history of familial polyposis - attenuated - brother 08/31/2014  . Family history of colon cancer 08/30/2014  . Axillary abscess 06/13/2014  . Knee pain, chronic 06/13/2014  . Obesity (BMI 35.0-39.9 without comorbidity) ([place]) 06/13/2014  . Eczema 06/13/2014  . Breast lump on right side at 9 o'clock position 07/27/2012  . Bilateral lower extremity edema 10/10/2011  . Encounter to establish care with new doctor 10/10/2011   Past Medical History  Diagnosis Date  . Anemia   . Depression 2004  . Arthritis     knee  . Back pain   . Rotator cuff (capsule) sprain     right  . Chicken pox   . Diverticulitis   . Migraines     no longer has  them. Was medication related  . Shortness of breath dyspnea     occasionally  . Seasonal allergies     Past Surgical History  Procedure Laterality Date  . Cervical discectomy  2012  . Tubal ligation      No prescriptions prior to admission   Allergies  Allergen Reactions  . Hydroxyprogesterone Caproate Other (See Comments)    Severe headache  . Morphine And Related Itching and Nausea And Vomiting  . Hydrocodone Itching    Social History  Substance Use Topics  . Smoking status: Never Smoker   . Smokeless tobacco: Never Used  . Alcohol Use: No    Family History  Problem Relation Age of Onset  . Kidney cancer Mother     malaginant cancer (kidney), skin cancer  . Diabetes Mother   . Hyperlipidemia Mother   . Lung cancer Father   . Diabetes Sister   . Asthma Sister   . Skin cancer Mother   . Colon polyps Brother 41    x 21 removed  . Liver cancer Brother 64  . Colon polyps Brother      Review of Systems  Constitutional: Positive for malaise/fatigue.  HENT:       Sinus problems  Eyes: Negative.   Respiratory: Negative.   Cardiovascular: Positive for leg swelling.  Gastrointestinal: Positive for nausea, diarrhea and constipation.  Genitourinary: Negative.   Musculoskeletal: Positive for myalgias and joint pain.  Skin: Negative.   Neurological: Positive for dizziness and headaches.       Poor balance  Endo/Heme/Allergies: Bruises/bleeds easily.  Psychiatric/Behavioral: Negative.  Objective:  Physical Exam  Constitutional: She is oriented to person, place, and time. She appears well-developed and well-nourished.  HENT:  Head: Normocephalic and atraumatic.  Eyes: Pupils are equal, round, and reactive to light.  Neck: Normal range of motion. Neck supple.  Cardiovascular: Intact distal pulses.   Respiratory: Effort normal.  Musculoskeletal: She exhibits tenderness.  she has a range from approximately 10 to 120.  Tenderness over the medial lateral joint  lines.  Obvious patellofemoral crepitus.  No instability.  She does have a mild effusion.  Her calves are soft and nontender.  She is neurovascularly intact distally.  Neurological: She is alert and oriented to person, place, and time.  Skin: Skin is warm and dry.  Psychiatric: She has a normal mood and affect. Her behavior is normal. Judgment and thought content normal.    Vital signs in last 24 hours:    Labs:   Estimated body mass index is 35.70 kg/(m^2) as calculated from the following:   Height as of 03/27/15: 5\' 7"  (1.702 m).   Weight as of 03/27/15: 103.42 kg (228 lb).   Imaging Review Plain radiographs demonstrate bilateral AP weightbearing, bilateral Rosenberg, lateral sunrise views of the right knee are taken and reviewed in office today.  Patient does have moderate to severe osteoarthritis of the lateral compartment of the right knee.  She also has severe arthritis of the patellofemoral compartment.   Assessment/Plan:  End stage arthritis, right knee   The patient history, physical examination, clinical judgment of the provider and imaging studies are consistent with end stage degenerative joint disease of the right knee(s) and total knee arthroplasty is deemed medically necessary. The treatment options including medical management, injection therapy arthroscopy and arthroplasty were discussed at length. The risks and benefits of total knee arthroplasty were presented and reviewed. The risks due to aseptic loosening, infection, stiffness, patella tracking problems, thromboembolic complications and other imponderables were discussed. The patient acknowledged the explanation, agreed to proceed with the plan and consent was signed. Patient is being admitted for inpatient treatment for surgery, pain control, PT, OT, prophylactic antibiotics, VTE prophylaxis, progressive ambulation and ADL's and discharge planning. The patient is planning to be discharged home with home health  services

## 2015-06-18 ENCOUNTER — Inpatient Hospital Stay (HOSPITAL_COMMUNITY): Payer: BC Managed Care – PPO | Admitting: Certified Registered"

## 2015-06-18 ENCOUNTER — Encounter (HOSPITAL_COMMUNITY)
Admission: RE | Disposition: A | Discharge status: Skilled Nursing Facility | Payer: Self-pay | Source: Ambulatory Visit | Attending: Orthopedic Surgery

## 2015-06-18 ENCOUNTER — Inpatient Hospital Stay (HOSPITAL_COMMUNITY)
Admission: RE | Admit: 2015-06-18 | Discharge: 2015-06-21 | DRG: 470 | Disposition: A | Discharge status: Skilled Nursing Facility | Payer: BC Managed Care – PPO | Source: Ambulatory Visit | Attending: Orthopedic Surgery | Admitting: Orthopedic Surgery

## 2015-06-18 ENCOUNTER — Encounter (HOSPITAL_COMMUNITY): Payer: Self-pay

## 2015-06-18 DIAGNOSIS — Z808 Family history of malignant neoplasm of other organs or systems: Secondary | ICD-10-CM | POA: Diagnosis not present

## 2015-06-18 DIAGNOSIS — F329 Major depressive disorder, single episode, unspecified: Secondary | ICD-10-CM | POA: Diagnosis present

## 2015-06-18 DIAGNOSIS — Z6835 Body mass index (BMI) 35.0-35.9, adult: Secondary | ICD-10-CM | POA: Diagnosis not present

## 2015-06-18 DIAGNOSIS — D62 Acute posthemorrhagic anemia: Secondary | ICD-10-CM | POA: Diagnosis not present

## 2015-06-18 DIAGNOSIS — M1711 Unilateral primary osteoarthritis, right knee: Secondary | ICD-10-CM | POA: Diagnosis present

## 2015-06-18 DIAGNOSIS — G8929 Other chronic pain: Secondary | ICD-10-CM | POA: Diagnosis present

## 2015-06-18 DIAGNOSIS — Z7982 Long term (current) use of aspirin: Secondary | ICD-10-CM | POA: Diagnosis not present

## 2015-06-18 DIAGNOSIS — Z888 Allergy status to other drugs, medicaments and biological substances status: Secondary | ICD-10-CM

## 2015-06-18 DIAGNOSIS — M549 Dorsalgia, unspecified: Secondary | ICD-10-CM | POA: Diagnosis present

## 2015-06-18 DIAGNOSIS — Z886 Allergy status to analgesic agent status: Secondary | ICD-10-CM | POA: Diagnosis not present

## 2015-06-18 DIAGNOSIS — Z885 Allergy status to narcotic agent status: Secondary | ICD-10-CM | POA: Diagnosis not present

## 2015-06-18 DIAGNOSIS — Z800 Family history of malignant neoplasm of digestive organs: Secondary | ICD-10-CM

## 2015-06-18 DIAGNOSIS — Z8051 Family history of malignant neoplasm of kidney: Secondary | ICD-10-CM

## 2015-06-18 DIAGNOSIS — Z801 Family history of malignant neoplasm of trachea, bronchus and lung: Secondary | ICD-10-CM | POA: Diagnosis not present

## 2015-06-18 HISTORY — PX: TOTAL KNEE ARTHROPLASTY: SHX125

## 2015-06-18 LAB — GLUCOSE, CAPILLARY: GLUCOSE-CAPILLARY: 162 mg/dL — AB (ref 65–99)

## 2015-06-18 SURGERY — ARTHROPLASTY, KNEE, TOTAL
Anesthesia: Spinal | Site: Knee | Laterality: Right

## 2015-06-18 MED ORDER — METHOCARBAMOL 500 MG PO TABS
500.0000 mg | ORAL_TABLET | Freq: Four times a day (QID) | ORAL | Status: DC | PRN
  Administered 2015-06-18 – 2015-06-21 (×9): 500 mg via ORAL
  Filled 2015-06-18 (×8): qty 1

## 2015-06-18 MED ORDER — OXYCODONE HCL 5 MG PO TABS
5.0000 mg | ORAL_TABLET | ORAL | Status: DC | PRN
  Administered 2015-06-18 – 2015-06-21 (×16): 10 mg via ORAL
  Filled 2015-06-18 (×16): qty 2

## 2015-06-18 MED ORDER — 0.9 % SODIUM CHLORIDE (POUR BTL) OPTIME
TOPICAL | Status: DC | PRN
  Administered 2015-06-18: 1000 mL

## 2015-06-18 MED ORDER — OXYCODONE-ACETAMINOPHEN 5-325 MG PO TABS: 1.0000 | ORAL_TABLET | ORAL | Status: DC | PRN

## 2015-06-18 MED ORDER — METHOCARBAMOL 1000 MG/10ML IJ SOLN
500.0000 mg | Freq: Four times a day (QID) | INTRAVENOUS | Status: DC | PRN
  Filled 2015-06-18: qty 5

## 2015-06-18 MED ORDER — TRANEXAMIC ACID 1000 MG/10ML IV SOLN
1000.0000 mg | Freq: Once | INTRAVENOUS | Status: AC
  Administered 2015-06-18: 1000 mg via INTRAVENOUS
  Filled 2015-06-18: qty 10

## 2015-06-18 MED ORDER — ASPIRIN EC 325 MG PO TBEC: 325.0000 mg | DELAYED_RELEASE_TABLET | Freq: Two times a day (BID) | ORAL | Status: DC

## 2015-06-18 MED ORDER — SENNOSIDES-DOCUSATE SODIUM 8.6-50 MG PO TABS: 1.0000 | ORAL_TABLET | Freq: Every evening | ORAL | Status: DC | PRN

## 2015-06-18 MED ORDER — MIDAZOLAM HCL 2 MG/2ML IJ SOLN
INTRAMUSCULAR | Status: AC
  Filled 2015-06-18: qty 2

## 2015-06-18 MED ORDER — DEXAMETHASONE SODIUM PHOSPHATE 10 MG/ML IJ SOLN
INTRAMUSCULAR | Status: AC
  Filled 2015-06-18: qty 1

## 2015-06-18 MED ORDER — ASPIRIN EC 325 MG PO TBEC
325.0000 mg | DELAYED_RELEASE_TABLET | Freq: Every day | ORAL | Status: DC
  Administered 2015-06-19 – 2015-06-21 (×3): 325 mg via ORAL
  Filled 2015-06-18 (×3): qty 1

## 2015-06-18 MED ORDER — ALUM & MAG HYDROXIDE-SIMETH 200-200-20 MG/5ML PO SUSP: 30.0000 mL | ORAL | Status: DC | PRN

## 2015-06-18 MED ORDER — MENTHOL 3 MG MT LOZG: 1.0000 | LOZENGE | OROMUCOSAL | Status: DC | PRN

## 2015-06-18 MED ORDER — DEXAMETHASONE SODIUM PHOSPHATE 10 MG/ML IJ SOLN
INTRAMUSCULAR | Status: DC | PRN
  Administered 2015-06-18: 4 mg via INTRAVENOUS

## 2015-06-18 MED ORDER — HYDROMORPHONE HCL 1 MG/ML IJ SOLN
INTRAMUSCULAR | Status: AC
  Filled 2015-06-18: qty 1

## 2015-06-18 MED ORDER — TRANEXAMIC ACID 1000 MG/10ML IV SOLN
1000.0000 mg | INTRAVENOUS | Status: AC
  Administered 2015-06-18: 1000 mg via INTRAVENOUS
  Filled 2015-06-18: qty 10

## 2015-06-18 MED ORDER — BUPIVACAINE IN DEXTROSE 0.75-8.25 % IT SOLN
INTRATHECAL | Status: DC | PRN
  Administered 2015-06-18: 2 mL via INTRATHECAL

## 2015-06-18 MED ORDER — ONDANSETRON HCL 4 MG/2ML IJ SOLN
INTRAMUSCULAR | Status: DC | PRN
  Administered 2015-06-18: 4 mg via INTRAVENOUS

## 2015-06-18 MED ORDER — ONDANSETRON HCL 4 MG/2ML IJ SOLN
4.0000 mg | Freq: Four times a day (QID) | INTRAMUSCULAR | Status: DC | PRN
  Administered 2015-06-18 – 2015-06-19 (×2): 4 mg via INTRAVENOUS
  Filled 2015-06-18 (×2): qty 2

## 2015-06-18 MED ORDER — CLINDAMYCIN PHOSPHATE 1 % EX LOTN: 1.0000 "application " | TOPICAL_LOTION | CUTANEOUS | Status: DC

## 2015-06-18 MED ORDER — BISACODYL 5 MG PO TBEC
5.0000 mg | DELAYED_RELEASE_TABLET | Freq: Every day | ORAL | Status: DC | PRN
  Administered 2015-06-21: 5 mg via ORAL
  Filled 2015-06-18: qty 1

## 2015-06-18 MED ORDER — KCL IN DEXTROSE-NACL 20-5-0.45 MEQ/L-%-% IV SOLN
INTRAVENOUS | Status: DC
  Administered 2015-06-18 – 2015-06-19 (×2): via INTRAVENOUS
  Filled 2015-06-18 (×2): qty 1000

## 2015-06-18 MED ORDER — FENTANYL CITRATE (PF) 250 MCG/5ML IJ SOLN
INTRAMUSCULAR | Status: AC
  Filled 2015-06-18: qty 5

## 2015-06-18 MED ORDER — CHLORHEXIDINE GLUCONATE 4 % EX LIQD: 60.0000 mL | Freq: Once | CUTANEOUS | Status: DC

## 2015-06-18 MED ORDER — NAPHAZOLINE-GLYCERIN 0.012-0.2 % OP SOLN: 1.0000 [drp] | Freq: Every day | OPHTHALMIC | Status: DC | PRN

## 2015-06-18 MED ORDER — TRANEXAMIC ACID 1000 MG/10ML IV SOLN
2000.0000 mg | Freq: Once | INTRAVENOUS | Status: AC
  Administered 2015-06-18: 2000 mg via TOPICAL
  Filled 2015-06-18: qty 20

## 2015-06-18 MED ORDER — HYDROMORPHONE HCL 1 MG/ML IJ SOLN
0.5000 mg | INTRAMUSCULAR | Status: DC | PRN
  Administered 2015-06-18 – 2015-06-21 (×24): 1 mg via INTRAVENOUS
  Filled 2015-06-18 (×25): qty 1

## 2015-06-18 MED ORDER — ACETAMINOPHEN 325 MG PO TABS
650.0000 mg | ORAL_TABLET | Freq: Four times a day (QID) | ORAL | Status: DC | PRN
  Administered 2015-06-18 – 2015-06-21 (×2): 650 mg via ORAL
  Filled 2015-06-18: qty 2

## 2015-06-18 MED ORDER — NAPHAZOLINE-GLYCERIN-ZINC SULF 0.012-0.25-0.25 % OP SOLN: 1.0000 [drp] | Freq: Every day | OPHTHALMIC | Status: DC | PRN

## 2015-06-18 MED ORDER — DIPHENHYDRAMINE HCL 12.5 MG/5ML PO ELIX
12.5000 mg | ORAL_SOLUTION | ORAL | Status: DC | PRN
  Administered 2015-06-19 (×2): 25 mg via ORAL
  Administered 2015-06-19: 12.5 mg via ORAL
  Administered 2015-06-19 – 2015-06-21 (×5): 25 mg via ORAL
  Filled 2015-06-18 (×10): qty 10

## 2015-06-18 MED ORDER — FLUTICASONE PROPIONATE 50 MCG/ACT NA SUSP
2.0000 | Freq: Every day | NASAL | Status: DC
Start: 2015-06-19 — End: 2015-06-21
  Administered 2015-06-19 – 2015-06-21 (×3): 2 via NASAL
  Filled 2015-06-18: qty 16

## 2015-06-18 MED ORDER — SODIUM CHLORIDE 0.9 % IJ SOLN
INTRAMUSCULAR | Status: DC | PRN
Start: 2015-06-18 — End: 2015-06-18
  Administered 2015-06-18: 50 mL via INTRAVENOUS

## 2015-06-18 MED ORDER — ONDANSETRON HCL 4 MG PO TABS
4.0000 mg | ORAL_TABLET | Freq: Four times a day (QID) | ORAL | Status: DC | PRN
  Administered 2015-06-19 – 2015-06-20 (×2): 4 mg via ORAL
  Filled 2015-06-18 (×2): qty 1

## 2015-06-18 MED ORDER — METHOCARBAMOL 500 MG PO TABS
ORAL_TABLET | ORAL | Status: AC
  Administered 2015-06-18: 500 mg via ORAL
  Filled 2015-06-18: qty 1

## 2015-06-18 MED ORDER — METHOCARBAMOL 500 MG PO TABS: 500.0000 mg | ORAL_TABLET | Freq: Two times a day (BID) | ORAL | Status: DC

## 2015-06-18 MED ORDER — CEFUROXIME SODIUM 1.5 G IJ SOLR
INTRAMUSCULAR | Status: DC | PRN
  Administered 2015-06-18: 1.5 g

## 2015-06-18 MED ORDER — ACETAMINOPHEN 325 MG PO TABS
ORAL_TABLET | ORAL | Status: AC
  Administered 2015-06-18: 650 mg via ORAL
  Filled 2015-06-18: qty 2

## 2015-06-18 MED ORDER — ONDANSETRON HCL 4 MG/2ML IJ SOLN
INTRAMUSCULAR | Status: AC
  Filled 2015-06-18: qty 2

## 2015-06-18 MED ORDER — PHENOL 1.4 % MT LIQD: 1.0000 | OROMUCOSAL | Status: DC | PRN

## 2015-06-18 MED ORDER — METOCLOPRAMIDE HCL 5 MG/ML IJ SOLN: 5.0000 mg | Freq: Three times a day (TID) | INTRAMUSCULAR | Status: DC | PRN

## 2015-06-18 MED ORDER — SODIUM CHLORIDE 0.9 % IR SOLN
Status: DC | PRN
  Administered 2015-06-18: 1000 mL

## 2015-06-18 MED ORDER — PROPOFOL 10 MG/ML IV BOLUS
INTRAVENOUS | Status: AC
  Filled 2015-06-18: qty 20

## 2015-06-18 MED ORDER — BUPIVACAINE LIPOSOME 1.3 % IJ SUSP
20.0000 mL | Freq: Once | INTRAMUSCULAR | Status: AC
  Administered 2015-06-18: 20 mL
  Filled 2015-06-18: qty 20

## 2015-06-18 MED ORDER — PROPOFOL 10 MG/ML IV BOLUS
INTRAVENOUS | Status: DC | PRN
  Administered 2015-06-18: 20 mg via INTRAVENOUS

## 2015-06-18 MED ORDER — BUPIVACAINE-EPINEPHRINE (PF) 0.25% -1:200000 IJ SOLN
INTRAMUSCULAR | Status: DC | PRN
  Administered 2015-06-18: 50 mL

## 2015-06-18 MED ORDER — MIDAZOLAM HCL 5 MG/5ML IJ SOLN
INTRAMUSCULAR | Status: DC | PRN
  Administered 2015-06-18: 2 mg via INTRAVENOUS

## 2015-06-18 MED ORDER — CEFAZOLIN SODIUM-DEXTROSE 2-4 GM/100ML-% IV SOLN
2.0000 g | INTRAVENOUS | Status: AC
  Administered 2015-06-18: 2 g via INTRAVENOUS
  Filled 2015-06-18: qty 100

## 2015-06-18 MED ORDER — DOCUSATE SODIUM 100 MG PO CAPS
100.0000 mg | ORAL_CAPSULE | Freq: Two times a day (BID) | ORAL | Status: DC
  Administered 2015-06-18 – 2015-06-21 (×6): 100 mg via ORAL
  Filled 2015-06-18 (×6): qty 1

## 2015-06-18 MED ORDER — MIDAZOLAM HCL 2 MG/2ML IJ SOLN
0.5000 mg | Freq: Once | INTRAMUSCULAR | Status: AC | PRN
  Administered 2015-06-18: 2 mg via INTRAVENOUS

## 2015-06-18 MED ORDER — DEXTROSE-NACL 5-0.45 % IV SOLN: INTRAVENOUS | Status: DC

## 2015-06-18 MED ORDER — MEPERIDINE HCL 25 MG/ML IJ SOLN: 6.2500 mg | INTRAMUSCULAR | Status: DC | PRN

## 2015-06-18 MED ORDER — HYDROMORPHONE HCL 1 MG/ML IJ SOLN
0.2500 mg | INTRAMUSCULAR | Status: DC | PRN
  Administered 2015-06-18 (×2): 0.5 mg via INTRAVENOUS

## 2015-06-18 MED ORDER — METOCLOPRAMIDE HCL 5 MG PO TABS: 5.0000 mg | ORAL_TABLET | Freq: Three times a day (TID) | ORAL | Status: DC | PRN

## 2015-06-18 MED ORDER — PROPOFOL 500 MG/50ML IV EMUL
INTRAVENOUS | Status: DC | PRN
  Administered 2015-06-18: 50 ug/kg/min via INTRAVENOUS

## 2015-06-18 MED ORDER — PHENYLEPHRINE HCL 10 MG/ML IJ SOLN
INTRAMUSCULAR | Status: DC | PRN
  Administered 2015-06-18 (×2): 40 ug via INTRAVENOUS
  Administered 2015-06-18: 80 ug via INTRAVENOUS
  Administered 2015-06-18: 40 ug via INTRAVENOUS

## 2015-06-18 MED ORDER — ACETAMINOPHEN 650 MG RE SUPP: 650.0000 mg | Freq: Four times a day (QID) | RECTAL | Status: DC | PRN

## 2015-06-18 MED ORDER — FENTANYL CITRATE (PF) 100 MCG/2ML IJ SOLN
INTRAMUSCULAR | Status: DC | PRN
  Administered 2015-06-18 (×2): 50 ug via INTRAVENOUS

## 2015-06-18 MED ORDER — PROMETHAZINE HCL 25 MG/ML IJ SOLN: 6.2500 mg | INTRAMUSCULAR | Status: DC | PRN

## 2015-06-18 MED ORDER — FLEET ENEMA 7-19 GM/118ML RE ENEM: 1.0000 | ENEMA | Freq: Once | RECTAL | Status: DC | PRN

## 2015-06-18 MED ORDER — LACTATED RINGERS IV SOLN
INTRAVENOUS | Status: DC
  Administered 2015-06-18 (×3): via INTRAVENOUS

## 2015-06-18 MED ORDER — OXYCODONE HCL 5 MG PO TABS
ORAL_TABLET | ORAL | Status: AC
  Administered 2015-06-18: 10 mg via ORAL
  Filled 2015-06-18: qty 2

## 2015-06-18 SURGICAL SUPPLY — 56 items
BAG DECANTER FOR FLEXI CONT (MISCELLANEOUS) ×3 IMPLANT
BANDAGE ESMARK 6X9 LF (GAUZE/BANDAGES/DRESSINGS) ×1 IMPLANT
BLADE SAG 18X100X1.27 (BLADE) ×6 IMPLANT
BLADE SAW SGTL 13X75X1.27 (BLADE) ×3 IMPLANT
BLADE SURG ROTATE 9660 (MISCELLANEOUS) IMPLANT
BNDG ELASTIC 6X10 VLCR STRL LF (GAUZE/BANDAGES/DRESSINGS) ×3 IMPLANT
BNDG ELASTIC 6X15 VLCR STRL LF (GAUZE/BANDAGES/DRESSINGS) ×3 IMPLANT
BNDG ESMARK 6X9 LF (GAUZE/BANDAGES/DRESSINGS) ×3
BOWL SMART MIX CTS (DISPOSABLE) ×3 IMPLANT
CAPT KNEE TOTAL 3 ATTUNE ×3 IMPLANT
CEMENT HV SMART SET (Cement) ×6 IMPLANT
COVER SURGICAL LIGHT HANDLE (MISCELLANEOUS) ×3 IMPLANT
CUFF TOURNIQUET SINGLE 34IN LL (TOURNIQUET CUFF) IMPLANT
CUFF TOURNIQUET SINGLE 44IN (TOURNIQUET CUFF) ×3 IMPLANT
DECANTER SPIKE VIAL GLASS SM (MISCELLANEOUS) ×6 IMPLANT
DRAPE EXTREMITY T 121X128X90 (DRAPE) ×3 IMPLANT
DRAPE U-SHAPE 47X51 STRL (DRAPES) ×3 IMPLANT
DRSG AQUACEL AG ADV 3.5X10 (GAUZE/BANDAGES/DRESSINGS) IMPLANT
DRSG AQUACEL AG ADV 3.5X14 (GAUZE/BANDAGES/DRESSINGS) ×3 IMPLANT
DURAPREP 26ML APPLICATOR (WOUND CARE) ×6 IMPLANT
ELECT REM PT RETURN 9FT ADLT (ELECTROSURGICAL) ×3
ELECTRODE REM PT RTRN 9FT ADLT (ELECTROSURGICAL) ×1 IMPLANT
EVACUATOR 1/8 PVC DRAIN (DRAIN) IMPLANT
GLOVE BIO SURGEON STRL SZ7.5 (GLOVE) ×3 IMPLANT
GLOVE BIO SURGEON STRL SZ8.5 (GLOVE) ×3 IMPLANT
GLOVE BIOGEL PI IND STRL 8 (GLOVE) ×1 IMPLANT
GLOVE BIOGEL PI IND STRL 9 (GLOVE) ×1 IMPLANT
GLOVE BIOGEL PI INDICATOR 8 (GLOVE) ×2
GLOVE BIOGEL PI INDICATOR 9 (GLOVE) ×2
GOWN STRL REUS W/ TWL LRG LVL3 (GOWN DISPOSABLE) ×1 IMPLANT
GOWN STRL REUS W/ TWL XL LVL3 (GOWN DISPOSABLE) ×2 IMPLANT
GOWN STRL REUS W/TWL LRG LVL3 (GOWN DISPOSABLE) ×2
GOWN STRL REUS W/TWL XL LVL3 (GOWN DISPOSABLE) ×4
HANDPIECE INTERPULSE COAX TIP (DISPOSABLE) ×2
HOOD PEEL AWAY FACE SHEILD DIS (HOOD) ×6 IMPLANT
KIT BASIN OR (CUSTOM PROCEDURE TRAY) ×3 IMPLANT
KIT ROOM TURNOVER OR (KITS) ×3 IMPLANT
MANIFOLD NEPTUNE II (INSTRUMENTS) ×3 IMPLANT
NEEDLE SPNL 18GX3.5 QUINCKE PK (NEEDLE) IMPLANT
NS IRRIG 1000ML POUR BTL (IV SOLUTION) ×3 IMPLANT
PACK TOTAL JOINT (CUSTOM PROCEDURE TRAY) ×3 IMPLANT
PAD ARMBOARD 7.5X6 YLW CONV (MISCELLANEOUS) ×6 IMPLANT
SET HNDPC FAN SPRY TIP SCT (DISPOSABLE) ×1 IMPLANT
SUT VIC AB 0 CT1 27 (SUTURE) ×2
SUT VIC AB 0 CT1 27XBRD ANBCTR (SUTURE) ×1 IMPLANT
SUT VIC AB 1 CTX 36 (SUTURE) ×2
SUT VIC AB 1 CTX36XBRD ANBCTR (SUTURE) ×1 IMPLANT
SUT VIC AB 2-0 CT1 27 (SUTURE)
SUT VIC AB 2-0 CT1 TAPERPNT 27 (SUTURE) IMPLANT
SUT VIC AB 3-0 CT1 27 (SUTURE) ×2
SUT VIC AB 3-0 CT1 TAPERPNT 27 (SUTURE) ×1 IMPLANT
SYR 50ML LL SCALE MARK (SYRINGE) ×3 IMPLANT
TOWEL OR 17X24 6PK STRL BLUE (TOWEL DISPOSABLE) ×3 IMPLANT
TOWEL OR 17X26 10 PK STRL BLUE (TOWEL DISPOSABLE) ×3 IMPLANT
TRAY CATH 16FR W/PLASTIC CATH (SET/KITS/TRAYS/PACK) ×3 IMPLANT
WATER STERILE IRR 1000ML POUR (IV SOLUTION) ×9 IMPLANT

## 2015-06-18 NOTE — Anesthesia Preprocedure Evaluation (Addendum)
Anesthesia Evaluation  Patient identified by MRN, date of birth, ID band Patient awake    Reviewed: Allergy & Precautions, NPO status , Patient's Chart, lab work & pertinent test results  History of Anesthesia Complications Negative for: history of anesthetic complications  Airway Mallampati: II  TM Distance: >3 FB Neck ROM: Full    Dental  (+) Dental Advisory Given   Pulmonary neg pulmonary ROS,    breath sounds clear to auscultation       Cardiovascular (-) anginanegative cardio ROS   Rhythm:Regular Rate:Normal     Neuro/Psych  Headaches, Depression Chronic back pain: tramadol    GI/Hepatic negative GI ROS, Neg liver ROS,   Endo/Other  Morbid obesity  Renal/GU negative Renal ROS     Musculoskeletal  (+) Arthritis , Osteoarthritis,    Abdominal (+) + obese,   Peds  Hematology negative hematology ROS (+)   Anesthesia Other Findings   Reproductive/Obstetrics 06/07/15 preg test NEG                            Anesthesia Physical Anesthesia Plan  ASA: II  Anesthesia Plan: Spinal   Post-op Pain Management:    Induction: Intravenous  Airway Management Planned: Natural Airway and Simple Face Mask  Additional Equipment:   Intra-op Plan:   Post-operative Plan:   Informed Consent: I have reviewed the patients History and Physical, chart, labs and discussed the procedure including the risks, benefits and alternatives for the proposed anesthesia with the patient or authorized representative who has indicated his/her understanding and acceptance.   Dental advisory given  Plan Discussed with: CRNA and Surgeon  Anesthesia Plan Comments: (Plan routine monitors, SAB)        Anesthesia Quick Evaluation

## 2015-06-18 NOTE — Progress Notes (Signed)
Orthopedic Tech Progress Note Patient Details:  Krystal Mann May 29, 1970 [id number]  CPM Right Knee CPM Right Knee: On Right Knee Flexion (Degrees): 40 Right Knee Extension (Degrees): 0 Additional Comments: trapeze bar patient helper Viewed order from doctor's order list  Hildred Priest 06/18/2015, 2:33 PM

## 2015-06-18 NOTE — Progress Notes (Signed)
Dr. Jenita Seashore, informed that pt. Had sip of OJ this a.m., ( without pulp)

## 2015-06-18 NOTE — Discharge Instructions (Signed)

## 2015-06-18 NOTE — Op Note (Signed)
PATIENT ID:      Krystal Mann  MRN:     [id number] DOB/AGE:    07/09/1970 / 45 y.o.       OPERATIVE REPORT    DATE OF PROCEDURE:  06/18/2015       PREOPERATIVE DIAGNOSIS:   RIGHT KNEE OSTEOARTHRITIS      Estimated body mass index is 35.16 kg/(m^2) as calculated from the following:   Height as of 06/07/15: 5' 7.5" (1.715 m).   Weight as of this encounter: 103.42 kg (228 lb).                                                        POSTOPERATIVE DIAGNOSIS:   RIGHT KNEE OSTEOARTHRITIS                                                                      PROCEDURE:  Procedure(s): TOTAL KNEE ARTHROPLASTY Using DepuyAttune RP implants #6R Femur, #5Tibia, 5 mm Attune RP bearing, 38 Patella     SURGEON: Rainey Rodger J    ASSISTANT:   [employer]   (Present and scrubbed throughout the case, critical for assistance with exposure, retraction, instrumentation, and closure.)         ANESTHESIA: Spinal, 20cc Exparel, 20cc 0.5% Marcaine  EBL: 300  FLUID REPLACEMENT: 1600 crystalloid  TOURNIQUET TIME: 10min  Drains: None  Tranexamic Acid: 1gm iv 2gm topical   COMPLICATIONS:  None         INDICATIONS FOR PROCEDURE: The patient has  RIGHT KNEE OSTEOARTHRITIS, Var deformities, XR shows bone on bone arthritis, lateral subluxation of tibia. Patient has failed all conservative measures including anti-inflammatory medicines, narcotics, attempts at  exercise and weight loss, cortisone injections and viscosupplementation.  Risks and benefits of surgery have been discussed, questions answered.   DESCRIPTION OF PROCEDURE: The patient identified by armband, received  IV antibiotics, in the holding area at [facility]. Patient taken to the operating room, appropriate anesthetic  monitors were attached, and Spinal anesthesia was  induced. Tourniquet  applied high to the operative thigh. Lateral post and foot positioner  applied to the table, the lower extremity was then prepped and draped  in  usual sterile fashion from the toes to the tourniquet. Time-out procedure was performed. We began the operation, with the knee flexed 120 degrees, by making the anterior midline incision starting at handbreadth above the patella going over the patella 1 cm medial to and 4 cm distal to the tibial tubercle. Small bleeders in the skin and the  subcutaneous tissue identified and cauterized. Transverse retinaculum was incised and reflected medially and a medial parapatellar arthrotomy was accomplished. the patella was everted and theprepatellar fat pad resected. The superficial medial collateral  ligament was then elevated from anterior to posterior along the proximal  flare of the tibia and anterior half of the menisci resected. The knee was hyperflexed exposing bone on bone arthritis. Peripheral and notch osteophytes as well as the cruciate ligaments were then resected. We continued to  work our way around posteriorly along the proximal tibia,  and externally  rotated the tibia subluxing it out from underneath the femur. A McHale  retractor was placed through the notch and a lateral Hohmann retractor  placed, and we then drilled through the proximal tibia in line with the  axis of the tibia followed by an intramedullary guide rod and 2-degree  posterior slope cutting guide. The tibial cutting guide, 3 degree posterior sloped, was pinned into place allowing resection of 4 mm of bone medially and 10 mm of bone laterally. Satisfied with the tibial resection, we then  entered the distal femur 2 mm anterior to the PCL origin with the  intramedullary guide rod and applied the distal femoral cutting guide  set at 9 mm, with 5 degrees of valgus. This was pinned along the  epicondylar axis. At this point, the distal femoral cut was accomplished without difficulty. We then sized for a #6L femoral component and pinned the guide in 3 degrees of external rotation. The chamfer cutting guide was pinned into place. The  anterior, posterior, and chamfer cuts were accomplished without difficulty followed by  the Attune RP box cutting guide and the box cut. We also removed posterior osteophytes from the posterior femoral condyles. At this  time, the knee was brought into full extension. We checked our  extension and flexion gaps and found them symmetric for a 5 mm bearing. Distracting in extension with a lamina spreader, the posterior horns of the menisci were removed, and Exparel, diluted to 60 cc, with 20cc NS, and 20cc 0.5% Marcaine,was injected into the capsule and synovium of the knee. The posterior patella cut was accomplished with the 9.5 mm Attune cutting guide, sized for a 74mm dome, and the fixation pegs drilled.The knee  was then once again hyperflexed exposing the proximal tibia. We sized for a # 5 tibial base plate, applied the smokestack and the conical reamer followed by the the Delta fin keel punch. We then hammered into place the Attune RP trial femoral component, drilled the lugs, inserted a  5 mm trial bearing, trial patellar button, and took the knee through range of motion from 0-130 degrees. No thumb pressure was required for patellar Tracking. At this point, the limb was wrapped with an Esmarch bandage and the tourniquet inflated to 350 mmHg. All trial components were removed, mating surfaces irrigated with pulse lavage, and dried with suction and sponges. A double batch of DePuy HV cement with 1500 mg of Zinacef was mixed and applied to all bony metallic mating surfaces except for the posterior condyles of the femur itself. In order, we  hammered into place the tibial tray and removed excess cement, the femoral component and removed excess cement. The final Attune RP bearing  was inserted, and the knee brought to full extension with compression.  The patellar button was clamped into place, and excess cement  removed. While the cement cured the wound was irrigated out with normal saline solution pulse  lavage. Ligament stability and patellar tracking were checked and found to be excellent. The parapatellar arthrotomy was closed with  running #1 Vicryl suture. The subcutaneous tissue with 0 and 2-0 undyed  Vicryl suture, and the skin with running 3-0 SQ vicryl. A dressing of Xeroform,  4 x 4, dressing sponges, Webril, and Ace wrap applied. The patient  awakened, and taken to recovery room without difficulty.   Frederik Pear J 06/18/2015, 12:56 PM

## 2015-06-18 NOTE — Anesthesia Procedure Notes (Addendum)
Procedure Name: Intubation Date/Time: 06/18/2015 11:29 AM Performed by: Lavell Luster Pre-anesthesia Checklist: Patient identified, Emergency Drugs available, Suction available, Patient being monitored and Timeout performed Patient Re-evaluated:Patient Re-evaluated prior to inductionOxygen Delivery Method: Simple face mask Preoxygenation: Pre-oxygenation with 100% oxygen Intubation Type: IV induction Ventilation: Mask ventilation without difficulty Placement Confirmation: breath sounds checked- equal and bilateral and positive ETCO2 Dental Injury: Teeth and Oropharynx as per pre-operative assessment    Spinal Patient location during procedure: OR Staffing Anesthesiologist: Krystin Keeven Preanesthetic Checklist Completed: patient identified, surgical consent, pre-op evaluation, timeout performed, IV checked, risks and benefits discussed and monitors and equipment checked Spinal Block Patient position: sitting Prep: site prepped and draped and DuraPrep Patient monitoring: heart rate, cardiac monitor, continuous pulse ox and blood pressure Approach: midline Location: L3-4 Injection technique: single-shot Needle Needle type: Pencan  Needle gauge: 24 G Needle length: 10 cm Assessment Sensory level: T6

## 2015-06-18 NOTE — Transfer of Care (Signed)
Immediate Anesthesia Transfer of Care Note  [name]  Procedure(s) Performed: Procedure(s): TOTAL KNEE ARTHROPLASTY (Right)  Patient Location: PACU  Anesthesia Type:Spinal  Level of Consciousness: awake, alert  and oriented  Airway & Oxygen Therapy: Patient Spontanous Breathing  Post-op Assessment: Post -op Vital signs reviewed and stable  Post vital signs: stable  Last Vitals:  Filed Vitals:   06/18/15 0948  BP: 113/70  Pulse: 84  Temp: 37 C  Resp: 20    Last Pain: There were no vitals filed for this visit.       Complications: No apparent anesthesia complications

## 2015-06-18 NOTE — Interval H&P Note (Signed)
History and Physical Interval Note:  06/18/2015 9:28 AM  Krystal Mann  has presented today for surgery, with the diagnosis of RIGHT KNEE OSTEOARTHRITIS  The various methods of treatment have been discussed with the patient and family. After consideration of risks, benefits and other options for treatment, the patient has consented to  Procedure(s): TOTAL KNEE ARTHROPLASTY (Right) as a surgical intervention .  The patient's history has been reviewed, patient examined, no change in status, stable for surgery.  I have reviewed the patient's chart and labs.  Questions were answered to the patient's satisfaction.     Kerin Salen

## 2015-06-19 ENCOUNTER — Encounter (HOSPITAL_COMMUNITY): Payer: Self-pay | Admitting: Orthopedic Surgery

## 2015-06-19 LAB — CBC
HCT: 34.8 % — ABNORMAL LOW (ref 36.0–46.0)
Hemoglobin: 11 g/dL — ABNORMAL LOW (ref 12.0–15.0)
MCH: 27.3 pg (ref 26.0–34.0)
MCHC: 31.6 g/dL (ref 30.0–36.0)
MCV: 86.4 fL (ref 78.0–100.0)
PLATELETS: 272 10*3/uL (ref 150–400)
RBC: 4.03 MIL/uL (ref 3.87–5.11)
RDW: 14 % (ref 11.5–15.5)
WBC: 11.1 10*3/uL — AB (ref 4.0–10.5)

## 2015-06-19 LAB — BASIC METABOLIC PANEL
ANION GAP: 7 (ref 5–15)
BUN: 5 mg/dL — AB (ref 6–20)
CO2: 24 mmol/L (ref 22–32)
Calcium: 8.6 mg/dL — ABNORMAL LOW (ref 8.9–10.3)
Chloride: 104 mmol/L (ref 101–111)
Creatinine, Ser: 0.61 mg/dL (ref 0.44–1.00)
Glucose, Bld: 128 mg/dL — ABNORMAL HIGH (ref 65–99)
POTASSIUM: 4 mmol/L (ref 3.5–5.1)
SODIUM: 135 mmol/L (ref 135–145)

## 2015-06-19 NOTE — Care Management Note (Signed)
Case Management Note  Patient Details  Name: Krystal Mann MRN: [id number] Date of Birth: 07-11-1970  Subjective/Objective: 45 y.o. F admitted 06/18/2015 for R TKA. Pt evaluated by PT/OT who recommended pt go to STSNF due to increased pain. CM will defer disposition to CSW at this time.                    Action/Plan: CM will sign off at this time but will be available should disposition needs arise or discharge plans change. Pt had been set up with [facility] [place] to admission so if she does not go to SNF this is the agency of her choosing for [facility] services.   Expected Discharge Date:                  Expected Discharge Plan:  Skilled Nursing Facility  In-House Referral:  Clinical Social Work  Discharge planning Services  CM Consult  Post Acute Care Choice:    Choice offered to:     DME Arranged:    DME Agency:     HH Arranged:    [place] Agency:     Status of Service:  Completed, signed off  Medicare Important Message Given:    Date Medicare IM Given:    Medicare IM give by:    Date Additional Medicare IM Given:    Additional Medicare Important Message give by:     If discussed at [place] of Stay Meetings, dates discussed:    Additional Comments:  Delrae Sawyers, RN 06/19/2015, 3:48 PM

## 2015-06-19 NOTE — Progress Notes (Signed)
Patient stated it hurt when I flushed IV.  I assessed site.  The site is not red, warm, or leaking.  Patient's guest requesting IV team look at the, however patient handled flush and medication well. Informed Levada Dy.  Ilene Qua 06/19/2015 5:25 PM

## 2015-06-19 NOTE — NC FL2 (Signed)
[place] MEDICAID FL2 LEVEL OF CARE SCREENING TOOL     IDENTIFICATION  Patient Name: Krystal Mann Birthdate: 05/25/1970 Sex: female Admission Date (Current Location): 06/18/2015  [facility] and [place] Number:  Herbalist and Address:  [facility] [place], [place] [address] [place], [place] [address]      Provider Number: [id number]  Attending Physician Name and Address:  Frederik Pear, MD  Relative Name and Phone Number:  Elder Love, mother, [phone]    Current Level of Care: Hospital Recommended Level of Care: [place] Prior Approval Number:    Date Approved/Denied:   PASRR Number: [id number]  Discharge Plan: SNF    Current Diagnoses: Patient Active Problem List   Diagnosis Date Noted  . Primary osteoarthritis of right knee 06/15/2015  . Preventative health care 01/01/2015  . Family history of familial polyposis - attenuated - brother 08/31/2014  . Family history of colon cancer 08/30/2014  . Axillary abscess 06/13/2014  . Knee pain, chronic 06/13/2014  . Obesity (BMI 35.0-39.9 without comorbidity) ([place]) 06/13/2014  . Eczema 06/13/2014  . Breast lump on right side at 9 o'clock position 07/27/2012  . Bilateral lower extremity edema 10/10/2011  . Encounter to establish care with new doctor 10/10/2011    Orientation RESPIRATION BLADDER Height & Weight     Self, Time, Situation, Place  Normal Continent Weight: 103.42 kg (228 lb) Height:     BEHAVIORAL SYMPTOMS/MOOD NEUROLOGICAL BOWEL NUTRITION STATUS      Continent Diet (Please see DC Summary)  AMBULATORY STATUS COMMUNICATION OF NEEDS Skin   Extensive Assist Verbally Surgical wounds (Closed incision on knee)                       Personal Care Assistance Level of Assistance  Bathing, Feeding, Dressing Bathing Assistance: Maximum assistance Feeding assistance: Independent Dressing Assistance: Limited assistance     Functional Limitations Info              SPECIAL CARE FACTORS FREQUENCY  PT (By licensed PT)     PT Frequency: 7x/week              Contractures      Additional Factors Info  Code Status, Allergies Code Status Info: Not on File Allergies Info: Hydroxyprogesterone Caproate, Morphine And Related, Hydrocodone           Current Medications (06/19/2015):  This is the current hospital active medication list Current Facility-Administered Medications  Medication Dose Route Frequency Provider Last Rate Last Dose  . acetaminophen (TYLENOL) tablet 650 mg  650 mg Oral Q6H PRN Frederik Pear, MD   650 mg at 06/18/15 1512   Or  . acetaminophen (TYLENOL) suppository 650 mg  650 mg Rectal Q6H PRN Frederik Pear, MD      . alum & mag hydroxide-simeth (MAALOX/MYLANTA) 200-200-20 MG/5ML suspension 30 mL  30 mL Oral Q4H PRN Frederik Pear, MD      . aspirin EC tablet 325 mg  325 mg Oral Q breakfast Frederik Pear, MD   325 mg at 06/19/15 0745  . bisacodyl (DULCOLAX) EC tablet 5 mg  5 mg Oral Daily PRN Frederik Pear, MD      . clindamycin (CLEOCIN T) 1 % lotion 1 application  1 application Topical [id number] Frederik Pear, MD   1 application at [id number]  . dextrose 5 % and 0.45 % NaCl with KCl 20 mEq/L infusion   Intravenous Continuous Frederik Pear, MD 125 mL/hr at 06/19/15  [id number]    . diphenhydrAMINE (BENADRYL) 12.5 MG/5ML elixir 12.5-25 mg  12.5-25 mg Oral Q4H PRN Frederik Pear, MD   12.5 mg at 06/19/15 1110  . docusate sodium (COLACE) capsule 100 mg  100 mg Oral BID Frederik Pear, MD   100 mg at 06/19/15 0745  . fluticasone (FLONASE) 50 MCG/ACT nasal spray 2 spray  2 spray Each Nare Daily Frederik Pear, MD   2 spray at 06/19/15 1106  . HYDROmorphone (DILAUDID) injection 0.5-1 mg  0.5-1 mg Intravenous Q2H PRN Frederik Pear, MD   1 mg at 06/19/15 1442  . menthol-cetylpyridinium (CEPACOL) lozenge 3 mg  1 lozenge Oral PRN Frederik Pear, MD       Or  . phenol (CHLORASEPTIC) mouth spray 1 spray  1 spray Mouth/Throat PRN Frederik Pear, MD      . methocarbamol  (ROBAXIN) tablet 500 mg  500 mg Oral Q6H PRN Frederik Pear, MD   500 mg at 06/19/15 1418   Or  . methocarbamol (ROBAXIN) 500 mg in dextrose 5 % 50 mL IVPB  500 mg Intravenous Q6H PRN Frederik Pear, MD      . metoCLOPramide (REGLAN) tablet 5-10 mg  5-10 mg Oral Q8H PRN Frederik Pear, MD       Or  . metoCLOPramide (REGLAN) injection 5-10 mg  5-10 mg Intravenous Q8H PRN Frederik Pear, MD      . naphazoline-glycerin (CLEAR EYES) ophth solution 1-2 drop  1-2 drop Both Eyes Daily PRN Frederik Pear, MD      . ondansetron [facility]) tablet 4 mg  4 mg Oral Q6H PRN Frederik Pear, MD       Or  . ondansetron [facility]) injection 4 mg  4 mg Intravenous Q6H PRN Frederik Pear, MD   4 mg at 06/19/15 0810  . oxyCODONE (Oxy IR/ROXICODONE) immediate release tablet 5-10 mg  5-10 mg Oral Q3H PRN Frederik Pear, MD   10 mg at 06/19/15 1417  . senna-docusate (Senokot-S) tablet 1 tablet  1 tablet Oral QHS PRN Frederik Pear, MD      . sodium phosphate (FLEET) 7-19 GM/118ML enema 1 enema  1 enema Rectal Once PRN Frederik Pear, MD         Discharge Medications: Please see discharge summary for a list of discharge medications.  Relevant Imaging Results:  Relevant Lab Results:   Additional Information SSN: Gratis [place], [place]

## 2015-06-19 NOTE — Progress Notes (Signed)
Pt refusing attempt to ambulate during second PT session, reports having too much pain. Able to initiate HEP. Recommendation for SNF remains appropriate.    06/19/15 1407  PT Visit Information  Last PT Received On 06/19/15  Assistance Needed +1  History of Present Illness Pt s/p RTKA.PMH: back pain, cervical discectomy, depression.   Subjective Data  Subjective Stayed up in the chair for about an hour  Patient Stated Goal feel better  Precautions  Precautions Knee;Fall  Precaution Booklet Issued Yes (comment)  Precaution Comments HEP provided  Restrictions  Weight Bearing Restrictions Yes  RLE Weight Bearing WBAT  Pain Assessment  Pain Assessment 0-10  Pain Score 6  Pain Location Rt knee  Pain Descriptors / Indicators Aching  Pain Intervention(s) Limited activity within patient's tolerance;Monitored during session;Patient requesting pain meds-RN notified;Ice applied  Nurse, mental health;Suspect due to medications  Behavior During Therapy [facility] for tasks assessed/performed  Overall Cognitive Status Within Functional Limits for tasks assessed  Bed Mobility  General bed mobility comments Pt declined sit or attempting ambulation.   Exercises  Exercises Total Joint  Total Joint Exercises  Ankle Circles/Pumps AROM;Both;10 reps (frequent cues to attend to task)  Quad Sets Strengthening;Right;10 reps (repeated cues to stay awake)  Heel Slides AAROM;Right;10 reps  Straight Leg Raises Strengthening;Right;10 reps (max assist)  Goniometric ROM 12-44 degrees  PT - End of Session  Equipment Utilized During Treatment Gait belt  Activity Tolerance Patient limited by pain;Patient limited by lethargy  Patient left in bed;with call bell/phone within reach;in CPM  Nurse Communication Mobility status;Weight bearing status  PT - Assessment/Plan  PT Plan Current plan remains appropriate  PT Frequency (ACUTE ONLY) 7X/week  Follow Up Recommendations SNF;Supervision for  mobility/OOB  PT equipment None recommended by PT  PT Goal Progression  Progress towards PT goals Progressing toward goals  Acute Rehab PT Goals  PT Goal Formulation With patient  Time For Goal Achievement 07/03/15  Potential to Achieve Goals Good  PT Time Calculation  PT Start Time (ACUTE ONLY) 1344  PT Stop Time (ACUTE ONLY) 1402  PT Time Calculation (min) (ACUTE ONLY) 18 min  PT General Charges  $$ ACUTE PT VISIT 1 Procedure  PT Treatments  $Therapeutic Exercise 8-22 mins  Cassell Clement, PT, CSCS Pager [phone] Office [phone]

## 2015-06-19 NOTE — Clinical Social Work Placement (Signed)
   CLINICAL SOCIAL WORK PLACEMENT  NOTE  Date:  06/19/2015  Patient Details  Name: Krystal Mann MRN: [id number] Date of Birth: 26-Oct-1970  Clinical Social Work is seeking post-discharge placement for this patient at the [place] level of care (*CSW will initial, date and re-position this form in  chart as items are completed):      Patient/family provided with [facility] Work Department's list of facilities offering this level of care within the geographic area requested by the patient (or if unable, by the patient's family).      Patient/family informed of their freedom to choose among providers that offer the needed level of care, that participate in Medicare, Medicaid or managed care program needed by the patient, have an available bed and are willing to accept the patient.      Patient/family informed of [facility]'s ownership interest in [facility] and [facility], as well as of the fact that they are under no obligation to receive care at these facilities.  PASRR submitted to EDS on 06/19/15     PASRR number received on 06/19/15     Existing PASRR number confirmed on       FL2 transmitted to all facilities in geographic area requested by pt/family on 06/19/15     FL2 transmitted to all facilities within larger geographic area on       Patient informed that his/her managed care company has contracts with or will negotiate with certain facilities, including the following:            Patient/family informed of bed offers received.  Patient chooses bed at       Physician recommends and patient chooses bed at      Patient to be transferred to   on  .  Patient to be transferred to facility by       Patient family notified on   of transfer.  Name of family member notified:        PHYSICIAN Please sign FL2     Additional Comment:    _______________________________________________ Benard Halsted, [place] 06/19/2015, 2:59 PM

## 2015-06-19 NOTE — Progress Notes (Signed)
Patient ID: Krystal Mann, female   DOB: 03-23-1970, 45 y.o.   MRN: [id number] PATIENT ID: Krystal Mann  MRN: [id number]  DOB/AGE:  08-25-1970 / 45 y.o.  1 Day Post-Op Procedure(s) (LRB): TOTAL KNEE ARTHROPLASTY (Right)    PROGRESS NOTE Subjective: Patient is alert, oriented, no Nausea, no Vomiting, yes passing gas. Taking PO well. Denies SOB, Chest or Calf Pain. Using Incentive Spirometer, PAS in place. Ambulate WBAT today, CPM 0-40 Patient reports pain as 5/10 .    Objective: Vital signs in last 24 hours: Filed Vitals:   06/18/15 1641 06/18/15 2051 06/18/15 2336 06/19/15 0500  BP: 103/71 131/82 125/72 125/81  Pulse: 75 74 64 71  Temp: 98 F (36.7 C) 97.7 F (36.5 C) 97.9 F (36.6 C) 97.6 F (36.4 C)  TempSrc: Oral Oral Oral Oral  Resp: 16 17 17 17   Weight:      SpO2: 95% 98% 99% 99%      Intake/Output from previous day: I/O last 3 completed shifts: In: 2522.9 [P.O.:50; I.V.:2472.9] Out: 750 [Urine:700; Blood:50]   Intake/Output this shift:     LABORATORY DATA:  Recent Labs  06/18/15 1907 06/19/15 0430  WBC  --  11.1*  HGB  --  11.0*  HCT  --  34.8*  PLT  --  272  NA  --  135  K  --  4.0  CL  --  104  CO2  --  24  BUN  --  5*  CREATININE  --  0.61  GLUCOSE  --  128*  GLUCAP 162*  --   CALCIUM  --  8.6*    Examination: Neurologically intact ABD soft Neurovascular intact Sensation intact distally Intact pulses distally Dorsiflexion/Plantar flexion intact Incision: dressing C/D/I No cellulitis present Compartment soft}  Assessment:   1 Day Post-Op Procedure(s) (LRB): TOTAL KNEE ARTHROPLASTY (Right) ADDITIONAL DIAGNOSIS: Expected Acute Blood Loss Anemia, morbid o0besity  Plan: PT/OT WBAT, CPM 5/hrs day until ROM 0-90 degrees, then D/C CPM DVT Prophylaxis:  SCDx72hrs, ASA 325 mg BID x 2 weeks DISCHARGE PLAN: Skilled Nursing Facility/Rehab,[address] probably tomorrow DISCHARGE NEEDS: HHPT, CPM, Walker and 3-in-1 comode seat      Kalei Meda J 06/19/2015, 7:04 AM

## 2015-06-19 NOTE — Progress Notes (Signed)
3Occupational Therapy Treatment Patient Details Name: Krystal Mann MRN: [id number] DOB: 02-10-1970 Today's Date: 06/19/2015    History of present illness Pt s/p RTKA.PMH: back pain, cervical discectomy, depression.    OT comments  This 45 yo female admitted and underwent above presents to acute OT this PM with not making progress due to increased pain over the AM; however pt really wanted to get up [facility]. Pt will continue to benefit from OT.  Follow Up Recommendations  SNF    Equipment Recommendations   (TBD next venue)       Precautions / Restrictions Precautions Precautions: Knee;Fall Precaution Booklet Issued: Yes (comment) Precaution Comments: HEP provided Restrictions Weight Bearing Restrictions: Yes RLE Weight Bearing: Weight bearing as tolerated       Mobility Bed Mobility Overal bed mobility: Needs Assistance Bed Mobility: Supine to Sit;Sit to Supine     Supine to sit: Min assist;HOB elevated (A for RLE only due to increased pain than the AM) Sit to supine: Mod assist (A for Bil LEs due to increased pain over the AM)    Transfers Overall transfer level: Needs assistance Equipment used: Rolling walker (2 wheeled) Transfers: Sit to/from [facility] Sit to Stand: Min assist Stand pivot transfers: Min assist          Balance Overall balance assessment: Needs assistance Sitting-balance support: No upper extremity supported;Feet supported Sitting balance-Leahy Scale: Fair     Standing balance support: Bilateral upper extremity supported;During functional activity Standing balance-Leahy Scale: Poor Standing balance comment: reliant on RW                   ADL Overall ADL's : Needs assistance/impaired                         Toilet Transfer: Minimal assistance;Stand-pivot;BSC;RW   Toileting- Clothing Manipulation and Hygiene: Min guard;Sitting/lateral lean                          Cognition   Behavior  During Therapy: WFL for tasks assessed/performed Overall Cognitive Status: Within Functional Limits for tasks assessed                       Extremity/Trunk Assessment  Upper Extremity Assessment Upper Extremity Assessment: Defer to OT evaluation                 Pertinent Vitals/ Pain       Pain Assessment: 0-10 Pain Score: 10-Worst pain ever Pain Location: right knee Pain Descriptors / Indicators: Throbbing;Sore;Aching;Grimacing;Guarding Pain Intervention(s): Monitored during session;Repositioned;Limited activity within patient's tolerance;Ice applied         Frequency Min 2X/week     Progress Toward Goals  OT Goals(current goals can now be found in the care plan section)  Progress towards OT goals: Not progressing toward goals - comment (due to pain)  Acute Rehab OT Goals Patient Stated Goal: feel better  Plan Discharge plan remains appropriate          Activity Tolerance Patient limited by pain   Patient Left in bed;with call bell/phone within reach;with nursing/sitter in room   Nurse Communication  (RN in room )        Time: [phone] OT Time Calculation (min): 12 min  Charges: OT General Charges $OT Visit: 1 Procedure OT Treatments $Self Care/Home Management : 8-22 mins  Almon Register [id number] 06/19/2015, 2:58 PM

## 2015-06-19 NOTE — Evaluation (Signed)
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 Pt will benefit from acute OT with follow up OT at SNF to get to a Mod I level or better to return home.    Follow Up Recommendations  SNF    Equipment Recommendations   (TBD next venue)       Precautions / Restrictions Precautions Precautions: Fall Restrictions Weight Bearing Restrictions: Yes RLE Weight Bearing: Weight bearing as tolerated      Mobility Bed Mobility Overal bed mobility: Needs Assistance Bed Mobility: Supine to Sit     Supine to sit: Min guard;HOB elevated     General bed mobility comments: use of rail and trapeze bar  Transfers Overall transfer level: Needs assistance Equipment used: Rolling walker (2 wheeled) Transfers: Sit to/from [facility] Sit to Stand: Min assist Stand pivot transfers: Min assist            Balance Overall balance assessment: Needs assistance Sitting-balance support: No upper extremity supported;Feet supported Sitting balance-Leahy Scale: Fair     Standing balance support: Bilateral upper extremity supported;During functional activity Standing balance-Leahy Scale: Poor Standing balance comment: reliant on RW                            ADL   Eating/Feeding: Independent;Sitting   Grooming: Set up;Sitting   Upper Body Bathing: Set up;Sitting   Lower Body Bathing: Moderate assistance (min A sit<>stand)   Upper Body Dressing : Set up;Sitting   Lower Body Dressing: Maximal assistance (min A sit<>stand)   Toilet Transfer: Minimal assistance;Stand-pivot;BSC;RW   Toileting-  Clothing Manipulation and Hygiene: Min guard;Sitting/lateral lean                         Pertinent Vitals/Pain Pain Assessment: 0-10 Pain Score: 6  (with ambulation) Pain Location: right knee Pain Descriptors / Indicators: Grimacing;Guarding;Sore Pain Intervention(s): Limited activity within patient's tolerance;Monitored during session;Repositioned;RN gave pain meds during session     Hand Dominance Right   Extremity/Trunk Assessment Upper Extremity Assessment Upper Extremity Assessment: Overall WFL for tasks assessed   Lower Extremity Assessment Lower Extremity Assessment: Defer to PT evaluation   Cervical / Trunk Assessment Cervical / Trunk Assessment: Normal   Communication Communication Communication: No difficulties   Cognition Arousal/Alertness: Lethargic;Suspect due to medications Behavior During Therapy: [facility] for tasks assessed/performed Overall Cognitive Status: Within Functional Limits for tasks assessed                                Home Living Family/patient expects to be discharged to:: Skilled nursing facility                                        Prior Functioning/Environment Level of Independence: Independent             OT Diagnosis: Generalized weakness;Acute pain   OT Problem List: Decreased strength;Decreased range of motion;Impaired balance (sitting and/or standing);Pain;Decreased  knowledge of precautions;Obesity;Decreased knowledge of use of DME or AE   OT Treatment/Interventions: Self-care/ADL training;Patient/family education;Balance training;Therapeutic activities;DME and/or AE instruction    OT Goals(Current goals can be found in the care plan section) Acute Rehab OT Goals Patient Stated Goal: to rehab then home OT Goal Formulation: With patient Time For Goal Achievement: 06/26/15 Potential to Achieve Goals: Good  OT Frequency: Min 2X/week   Barriers to D/C: Decreased caregiver support           Co-evaluation PT/OT/SLP Co-Evaluation/Treatment: Yes Reason for Co-Treatment: For patient/therapist safety          End of Session Equipment Utilized During Treatment: Gait belt;Rolling walker Nurse Communication: Mobility status  Activity Tolerance: Patient tolerated treatment well Patient left: in chair;with call bell/phone within reach;with chair alarm set   Time: [id number] OT Time Calculation (min): 28 min Charges:  OT General Charges $OT Visit: 1 Procedure OT Evaluation $OT Eval Moderate Complexity: 1 Procedure  Almon Register [id number] 06/19/2015, 10:51 AM

## 2015-06-19 NOTE — Anesthesia Postprocedure Evaluation (Signed)
Anesthesia Post Note  [name]  Procedure(s) Performed: Procedure(s) (LRB): TOTAL KNEE ARTHROPLASTY (Right)  Patient location during evaluation: PACU Anesthesia Type: Spinal Level of consciousness: awake Pain management: pain level controlled Vital Signs Assessment: post-procedure vital signs reviewed and stable Respiratory status: spontaneous breathing Cardiovascular status: stable Postop Assessment: no signs of nausea or vomiting and spinal receding Anesthetic complications: no    Last Vitals:  Filed Vitals:   06/19/15 0500 06/19/15 1300  BP: 125/81 131/84  Pulse: 71 75  Temp: 36.4 C 36.8 C  Resp: 17 16    Last Pain:  Filed Vitals:   06/19/15 1814  PainSc: 8                  Creston Klas

## 2015-06-19 NOTE — Evaluation (Signed)
Physical Therapy Evaluation Patient Details Name: Krystal Mann MRN: [id number] DOB: 1970/11/20 Today's Date: 06/19/2015   History of Present Illness  Pt s/p RTKA  Clinical Impression  Pt is s/p TKA resulting in the deficits listed below (see PT Problem List).  Pt will benefit from skilled PT to increase their independence and safety with mobility to allow discharge to SNF when medically stable.       Follow Up Recommendations SNF;Supervision for mobility/OOB    Equipment Recommendations   (to be addressed at next venue)    Recommendations for Other Services       Precautions / Restrictions Precautions Precautions: Knee;Fall Precaution Booklet Issued: No Precaution Comments: discussed knee extension precautions Restrictions Weight Bearing Restrictions: Yes RLE Weight Bearing: Weight bearing as tolerated      Mobility  Bed Mobility Overal bed mobility: Needs Assistance Bed Mobility: Supine to Sit     Supine to sit: Min guard;HOB elevated     General bed mobility comments: use of rail and trapeze bar  Transfers Overall transfer level: Needs assistance Equipment used: Rolling walker (2 wheeled) Transfers: Sit to/from Stand Sit to Stand: Min assist Stand pivot transfers: Min assist       General transfer comment: cues for hand position, bed-BSC-chair  Ambulation/Gait Ambulation/Gait assistance: Min assist Ambulation Distance (Feet): 10 Feet Assistive device: Rolling walker (2 wheeled) Gait Pattern/deviations: Step-through pattern Gait velocity: decreased   General Gait Details: cues for weightbearing, even strides.   Stairs            Wheelchair Mobility    Modified Rankin (Stroke Patients Only)       Balance Overall balance assessment: Needs assistance Sitting-balance support: No upper extremity supported Sitting balance-Leahy Scale: Fair     Standing balance support: Bilateral upper extremity supported Standing balance-Leahy Scale:  Poor Standing balance comment: using rw for support                             Pertinent Vitals/Pain Pain Assessment: 0-10 Pain Score: 6  Pain Location: rt knee Pain Descriptors / Indicators: Aching Pain Intervention(s): Limited activity within patient's tolerance;Monitored during session    Home Living Family/patient expects to be discharged to:: Skilled nursing facility                      Prior Function Level of Independence: Independent with assistive device(s)         Comments: using SPC for ambulation     Hand Dominance       Extremity/Trunk Assessment   Upper Extremity Assessment: Defer to OT evaluation           Lower Extremity Assessment: RLE deficits/detail RLE Deficits / Details: unable to perform SLR    Cervical / Trunk Assessment: Normal  Communication   Communication: No difficulties  Cognition Arousal/Alertness: Lethargic;Suspect due to medications Behavior During Therapy: [facility] for tasks assessed/performed Overall Cognitive Status: Within Functional Limits for tasks assessed                      General Comments      Exercises        Assessment/Plan    PT Assessment Patient needs continued PT services  PT Diagnosis Difficulty walking   PT Problem List Decreased strength;Decreased activity tolerance;Decreased range of motion;Decreased balance;Decreased mobility;Decreased coordination  PT Treatment Interventions DME instruction;Gait training;Stair training;Functional mobility training;Therapeutic activities;Therapeutic exercise;Patient/family education   PT Goals (  Current goals can be found in the Care Plan section) Acute Rehab PT Goals Patient Stated Goal: get more therapy at a rehab facility before going home PT Goal Formulation: With patient Time For Goal Achievement: 07/03/15 Potential to Achieve Goals: Good    Frequency 7X/week   Barriers to discharge        Co-evaluation PT/OT/SLP  Co-Evaluation/Treatment: Yes Reason for Co-Treatment: For patient/therapist safety PT goals addressed during session: Mobility/safety with mobility         End of Session Equipment Utilized During Treatment: Gait belt Activity Tolerance: Patient tolerated treatment well;Patient limited by fatigue;Patient limited by lethargy Patient left: in chair;with call bell/phone within reach (in knee extension ) Nurse Communication: Mobility status;Weight bearing status         Time: [id number] PT Time Calculation (min) (ACUTE ONLY): 28 min   Charges:   PT Evaluation $PT Eval Moderate Complexity: 1 Procedure     PT G Codes:        Cassell Clement, PT, CSCS Pager [phone] Office [phone]  06/19/2015, 1:00 PM

## 2015-06-19 NOTE — Progress Notes (Signed)
Orthopedic Tech Progress Note Patient Details:  Krystal Mann 1970/08/27 [id number]  CPM Right Knee CPM Right Knee: On Right Knee Flexion (Degrees): 40 Right Knee Extension (Degrees): 0 Additional Comments: trapeze bar helper   [name] 06/19/2015, 6:58 PM

## 2015-06-19 NOTE — Clinical Social Work Note (Signed)
LCSW following for discharge planning. Full assessment to follow.  Lubertha Sayres, [facility]: [phone] Surgical: [phone]

## 2015-06-20 ENCOUNTER — Encounter (HOSPITAL_COMMUNITY): Payer: Self-pay | Admitting: General Practice

## 2015-06-20 LAB — CBC
HCT: 35.5 % — ABNORMAL LOW (ref 36.0–46.0)
Hemoglobin: 11.2 g/dL — ABNORMAL LOW (ref 12.0–15.0)
MCH: 27.7 pg (ref 26.0–34.0)
MCHC: 31.5 g/dL (ref 30.0–36.0)
MCV: 87.7 fL (ref 78.0–100.0)
PLATELETS: 264 10*3/uL (ref 150–400)
RBC: 4.05 MIL/uL (ref 3.87–5.11)
RDW: 14 % (ref 11.5–15.5)
WBC: 10 10*3/uL (ref 4.0–10.5)

## 2015-06-20 NOTE — Progress Notes (Signed)
Physical Therapy Treatment Patient Details Name: Krystal Mann MRN: [id number] DOB: 1970-08-09 Today's Date: 06/20/2015    History of Present Illness Pt s/p RTKA.PMH: back pain, cervical discectomy, depression.     PT Comments    Pt continues to progress slowly but making gains. Able to ambulate 30 feet with rw and min guard assist. Knee flexion remains limited, attempting to progress with HEP and during functional activities. PT to continue to follow, recommending SNF following acute stay.   Follow Up Recommendations  SNF;Supervision for mobility/OOB     Equipment Recommendations  None recommended by PT    Recommendations for Other Services       Precautions / Restrictions Precautions Precautions: Knee;Fall Restrictions Weight Bearing Restrictions: Yes RLE Weight Bearing: Weight bearing as tolerated    Mobility  Bed Mobility Overal bed mobility: Needs Assistance Bed Mobility: Supine to Sit     Supine to sit: Min assist (Rt LE) Sit to supine: Min assist (LLE, using trapeze to assist)   General bed mobility comments: HOB elevated, pt using rail to assist  Transfers Overall transfer level: Needs assistance Equipment used: Rolling walker (2 wheeled) Transfers: Sit to/from Stand Sit to Stand: Min guard         General transfer comment: encouraging knee flexion while standing and sitting.   Ambulation/Gait Ambulation/Gait assistance: Min guard Ambulation Distance (Feet): 30 Feet Assistive device: Rolling walker (2 wheeled) Gait Pattern/deviations: Step-to pattern;Decreased stance time - right;Decreased weight shift to right Gait velocity: decreased   General Gait Details: Pt with minimal weighbearing through Rt LE due to reports of pain.    Stairs            Wheelchair Mobility    Modified Rankin (Stroke Patients Only)       Balance Overall balance assessment: Needs assistance Sitting-balance support: No upper extremity supported Sitting  balance-Leahy Scale: Good     Standing balance support: Bilateral upper extremity supported Standing balance-Leahy Scale: Poor Standing balance comment: using rw                    Cognition Arousal/Alertness: Awake/alert Behavior During Therapy: WFL for tasks assessed/performed Overall Cognitive Status: Within Functional Limits for tasks assessed                      Exercises Total Joint Exercises Ankle Circles/Pumps: AROM;Both;10 reps Quad Sets: Strengthening;Right;10 reps Heel Slides: AAROM;Right;10 reps Straight Leg Raises: Strengthening;Right;10 reps (max assist) Goniometric ROM: knee flexion 32 degrees.     General Comments        Pertinent Vitals/Pain Pain Assessment: Faces Pain Score: 7  Faces Pain Scale: Hurts whole lot Pain Location: Rt knee Pain Descriptors / Indicators: Aching Pain Intervention(s): Limited activity within patient's tolerance;Monitored during session;RN gave pain meds during session    Home Living                      Prior Function            PT Goals (current goals can now be found in the care plan section) Acute Rehab PT Goals Patient Stated Goal: less pain and nausea PT Goal Formulation: With patient Time For Goal Achievement: 07/03/15 Potential to Achieve Goals: Good Progress towards PT goals: Progressing toward goals    Frequency  7X/week    PT Plan Current plan remains appropriate    Co-evaluation             End of Session  Equipment Utilized During Treatment: Gait belt Activity Tolerance: Patient limited by pain Patient left: in bed;in CPM;with call bell/phone within reach;with family/visitor present     Time: [id number] PT Time Calculation (min) (ACUTE ONLY): 36 min  Charges:  $Gait Training: 8-22 mins $Therapeutic Exercise: 8-22 mins                    G Codes:      Cassell Clement, PT, CSCS Pager [phone] Office [phone]  06/20/2015, 3:25 PM

## 2015-06-20 NOTE — Progress Notes (Signed)
Physical Therapy Treatment Patient Details Name: Krystal Mann MRN: [id number] DOB: [birth year] Today's Date: 06/20/2015    History of Present Illness Pt s/p RTKA.PMH: back pain, cervical discectomy, depression.     PT Comments    Pt making gradual progress with PT, able to increase ambulation to 25 feet with rw and min guard assistance. Knee flexion more limited today, encouraging pt to attempt to bend knee with sitting/standing and with functional mobility in addition to HEP. Continue to recommend SNF for further rehabilitation following D/C from acute care.   Follow Up Recommendations  SNF;Supervision for mobility/OOB     Equipment Recommendations  None recommended by PT    Recommendations for Other Services       Precautions / Restrictions Precautions Precautions: Knee;Fall Restrictions Weight Bearing Restrictions: Yes RLE Weight Bearing: Weight bearing as tolerated    Mobility  Bed Mobility Overal bed mobility: Needs Assistance Bed Mobility: Supine to Sit     Supine to sit: Min assist (min assist with Rt LE)     General bed mobility comments: HOB elevated, pt using rail to assist  Transfers Overall transfer level: Needs assistance Equipment used: Rolling walker (2 wheeled) Transfers: Sit to/from Stand Sit to Stand: Min guard         General transfer comment: encouraging knee flexion while standing and sitting.   Ambulation/Gait Ambulation/Gait assistance: Min guard Ambulation Distance (Feet): 25 Feet Assistive device: Rolling walker (2 wheeled) Gait Pattern/deviations: Step-through pattern;Decreased stance time - right;Decreased weight shift to right Gait velocity: decreased   General Gait Details: cues for weightbearing through Rt LE as tolerated.    Stairs            Wheelchair Mobility    Modified Rankin (Stroke Patients Only)       Balance Overall balance assessment: Needs assistance Sitting-balance support: No upper extremity  supported Sitting balance-Leahy Scale: Good     Standing balance support: Bilateral upper extremity supported Standing balance-Leahy Scale: Poor Standing balance comment: using rw                    Cognition Arousal/Alertness: Awake/alert Behavior During Therapy: WFL for tasks assessed/performed Overall Cognitive Status: Within Functional Limits for tasks assessed                      Exercises Total Joint Exercises Ankle Circles/Pumps: AROM;Both;10 reps Quad Sets: Strengthening;Right;10 reps Heel Slides: AAROM;Right;10 reps Straight Leg Raises: Strengthening;Right;10 reps (max assist) Goniometric ROM: knee flexion approx. 30 degrees    General Comments        Pertinent Vitals/Pain Pain Assessment: 0-10 Pain Score: 7  Pain Location: Rt knee Pain Descriptors / Indicators: Aching;Tightness Pain Intervention(s): Limited activity within patient's tolerance;Monitored during session;RN gave pain meds during session    Home Living                      Prior Function            PT Goals (current goals can now be found in the care plan section) Acute Rehab PT Goals Patient Stated Goal: less pain and nausea PT Goal Formulation: With patient Time For Goal Achievement: 07/03/15 Potential to Achieve Goals: Good Progress towards PT goals: Progressing toward goals    Frequency  7X/week    PT Plan Current plan remains appropriate    Co-evaluation             End of Session Equipment Utilized During Treatment:  Gait belt Activity Tolerance: Patient limited by pain Patient left: in chair;with call bell/phone within reach (in knee extension )     Time: [id number] PT Time Calculation (min) (ACUTE ONLY): 30 min  Charges:  $Gait Training: 8-22 mins $Therapeutic Exercise: 8-22 mins                    G Codes:      Cassell Clement, PT, CSCS Pager [phone] Office [phone]  06/20/2015, 12:18 PM

## 2015-06-20 NOTE — Progress Notes (Signed)
PATIENT ID: Krystal Mann  MRN: [id number]  DOB/AGE:  November 15, 1970 / 45 y.o.  2 Days Post-Op Procedure(s) (LRB): TOTAL KNEE ARTHROPLASTY (Right)    PROGRESS NOTE Subjective: Patient is alert, oriented, no Nausea, no Vomiting, yes passing gas. Taking PO with small bites. Denies SOB, Chest or Calf Pain. Using Incentive Spirometer, PAS in place. Ambulate WBAT with pt walking 10 ft., CPM 0-40 Patient reports pain as 4/10 .    Objective: Vital signs in last 24 hours: Filed Vitals:   06/18/15 2336 06/19/15 0500 06/19/15 1300 06/20/15 0440  BP: 125/72 125/81 131/84 134/74  Pulse: 64 71 75 75  Temp: 97.9 F (36.6 C) 97.6 F (36.4 C) 98.2 F (36.8 C) 98 F (36.7 C)  TempSrc: Oral Oral Oral Oral  Resp: 17 17 16 16   Weight:      SpO2: 99% 99% 98% 98%      Intake/Output from previous day: I/O last 3 completed shifts: In: 2242.9 [P.O.:770; I.V.:1472.9] Out: 800 [Urine:800]   Intake/Output this shift:     LABORATORY DATA:  Recent Labs  06/18/15 1907 06/19/15 0430 06/20/15 0441  WBC  --  11.1* 10.0  HGB  --  11.0* 11.2*  HCT  --  34.8* 35.5*  PLT  --  272 264  NA  --  135  --   K  --  4.0  --   CL  --  104  --   CO2  --  24  --   BUN  --  5*  --   CREATININE  --  0.61  --   GLUCOSE  --  128*  --   GLUCAP 162*  --   --   CALCIUM  --  8.6*  --     Examination: Neurologically intact Neurovascular intact Sensation intact distally Intact pulses distally Dorsiflexion/Plantar flexion intact Incision: dressing C/D/I and no drainage No cellulitis present Compartment soft}  Assessment:   2 Days Post-Op Procedure(s) (LRB): TOTAL KNEE ARTHROPLASTY (Right) ADDITIONAL DIAGNOSIS: Expected Acute Blood Loss Anemia,  Morbid obesity  Plan: PT/OT WBAT, CPM 5/hrs day until ROM 0-90 degrees, then D/C CPM DVT Prophylaxis:  SCDx72hrs, ASA 325 mg BID x 2 weeks DISCHARGE PLAN: Skilled Nursing Facility/Rehab, [facility] when bed available DISCHARGE NEEDS: HHPT, CPM, Walker and  3-in-1 comode seat     Krystal Mann R 06/20/2015, 7:33 AM

## 2015-06-21 LAB — CBC
HEMATOCRIT: 32.3 % — AB (ref 36.0–46.0)
Hemoglobin: 10.2 g/dL — ABNORMAL LOW (ref 12.0–15.0)
MCH: 27.9 pg (ref 26.0–34.0)
MCHC: 31.6 g/dL (ref 30.0–36.0)
MCV: 88.5 fL (ref 78.0–100.0)
Platelets: 231 10*3/uL (ref 150–400)
RBC: 3.65 MIL/uL — ABNORMAL LOW (ref 3.87–5.11)
RDW: 14 % (ref 11.5–15.5)
WBC: 8.5 10*3/uL (ref 4.0–10.5)

## 2015-06-21 NOTE — Discharge Summary (Signed)
Patient ID: Krystal Mann MRN: [id number] DOB/AGE: 03/07/1970 45 y.o.  Admit date: 06/18/2015 Discharge date: 06/21/2015  Admission Diagnoses:  Principal Problem:   Primary osteoarthritis of right knee   Discharge Diagnoses:  Same  Past Medical History  Diagnosis Date  . Anemia   . Depression 2004  . Arthritis     knee  . Back pain   . Rotator cuff (capsule) sprain     right  . Chicken pox   . Diverticulitis   . Migraines     no longer has them. Was medication related  . Shortness of breath dyspnea     occasionally  . Seasonal allergies     Surgeries: Procedure(s): TOTAL KNEE ARTHROPLASTY on 06/18/2015   Consultants:    Discharged Condition: Improved  Hospital Course: Krystal Mann is an 45 y.o. female who was admitted 06/18/2015 for operative treatment ofPrimary osteoarthritis of right knee. Patient has severe unremitting pain that affects sleep, daily activities, and work/hobbies. After pre-op clearance the patient was taken to the operating room on 06/18/2015 and underwent  Procedure(s): TOTAL KNEE ARTHROPLASTY.    Patient was given perioperative antibiotics: Anti-infectives    Start     Dose/Rate Route Frequency Ordered Stop   06/18/15 1201  cefUROXime (ZINACEF) injection  Status:  Discontinued       As needed 06/18/15 1201 06/18/15 1341   06/18/15 0718  ceFAZolin (ANCEF) IVPB 2g/100 mL premix     2 g 200 mL/hr over 30 Minutes Intravenous On call to O.R. 06/18/15 0718 06/18/15 1130       Patient was given sequential compression devices, early ambulation, and chemoprophylaxis to prevent DVT.  Patient benefited maximally from hospital stay and there were no complications.  Ambulated 30 feet with physical therapy day prior to discharge final hemoglobin was 10.2  Recent vital signs: Patient Vitals for the past 24 hrs:  BP Temp Temp src Pulse Resp SpO2  06/21/15 0500 130/70 mmHg 98 F (36.7 C) Oral 89 16 92 %  06/20/15 1900 126/76 mmHg 98.8 F (37.1 C) Oral (!)  106 16 92 %  06/20/15 1300 130/75 mmHg 98.7 F (37.1 C) Oral 88 16 98 %     Recent laboratory studies:  Recent Labs  06/19/15 0430 06/20/15 0441 06/21/15 0400  WBC 11.1* 10.0 8.5  HGB 11.0* 11.2* 10.2*  HCT 34.8* 35.5* 32.3*  PLT 272 264 231  NA 135  --   --   K 4.0  --   --   CL 104  --   --   CO2 24  --   --   BUN 5*  --   --   CREATININE 0.61  --   --   GLUCOSE 128*  --   --   CALCIUM 8.6*  --   --      Discharge Medications:     Medication List    TAKE these medications        acetaminophen 650 MG CR tablet  Commonly known as:  TYLENOL  Take 1,300 mg by mouth daily as needed for pain.     aspirin EC 325 MG tablet  Take 1 tablet (325 mg total) by mouth 2 (two) times daily.     CLEAR EYES MAXIMUM ITCHY EYE 0.012-0.25-0.25 % Soln  Generic drug:  Naphazoline-Glycerin-Zinc Sulf  Place 1 drop into both eyes daily as needed (for dry, red, itchy eyes).     clindamycin 1 % lotion  Commonly known as:  CLEOCIN  T  Apply 1 application topically every morning.     fluticasone 50 MCG/ACT nasal spray  Commonly known as:  FLONASE  Place 2 sprays into both nostrils daily.     ibuprofen 200 MG tablet  Commonly known as:  ADVIL,MOTRIN  Take 800 mg by mouth every 8 (eight) hours as needed for moderate pain.     methocarbamol 500 MG tablet  Commonly known as:  ROBAXIN  Take 1 tablet (500 mg total) by mouth 2 (two) times daily with a meal.     oxyCODONE-acetaminophen 5-325 MG tablet  Commonly known as:  ROXICET  Take 1 tablet by mouth every 4 (four) hours as needed.     traMADol 50 MG tablet  Commonly known as:  ULTRAM  Take 1 tablet (50 mg total) by mouth every 8 (eight) hours as needed.        Diagnostic Studies: Dg Chest 2 View  06/07/2015  CLINICAL DATA:  Preop for right total knee arthroplasty. EXAM: CHEST  2 VIEW COMPARISON:  March 08, 2015. FINDINGS: The heart size and mediastinal contours are within normal limits. Both lungs are clear. The visualized  skeletal structures are unremarkable. IMPRESSION: No active cardiopulmonary disease. Electronically Signed   By: Marijo Conception, M.D.   On: 06/07/2015 16:08    Disposition: 01-Home or Self Care        Follow-up Information    Follow up with Kerin Salen, MD In 2 weeks.   Specialty:  Orthopedic Surgery   Contact information:   [place] [id number] [phone]        Signed: Kerin Salen 06/21/2015, 8:00 AM

## 2015-06-21 NOTE — Care Management Note (Signed)
Case Management Note  Patient Details  Name: Krystal Mann MRN: [id number] Date of Birth: 25-Feb-1970  Subjective/Objective:    45 yr old female s/p right total knee arthroplasty.  Action/Plan: Patient will go to Skilled facility for shortterm rehab. Social worker is aware.  Expected Discharge Date:   06/21/15               Expected Discharge Plan:  [name]  In-House Referral:  Clinical Social Work  Discharge planning Services  CM Consult  Post Acute Care Choice:    Choice offered to:     DME Arranged:   NA DME Agency:     HH Arranged:   NA HH Agency:     Status of Service:  Completed, signed off  Medicare Important Message Given:    Date Medicare IM Given:    Medicare IM give by:    Date Additional Medicare IM Given:    Additional Medicare Important Message give by:     If discussed at [place] of Stay Meetings, dates discussed:    Additional Comments:  Ninfa Meeker, RN 06/21/2015, 11:29 AM

## 2015-06-21 NOTE — Progress Notes (Signed)
Orthopedic Tech Progress Note Patient Details:  [name] 08-06-1970 [id number]  Patient ID: Krystal Mann, female   DOB: November 08, 1970, 45 y.o.   MRN: [id number] Wants to wait till after breakfast for cpm  Karolee Stamps 06/21/2015, 7:10 AM

## 2015-06-21 NOTE — Progress Notes (Signed)
Physical Therapy Treatment Patient Details Name: Krystal Mann MRN: [id number] DOB: 11-Jan-1971 Today's Date: 06/21/2015    History of Present Illness Pt s/p RTKA.PMH: back pain, cervical discectomy, depression.     PT Comments    Pt making gradual progress with PT, will continue to progress as tolerated. Encouraging knee flexion with exercises and with mobility activities. Anticipate SNF for D/C.   Follow Up Recommendations  SNF;Supervision for mobility/OOB     Equipment Recommendations  None recommended by PT    Recommendations for Other Services       Precautions / Restrictions Precautions Precautions: Knee;Fall Restrictions Weight Bearing Restrictions: Yes RLE Weight Bearing: Weight bearing as tolerated    Mobility  Bed Mobility Overal bed mobility: Needs Assistance Bed Mobility: Sit to Supine       Sit to supine:  (assist with Rt LE provided)   General bed mobility comments: using trapeze to assist  Transfers Overall transfer level: Needs assistance Equipment used: Rolling walker (2 wheeled) Transfers: Sit to/from Stand Sit to Stand: Min guard         General transfer comment: encouraging Rt knee flexion with sitting and standing. Pt performing transfers from chair and BSC.   Ambulation/Gait Ambulation/Gait assistance: Min guard Ambulation Distance (Feet): 50 Feet Assistive device: Rolling walker (2 wheeled) Gait Pattern/deviations: Step-to pattern Gait velocity: decreased   General Gait Details: encouraging step-through pattern and Rt knee flexion with swing phase. Initially pt vaulting over Rt LE and circumducting with swing phase, improving with cues.    Stairs            Wheelchair Mobility    Modified Rankin (Stroke Patients Only)       Balance Overall balance assessment: Needs assistance Sitting-balance support: No upper extremity supported Sitting balance-Leahy Scale: Good     Standing balance support: During functional  activity Standing balance-Leahy Scale: Fair Standing balance comment: able to stand at sink to wash hands.                     Cognition Arousal/Alertness: Awake/alert Behavior During Therapy: WFL for tasks assessed/performed Overall Cognitive Status: Within Functional Limits for tasks assessed                      Exercises Total Joint Exercises Ankle Circles/Pumps: AROM;Both;10 reps Quad Sets: Strengthening;Right;10 reps Short Arc Quad: Strengthening;Right;10 reps (min assist) Heel Slides: AAROM;Right;10 reps Straight Leg Raises: Strengthening;Right;10 reps (max assist) Goniometric ROM: knee flexion 41 degrees, guarded endfeel    General Comments        Pertinent Vitals/Pain Pain Assessment: 0-10 Pain Score: 6  Pain Location: Rt knee Pain Descriptors / Indicators: Aching Pain Intervention(s): Limited activity within patient's tolerance;Monitored during session    Home Living                      Prior Function            PT Goals (current goals can now be found in the care plan section) Acute Rehab PT Goals Patient Stated Goal: get back to walking and being active PT Goal Formulation: With patient Time For Goal Achievement: 07/03/15 Potential to Achieve Goals: Good Progress towards PT goals: Progressing toward goals    Frequency  7X/week    PT Plan Current plan remains appropriate    Co-evaluation             End of Session Equipment Utilized During Treatment: Gait belt Activity Tolerance:  Patient limited by pain Patient left: in bed;with call bell/phone within reach (in knee extension)     Time: [id number] PT Time Calculation (min) (ACUTE ONLY): 33 min  Charges:  $Gait Training: 8-22 mins $Therapeutic Exercise: 8-22 mins                    G Codes:      Cassell Clement, PT, CSCS Pager [phone] Office [phone]  06/21/2015, 3:47 PM

## 2015-06-21 NOTE — Clinical Social Work Note (Signed)
Clinical Social Work Assessment  Patient Details  Name: Krystal Mann MRN: [id number] Date of Birth: 1970-04-22  Date of referral:  06/21/15               Reason for consult:  Facility Placement                Permission sought to share information with:  Chartered certified accountant granted to share information::  Yes, Verbal Permission Granted  Name::        Agency::  [facility]  Relationship::     Contact Information:     Housing/Transportation Living arrangements for the past 2 months:  Single Family Home Source of Information:  Patient Patient Interpreter Needed:  None Criminal Activity/Legal Involvement Pertinent to Current Situation/Hospitalization:  No - Comment as needed Significant Relationships:  None Lives with:  Self Do you feel safe going back to the place where you live?  No Need for family participation in patient care:  No (Coment) (Patient able to make own decisions.)  Care giving concerns:  Patient expressed no concerns at this time.   Social Worker assessment / plan:  LCSW received referral for possible SNF placement at time of discharge. LCSW met with patient at bedside to discuss discharge planning needs. Per patient, patient will be discharged to [facility]. LCSW to continue to follow and assist with discharge planning needs.  Employment status:  Other (Comment) (did not disclose) Insurance information:  Programmer, applications Nurse, mental health) PT Recommendations:  [place] / Referral to community resources:  [place]  Patient/Family's Response to care:  Patient understanding and agreeable to [facility] plan of care.  Patient/Family's Understanding of and Emotional Response to Diagnosis, Current Treatment, and Prognosis:  Patient understanding and agreeable to LCSW plan of care.  Emotional Assessment Appearance:  Appears stated age Attitude/Demeanor/Rapport:  Other (Appropriate) Affect (typically observed):   Accepting, Appropriate, Pleasant Orientation:  Oriented to Self, Oriented to Place, Oriented to  Time, Oriented to Situation Alcohol / Substance use:  Not Applicable Psych involvement (Current and /or in the community):  No (Comment) (Not appropriate at this time.)  Discharge Needs  Concerns to be addressed:  No discharge needs identified Readmission within the last 30 days:  No Current discharge risk:  None Barriers to Discharge:  No Barriers Identified   Caroline Sauger, LCSW 06/21/2015, 10:10 AM [phone]

## 2015-06-21 NOTE — Progress Notes (Signed)
06/21/15 1400  Call report to [facility]. [phone] Report given to charge RN (tanya).

## 2015-06-21 NOTE — Clinical Social Work Note (Signed)
Patient to be discharged to [facility]. Patient updated at bedside. Patient to be transported via EMS. RN report number: [place], [facility]: [phone] Surgical: [phone]

## 2015-06-21 NOTE — Clinical Social Work Placement (Signed)
   CLINICAL SOCIAL WORK PLACEMENT  NOTE  Date:  06/21/2015  Patient Details  Name: Krystal Mann MRN: [id number] Date of Birth: 1970-02-25  Clinical Social Work is seeking post-discharge placement for this patient at the [place] level of care (*CSW will initial, date and re-position this form in  chart as items are completed):  Yes   Patient/family provided with [facility] Work Department's list of facilities offering this level of care within the geographic area requested by the patient (or if unable, by the patient's family).  Yes   Patient/family informed of their freedom to choose among providers that offer the needed level of care, that participate in Medicare, Medicaid or managed care program needed by the patient, have an available bed and are willing to accept the patient.  Yes   Patient/family informed of [facility]'s ownership interest in [facility] and [facility], as well as of the fact that they are under no obligation to receive care at these facilities.  PASRR submitted to EDS on 06/19/15     PASRR number received on 06/19/15     Existing PASRR number confirmed on       FL2 transmitted to all facilities in geographic area requested by pt/family on 06/19/15     FL2 transmitted to all facilities within larger geographic area on       Patient informed that his/her managed care company has contracts with or will negotiate with certain facilities, including the following:        Yes   Patient/family informed of bed offers received.  Patient chooses bed at [facility]     Physician recommends and patient chooses bed at      Patient to be transferred to [facility] on 06/21/15.  Patient to be transferred to facility by [facility]     Patient family notified on 06/21/15 of transfer.  Name of family member notified:  Patient     PHYSICIAN Please sign FL2     Additional Comment:     _______________________________________________ Caroline Sauger, LCSW 06/21/2015, 10:08 AM

## 2015-06-21 NOTE — Progress Notes (Signed)
Patient ID: Krystal Mann, female   DOB: [birth year], 45 y.o.   MRN: [id number] PATIENT ID: Krystal Mann  MRN: [id number]  DOB/AGE:  Jan 25, 1970 / 45 y.o.  3 Days Post-Op Procedure(s) (LRB): TOTAL KNEE ARTHROPLASTY (Right)    PROGRESS NOTE Subjective: Patient is alert, oriented, no Nausea, no Vomiting, yes passing gas. Taking PO well. Denies SOB, Chest or Calf Pain. Using Incentive Spirometer, PAS in place. Ambulate 30 feet yesterday, CPM 0-60 Patient reports pain as 4/10 .    Objective: Vital signs in last 24 hours: Filed Vitals:   06/20/15 0440 06/20/15 1300 06/20/15 1900 06/21/15 0500  BP: 134/74 130/75 126/76 130/70  Pulse: 75 88 106 89  Temp: 98 F (36.7 C) 98.7 F (37.1 C) 98.8 F (37.1 C) 98 F (36.7 C)  TempSrc: Oral Oral Oral Oral  Resp: 16 16 16 16   Weight:      SpO2: 98% 98% 92% 92%      Intake/Output from previous day: I/O last 3 completed shifts: In: 240 [P.O.:240] Out: -    Intake/Output this shift:     LABORATORY DATA:  Recent Labs  06/18/15 1907  06/19/15 0430 06/20/15 0441 06/21/15 0400  WBC  --   < > 11.1* 10.0 8.5  HGB  --   < > 11.0* 11.2* 10.2*  HCT  --   < > 34.8* 35.5* 32.3*  PLT  --   < > 272 264 231  NA  --   --  135  --   --   K  --   --  4.0  --   --   CL  --   --  104  --   --   CO2  --   --  24  --   --   BUN  --   --  5*  --   --   CREATININE  --   --  0.61  --   --   GLUCOSE  --   --  128*  --   --   GLUCAP 162*  --   --   --   --   CALCIUM  --   --  8.6*  --   --   < > = values in this interval not displayed.  Examination: Neurologically intact ABD soft Neurovascular intact Sensation intact distally Intact pulses distally Dorsiflexion/Plantar flexion intact Incision: dressing C/D/I No cellulitis present Compartment soft}  Assessment:   3 Days Post-Op Procedure(s) (LRB): TOTAL KNEE ARTHROPLASTY (Right) ADDITIONAL DIAGNOSIS: Expected Acute Blood Loss Anemia, obesity  Plan: PT/OT WBAT, CPM 5/hrs day until ROM  0-90 degrees, then D/C CPM DVT Prophylaxis:  SCDx72hrs, ASA 325 mg BID x 2 weeks DISCHARGE PLAN: Skilled Nursing Facility/Rehab, [facility] today DISCHARGE NEEDS: HHPT, CPM, Walker and 3-in-1 comode seat     Krystal Mann J 06/21/2015, 7:56 AM

## 2015-06-22 ENCOUNTER — Encounter: Payer: Self-pay | Admitting: Internal Medicine

## 2015-06-22 ENCOUNTER — Other Ambulatory Visit: Payer: Self-pay

## 2015-06-22 ENCOUNTER — Non-Acute Institutional Stay (SKILLED_NURSING_FACILITY): Payer: BC Managed Care – PPO | Admitting: Internal Medicine

## 2015-06-22 DIAGNOSIS — D62 Acute posthemorrhagic anemia: Secondary | ICD-10-CM | POA: Diagnosis not present

## 2015-06-22 DIAGNOSIS — M1711 Unilateral primary osteoarthritis, right knee: Secondary | ICD-10-CM

## 2015-06-22 DIAGNOSIS — K219 Gastro-esophageal reflux disease without esophagitis: Secondary | ICD-10-CM

## 2015-06-22 DIAGNOSIS — K5901 Slow transit constipation: Secondary | ICD-10-CM | POA: Diagnosis not present

## 2015-06-22 DIAGNOSIS — R2681 Unsteadiness on feet: Secondary | ICD-10-CM

## 2015-06-22 DIAGNOSIS — S83206A Unspecified tear of unspecified meniscus, current injury, right knee, initial encounter: Secondary | ICD-10-CM

## 2015-06-22 DIAGNOSIS — J3089 Other allergic rhinitis: Secondary | ICD-10-CM | POA: Diagnosis not present

## 2015-06-22 MED ORDER — OXYCODONE-ACETAMINOPHEN 5-325 MG PO TABS: 1.0000 | ORAL_TABLET | ORAL | Status: DC | PRN

## 2015-06-22 MED ORDER — TRAMADOL HCL 50 MG PO TABS: 50.0000 mg | ORAL_TABLET | Freq: Three times a day (TID) | ORAL | Status: DC | PRN

## 2015-06-22 NOTE — Telephone Encounter (Signed)
Rx faxed to [facility] @ [phone], phone number [phone]  

## 2015-06-22 NOTE — Telephone Encounter (Signed)
RX Voided, patient is not under the care of Dr.Pandey or Dinah at [facility]. Message written on fax and sent back to [facility]

## 2015-06-22 NOTE — Progress Notes (Signed)
LOCATION: [facility]  PCP: Sheral Flow, NP   Code Status: Full Code  Goals of care: Advanced Directive information Advanced Directives 06/20/2015  Does patient have an advance directive? No  Would patient like information on creating an advanced directive? No - patient declined information       Extended Emergency Contact Information Primary Emergency Contact: Pooley,Nettie Address: [place], [place] [address] Johnnette Litter of [place] Phone: [phone] Mobile Phone: [phone] Relation: Mother Secondary Emergency Contact: Neoma Laming, [place] of [place] Mobile Phone: [phone] Relation: Sister   Allergies  Allergen Reactions  . Hydroxyprogesterone Caproate Other (See Comments)    Severe headache  . Morphine And Related Itching and Nausea And Vomiting  . Hydrocodone Itching    Chief Complaint  Patient presents with  . New Admit To SNF    New Admission     HPI:  Patient is a 45 y.o. female seen today for short term rehabilitation post hospital admission from 06/18/15-06/21/15 with right knee OA. She underwent right total knee arthroplasty. She complaints of pain to her right knee. She also complaints of heartburn and indigestion. She has been constipated.   Review of Systems:  Constitutional: Negative for fever, chills, diaphoresis. Energy level is slowly returning.  HENT: Negative for headache, congestion, nasal discharge, hearing loss, sore throat, difficulty swallowing.   Eyes: Negative for blurred vision, double vision and discharge.  Respiratory: Negative for cough, shortness of breath and wheezing.   Cardiovascular: Negative for chest pain, palpitations, leg swelling.  Gastrointestinal: Negative for nausea, vomiting, abdominal pain. Positive for heartburn. Has been constipated. Genitourinary: Negative for dysuria and flank pain.  Musculoskeletal: Negative for back pain,  fall in the facility.  Skin: Negative for itching, rash.  Neurological: Negative for dizziness. Psychiatric/Behavioral: Negative for depression.   Past Medical History  Diagnosis Date  . Anemia   . Depression 2004  . Arthritis     knee  . Back pain   . Rotator cuff (capsule) sprain     right  . Chicken pox   . Diverticulitis   . Migraines     no longer has them. Was medication related  . Shortness of breath dyspnea     occasionally  . Seasonal allergies    Past Surgical History  Procedure Laterality Date  . Cervical discectomy  2012  . Tubal ligation    . Total knee arthroplasty Right 06/18/2015    Procedure: TOTAL KNEE ARTHROPLASTY;  Surgeon: Frederik Pear, MD;  Location: [facility];  Service: Orthopedics;  Laterality: Right;   Social History:   reports that she has never smoked. She has never used smokeless tobacco. She reports that she does not drink alcohol or use illicit drugs.  Family History  Problem Relation Age of Onset  . Kidney cancer Mother     malaginant cancer (kidney), skin cancer  . Diabetes Mother   . Hyperlipidemia Mother   . Lung cancer Father   . Diabetes Sister   . Asthma Sister   . Skin cancer Mother   . Colon polyps Brother 41    x 21 removed  . Liver cancer Brother 67  . Colon polyps Brother     Medications:   Medication List       This list is accurate as of: 06/22/15  2:15 PM.  Always use your most recent med list.  acetaminophen 650 MG CR tablet  Commonly known as:  TYLENOL  Take 1,300 mg by mouth daily as needed for pain.     aspirin EC 325 MG tablet  Take 1 tablet (325 mg total) by mouth 2 (two) times daily.     CLEAR EYES MAXIMUM ITCHY EYE 0.012-0.25-0.25 % Soln  Generic drug:  Naphazoline-Glycerin-Zinc Sulf  Place 1 drop into both eyes daily as needed (for dry, red, itchy eyes).     clindamycin 1 % lotion  Commonly known as:  CLEOCIN T  Apply 1 application topically every morning.     fluticasone 50 MCG/ACT  nasal spray  Commonly known as:  FLONASE  Place 2 sprays into both nostrils daily.     methocarbamol 500 MG tablet  Commonly known as:  ROBAXIN  Take 1 tablet (500 mg total) by mouth 2 (two) times daily with a meal.     oxyCODONE-acetaminophen 5-325 MG tablet  Commonly known as:  ROXICET  Take 1 tablet by mouth every 4 (four) hours as needed.     traMADol 50 MG tablet  Commonly known as:  ULTRAM  Take 1 tablet (50 mg total) by mouth every 8 (eight) hours as needed.        Immunizations: Immunization History  Administered Date(s) Administered  . Influenza Split 12/19/2010  . PPD Test 06/21/2015     Physical Exam:  Filed Vitals:   06/22/15 1412  BP: 114/76  Pulse: 89  Temp: 99.1 F (37.3 C)  TempSrc: Oral  Resp: 20  Weight: 228 lb (103.42 kg)  SpO2: 98%   Body mass index is 35.16 kg/(m^2).  General- adult female, obese, in no acute distress Head- normocephalic, atraumatic Nose- no maxillary or frontal sinus tenderness, no nasal discharge Throat- moist mucus membrane  Eyes- PERRLA, EOMI, no pallor, no icterus, no discharge, normal conjunctiva, normal sclera Neck- no cervical lymphadenopathy Cardiovascular- normal s1,s2, no murmur, trace leg edema Respiratory- bilateral clear to auscultation, no wheeze, no rhonchi, no crackles, no use of accessory muscles Abdomen- bowel sounds present, soft, non tender Musculoskeletal- able to move all 4 extremities, limited right knee range of motion, has CPM machine in place Neurological- alert and oriented to person, place and time Skin- warm and dry, aquacel dressing to her right knee Psychiatry- normal mood and affect    Labs reviewed: Basic Metabolic Panel:  Recent Labs  03/27/15 2148 06/07/15 1301 06/19/15 0430  NA 142 137 135  K 3.8 3.6 4.0  CL 106 100* 104  CO2 28 28 24   GLUCOSE 113* 75 128*  BUN 8 7 5*  CREATININE 0.70 0.80 0.61  CALCIUM 9.3 9.3 8.6*   Liver Function Tests:  Recent Labs   01/01/15 1219  AST 13  ALT 8  ALKPHOS 93  BILITOT 0.7  PROT 7.4  ALBUMIN 3.9   No results for input(s): LIPASE, AMYLASE in the last 8760 hours. No results for input(s): AMMONIA in the last 8760 hours. CBC:  Recent Labs  03/27/15 2148 06/07/15 1301 06/19/15 0430 06/20/15 0441 06/21/15 0400  WBC 5.3 5.5 11.1* 10.0 8.5  NEUTROABS 3.0 3.4  --   --   --   HGB 11.7* 12.3 11.0* 11.2* 10.2*  HCT 35.0* 38.2 34.8* 35.5* 32.3*  MCV 84.5 86.2 86.4 87.7 88.5  PLT 293 323 272 264 231   Cardiac Enzymes: No results for input(s): CKTOTAL, CKMB, CKMBINDEX, TROPONINI in the last 8760 hours. BNP: Invalid input(s): POCBNP CBG:  Recent Labs  06/18/15 1907  GLUCAP 162*    Radiological Exams: Dg Chest 2 View  06/07/2015  CLINICAL DATA:  Preop for right total knee arthroplasty. EXAM: CHEST  2 VIEW COMPARISON:  March 08, 2015. FINDINGS: The heart size and mediastinal contours are within normal limits. Both lungs are clear. The visualized skeletal structures are unremarkable. IMPRESSION: No active cardiopulmonary disease. Electronically Signed   By: Marijo Conception, M.D.   On: 06/07/2015 16:08    Assessment/Plan  Unsteady gait Post knee surgery, Will have patient work with PT/OT as tolerated to regain strength and restore function.  Fall precautions are in place.  Right knee OA S/p right total knee arthroplasty. Has orthopedics follow up. Get physiatry consult for pain management. Will have her work with physical therapy and occupational therapy team to help with gait training and muscle strengthening exercises.fall precautions. Skin care. Encourage to be out of bed. Continue tramadol but change to 50 mg q6h prn from q8h prn for pain. Continue oxycodone apap 5-325 mg q4h prn pain. Continue robaxin 500 mg bid for muscle spasm. Continue aspirin 325 mg bid for dvt prophylaxis.   Constipation Add colace 100 mg bid and miralax for now and monitor, hydration encouraged  gerd Start  ranitidine 150 mg daily x 5 days, then daily as needed, advised to sit upright post meals and monitor  Blood loss anemia Post op, monitor cbc  Allergic rhinitis Continue her flonase and monitor    Goals of care: short term rehabilitation   Labs/tests ordered: cbc, cmp  Family/ staff Communication: reviewed care plan with patient and nursing supervisor    Blanchie Serve, MD Internal Medicine [place], [place] [address] Cell Phone (Monday-Friday 8 am - 5 pm): [phone] On Call: [phone] and follow prompts after 5 pm and on weekends Office Phone: [phone] Office Fax: [phone]

## 2015-06-22 NOTE — Telephone Encounter (Addendum)
Rx faxed to [facility] @ [phone], phone number [phone]  

## 2015-06-25 ENCOUNTER — Other Ambulatory Visit: Payer: Self-pay | Admitting: *Deleted

## 2015-06-25 DIAGNOSIS — S83206A Unspecified tear of unspecified meniscus, current injury, right knee, initial encounter: Secondary | ICD-10-CM

## 2015-06-25 LAB — CBC AND DIFFERENTIAL
HCT: 33 % — AB (ref 36–46)
Hemoglobin: 10.3 g/dL — AB (ref 12.0–16.0)
Neutrophils Absolute: 3 /uL
PLATELETS: 328 10*3/uL (ref 150–399)
WBC: 5.9 10*3/mL

## 2015-06-25 LAB — HEPATIC FUNCTION PANEL
ALT: 20 U/L (ref 7–35)
AST: 21 U/L (ref 13–35)
Alkaline Phosphatase: 102 U/L (ref 25–125)
Bilirubin, Total: 0.7 mg/dL

## 2015-06-25 LAB — BASIC METABOLIC PANEL
BUN: 11 mg/dL (ref 4–21)
CREATININE: 0.7 mg/dL (ref 0.5–1.1)
Glucose: 111 mg/dL
Potassium: 4.3 mmol/L (ref 3.4–5.3)
Sodium: 140 mmol/L (ref 137–147)

## 2015-06-25 MED ORDER — TRAMADOL HCL 50 MG PO TABS: ORAL_TABLET | ORAL | Status: DC

## 2015-06-25 NOTE — Telephone Encounter (Signed)
[facility] 

## 2015-06-28 ENCOUNTER — Other Ambulatory Visit: Payer: Self-pay | Admitting: *Deleted

## 2015-06-28 DIAGNOSIS — S83206A Unspecified tear of unspecified meniscus, current injury, right knee, initial encounter: Secondary | ICD-10-CM

## 2015-06-28 MED ORDER — TRAMADOL HCL 50 MG PO TABS: ORAL_TABLET | ORAL | Status: DC

## 2015-06-28 NOTE — Telephone Encounter (Signed)
[facility] 

## 2015-06-29 ENCOUNTER — Non-Acute Institutional Stay (SKILLED_NURSING_FACILITY): Payer: BC Managed Care – PPO | Admitting: Adult Health

## 2015-06-29 ENCOUNTER — Encounter: Payer: Self-pay | Admitting: Adult Health

## 2015-06-29 DIAGNOSIS — J3089 Other allergic rhinitis: Secondary | ICD-10-CM | POA: Diagnosis not present

## 2015-06-29 DIAGNOSIS — D62 Acute posthemorrhagic anemia: Secondary | ICD-10-CM

## 2015-06-29 DIAGNOSIS — K5901 Slow transit constipation: Secondary | ICD-10-CM

## 2015-06-29 DIAGNOSIS — K219 Gastro-esophageal reflux disease without esophagitis: Secondary | ICD-10-CM | POA: Diagnosis not present

## 2015-06-29 DIAGNOSIS — R2681 Unsteadiness on feet: Secondary | ICD-10-CM

## 2015-06-29 DIAGNOSIS — M1711 Unilateral primary osteoarthritis, right knee: Secondary | ICD-10-CM | POA: Diagnosis not present

## 2015-06-29 NOTE — Progress Notes (Signed)
Patient ID: Krystal Mann, female   DOB: September 18, 1970, 45 y.o.   MRN: [id number]    DATE:    06/29/15   MRN:  [id number]  BIRTHDAY: [birth year]  Facility:  [facility] Room Number: [id number]  LEVEL OF CARE:  SNF [phone])  Contact Information    Name Relation Home Work [place] Mother [phone]  [phone]   Clinton,Antionette Sister   [phone]       Code Status History    This patient does not have a recorded code status. Please follow your organizational policy for patients in this situation.       Chief Complaint  Patient presents with  . Discharge Note    HISTORY OF PRESENT ILLNESS:  This is a 45 year old female who is for discharge home with home health PT for endurance, OT for ADLs and CNA for showers. DME:  Standard rolling walker  She has been admitted to [facility] on [facility]15 from [facility] with right knee osteoarthritis for which she had right total knee arthroplasty on 06/18/15.  Patient was admitted to this facility for short-term rehabilitation after the patient's recent hospitalization.  Patient has completed SNF rehabilitation and therapy has cleared the patient for discharge.  PAST MEDICAL HISTORY:  Past Medical History  Diagnosis Date  . Anemia   . Depression 2004  . Arthritis     knee  . Back pain   . Rotator cuff (capsule) sprain     right  . Chicken pox   . Diverticulitis   . Migraines     no longer has them. Was medication related  . Shortness of breath dyspnea     occasionally  . Seasonal allergies      CURRENT MEDICATIONS: Reviewed  Patient's Medications  New Prescriptions   No medications on file  Previous Medications   ACETAMINOPHEN (TYLENOL) 325 MG TABLET    Take 650 mg by mouth. 6A, 12N, 6P, 12MN   ASPIRIN EC 325 MG TABLET    Take 1 tablet (325 mg total) by mouth 2 (two) times daily.   CLINDAMYCIN (CLEOCIN T) 1 % LOTION    Apply 1 application  topically every morning.    DOCUSATE SODIUM (COLACE) 100 MG CAPSULE    Take 100 mg by mouth 2 (two) times daily.   FLUTICASONE (FLONASE) 50 MCG/ACT NASAL SPRAY    Place 2 sprays into both nostrils daily.   MAGNESIUM HYDROXIDE (MILK OF MAGNESIA PO)    Take 30 mLs by mouth daily as needed.   METHOCARBAMOL (ROBAXIN) 500 MG TABLET    Take 500 mg by mouth 3 (three) times daily. 6A, 2P, 10P   NAPHAZOLINE-GLYCERIN-ZINC SULF (CLEAR EYES MAXIMUM ITCHY EYE) 0.012-0.25-0.25 % SOLN    Place 1 drop into both eyes daily as needed (for dry, red, itchy eyes).   OXYCODONE-ACETAMINOPHEN (ROXICET) 5-325 MG TABLET    Take 1 tablet by mouth every 4 (four) hours as needed.   RANITIDINE (ZANTAC) 150 MG TABLET    Take 150 mg by mouth daily as needed for heartburn.   TRAMADOL (ULTRAM) 50 MG TABLET    Take 50 mg by mouth every 6 (six) hours as needed. 6A, 12N, 6P, 12MN  Modified Medications   No medications on file  Discontinued Medications   ACETAMINOPHEN (TYLENOL) 650 MG CR TABLET    Take 1,300 mg by mouth daily as needed for pain.    METHOCARBAMOL (ROBAXIN) 500 MG  TABLET    Take 1 tablet (500 mg total) by mouth 2 (two) times daily with a meal.   TRAMADOL (ULTRAM) 50 MG TABLET    Take one tablet by mouth every 8 hours as needed for pain     Allergies  Allergen Reactions  . Hydroxyprogesterone Caproate Other (See Comments)    Severe headache  . Morphine And Related Itching and Nausea And Vomiting  . Hydrocodone Itching     REVIEW OF SYSTEMS:  GENERAL: no change in appetite, no fatigue, no weight changes, no fever, chills or weakness EYES: Denies change in vision, dry eyes, eye pain, itching or discharge EARS: Denies change in hearing, ringing in ears, or earache NOSE: Denies nasal congestion or epistaxis MOUTH and THROAT: Denies oral discomfort, gingival pain or bleeding, pain from teeth or hoarseness   RESPIRATORY: no cough, SOB, DOE, wheezing, hemoptysis CARDIAC: no chest pain, edema or palpitations GI:  no abdominal pain, diarrhea, constipation, heart burn, nausea or vomiting GU: Denies dysuria, frequency, hematuria, incontinence, or discharge PSYCHIATRIC: Denies feeling of depression or anxiety. No report of hallucinations, insomnia, paranoia, or agitation     PHYSICAL EXAMINATION  GENERAL APPEARANCE: Well nourished. In no acute distress. Obese SKIN:  Right knee surgical incision is covered with Aquacel dressing, dry, no erythema HEAD: Normal in size and contour. No evidence of trauma EYES: Lids open and close normally. No blepharitis, entropion or ectropion. PERRL. Conjunctivae are clear and sclerae are white. Lenses are without opacity EARS: Pinnae are normal. Patient hears normal voice tunes of the examiner MOUTH and THROAT: Lips are without lesions. Oral mucosa is moist and without lesions. Tongue is normal in shape, size, and color and without lesions NECK: supple, trachea midline, no neck masses, no thyroid tenderness, no thyromegaly LYMPHATICS: no LAN in the neck, no supraclavicular LAN RESPIRATORY: breathing is even & unlabored, BS CTAB CARDIAC: RRR, no murmur,no extra heart sounds, no edema GI: abdomen soft, normal BS, no masses, no tenderness, no hepatomegaly, no splenomegaly EXTREMITIES:  Able to move 4 extremities PSYCHIATRIC: Alert and oriented X 3. Affect and behavior are appropriate  LABS/RADIOLOGY: Labs reviewed: Basic Metabolic Panel:  Recent Labs  03/27/15 2148 06/07/15 1301 06/19/15 0430 06/25/15  NA 142 137 135 140  K 3.8 3.6 4.0 4.3  CL 106 100* 104  --   CO2 28 28 24   --   GLUCOSE 113* 75 128*  --   BUN 8 7 5* 11  CREATININE 0.70 0.80 0.61 0.7  CALCIUM 9.3 9.3 8.6*  --    Liver Function Tests:  Recent Labs  01/01/15 1219 06/25/15  AST 13 21  ALT 8 20  ALKPHOS 93 102  BILITOT 0.7  --   PROT 7.4  --   ALBUMIN 3.9  --    No results for input(s): LIPASE, AMYLASE in the last 8760 hours. No results for input(s): AMMONIA in the last 8760  hours. CBC:  Recent Labs  03/27/15 2148 06/07/15 1301 06/19/15 0430 06/20/15 0441 06/21/15 0400 06/25/15  WBC 5.3 5.5 11.1* 10.0 8.5 5.9  NEUTROABS 3.0 3.4  --   --   --  3  HGB 11.7* 12.3 11.0* 11.2* 10.2* 10.3*  HCT 35.0* 38.2 34.8* 35.5* 32.3* 33*  MCV 84.5 86.2 86.4 87.7 88.5  --   PLT 293 323 272 264 231 328   A1C: Invalid input(s): A1C Lipid Panel:  Recent Labs  01/01/15 1219  HDL 51.00    CBG:  Recent Labs  06/18/15 1907  GLUCAP 162*      Dg Chest 2 View  06/07/2015  CLINICAL DATA:  Preop for right total knee arthroplasty. EXAM: CHEST  2 VIEW COMPARISON:  March 08, 2015. FINDINGS: The heart size and mediastinal contours are within normal limits. Both lungs are clear. The visualized skeletal structures are unremarkable. IMPRESSION: No active cardiopulmonary disease. Electronically Signed   By: Marijo Conception, M.D.   On: 06/07/2015 16:08    ASSESSMENT/PLAN:  Unsteady gait - for home health PT, OT and CNA  Right knee osteoarthritis S/P right total knee arthroplasty - for home health PT, OT and CNA; change tramadol 50 mg 1-2 tabs by mouth Q 6 hours when necessary  for pain upon discharge and continue Percocet 5/325 mg 1 tab by mouth every 4 hours when necessary and Tylenol 325 mg 1 tab by mouth 4X/day for pain; change Robaxin 500 mg 1 tab by mouth every 6 hours when necessary for muscle spasm upon discharge;   GERD - continue ranitidine 150 mg 1 tab by mouth daily when necessary  Allergic rhinitis - continue Flonase 50 g placed 2 sprays into both nostrils daily  Constipation - continue Colace 100 mg by mouth twice a day and MiraLAX 17 g by mouth daily  Anemia, acute blood loss - hemoglobin 10.3; stable     I have filled out patient's discharge paperwork and written prescriptions.  Patient will receive home health PT, OT and CNA.  DME provided:  Standard rolling walker  Total discharge time: Greater than 30 minutes  Discharge time involved  coordination of the discharge process with social worker, nursing staff and therapy department. Medical justification for home health services/DME verified.    Durenda Age, NP [employer] [phone]

## 2015-07-04 ENCOUNTER — Ambulatory Visit: Payer: BC Managed Care – PPO | Admitting: Internal Medicine

## 2015-07-04 DIAGNOSIS — Z0289 Encounter for other administrative examinations: Secondary | ICD-10-CM

## 2015-07-06 ENCOUNTER — Ambulatory Visit (INDEPENDENT_AMBULATORY_CARE_PROVIDER_SITE_OTHER): Payer: BC Managed Care – PPO | Admitting: Primary Care

## 2015-07-06 ENCOUNTER — Encounter: Payer: Self-pay | Admitting: Primary Care

## 2015-07-06 VITALS — BP 118/78 | HR 85 | Temp 98.0°F | Ht 67.0 in | Wt 237.4 lb

## 2015-07-06 DIAGNOSIS — M1711 Unilateral primary osteoarthritis, right knee: Secondary | ICD-10-CM

## 2015-07-06 NOTE — Patient Instructions (Addendum)
Discuss pain medication with your orthopedist during your next follow up appointment.  You are due for your annual physical in December 2017, we will see you then or sooner if needed.  It was a pleasure to see you today!

## 2015-07-06 NOTE — Progress Notes (Signed)
Pre visit review using our clinic review tool, if applicable. No additional management support is needed unless otherwise documented below in the visit note. 

## 2015-07-06 NOTE — Progress Notes (Signed)
Subjective:    Patient ID: Krystal Mann, female    DOB: 09-Jun-1970, 45 y.o.   MRN: [id number]  HPI  Krystal Mann is a 45 year old female who presents today to discuss medications.  1) Total Knee Arthroplasty: Completed on 06/18/15. Completed rehabilitation and has had recent re-evaluation and follow up with her orthopedist. She has follow up scheduled on July 7th again with her orthopedist. She is currently taking Tramadol and Tylenol twice daily, percocet every 6 hours on average, and Robaxin twice daily. She has noted constipation with medication use and will take laxatives/stool softeners PRN.   She completes home PT three days weekly. She was told by the nursing rehabilitation center that she needed to follow up with her PCP in regards to medication management. Overall she's feeling improved but does have moderate discomfort to right knee.  2) GERD: Improved overall. Currently does not take H2 Blocker or PPI. Denies esophageal burning, throat fullness, vomiting.  Review of Systems  Constitutional: Negative for fever.  Musculoskeletal: Positive for joint swelling and arthralgias.  Neurological: Negative for weakness.       Past Medical History  Diagnosis Date  . Anemia   . Depression 2004  . Arthritis     knee  . Back pain   . Rotator cuff (capsule) sprain     right  . Chicken pox   . Diverticulitis   . Migraines     no longer has them. Was medication related  . Shortness of breath dyspnea     occasionally  . Seasonal allergies      Social History   Social History  . Marital Status: Divorced    Spouse Name: N/A  . Number of Children: 2  . Years of Education: N/A   Occupational History  . 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 Smoking status: Never Smoker   . Smokeless tobacco: Never Used  . Alcohol Use: No  . Drug Use: No  . Sexual Activity:    Partners: Male    Birth Control/ Protection: Surgical       Comment: tubial ligation   Other Topics Concern  . Not on file   Social History Narrative   Single.   2 Children, 1 grandson.   Works as a Teacher, early years/pre.   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    Past Surgical History  Procedure Laterality Date  . Cervical discectomy  2012  . Tubal ligation    . Total knee arthroplasty Right 06/18/2015    Procedure: TOTAL KNEE ARTHROPLASTY;  Surgeon: Frederik Pear, MD;  Location: [facility];  Service: Orthopedics;  Laterality: Right;    Family History  Problem Relation Age of Onset  . Kidney cancer Mother     malaginant cancer (kidney), skin cancer  . Diabetes Mother   . Hyperlipidemia Mother   . Lung cancer Father   . Diabetes Sister   . Asthma Sister   . Skin cancer Mother   . Colon polyps Brother 41    x 21 removed  . Liver cancer Brother 67  . Colon polyps Brother     Allergies  Allergen Reactions  . Hydroxyprogesterone Caproate Other (See Comments)    Severe headache  . Morphine And Related Itching and Nausea And Vomiting  . Hydrocodone Itching    Current Outpatient Prescriptions on File Prior to Visit  Medication Sig Dispense  Refill  . acetaminophen (TYLENOL) 325 MG tablet Take 650 mg by mouth. 6A, 12N, 6P, 12MN    . aspirin EC 325 MG tablet Take 1 tablet (325 mg total) by mouth 2 (two) times daily. 30 tablet 0  . clindamycin (CLEOCIN T) 1 % lotion Apply 1 application topically every morning.     . docusate sodium (COLACE) 100 MG capsule Take 100 mg by mouth 2 (two) times daily.    . fluticasone (FLONASE) 50 MCG/ACT nasal spray Place 2 sprays into both nostrils daily. 16 g 0  . Magnesium Hydroxide (MILK OF MAGNESIA PO) Take 30 mLs by mouth daily as needed.    . methocarbamol (ROBAXIN) 500 MG tablet Take 500 mg by mouth 3 (three) times daily. 6A, 2P, 10P    . Naphazoline-Glycerin-Zinc Sulf (CLEAR EYES MAXIMUM ITCHY EYE) 0.012-0.25-0.25 % SOLN Place 1 drop into both eyes daily as needed (for dry,  red, itchy eyes).    Marland Kitchen oxyCODONE-acetaminophen (ROXICET) 5-325 MG tablet Take 1 tablet by mouth every 4 (four) hours as needed. 180 tablet 0  . ranitidine (ZANTAC) 150 MG tablet Take 150 mg by mouth daily as needed for heartburn.    . traMADol (ULTRAM) 50 MG tablet Take 50 mg by mouth every 6 (six) hours as needed. 6A, 12N, 6P, 12MN     No current facility-administered medications on file prior to visit.    BP 118/78 mmHg  Pulse 85  Temp(Src) 98 F (36.7 C) (Oral)  Ht 5\' 7"  (1.702 m)  Wt 237 lb 6.4 oz (107.684 kg)  BMI 37.17 kg/m2  SpO2 98%  LMP 06/21/2015    Objective:   Physical Exam  Constitutional: She appears well-nourished.  Cardiovascular: Normal rate and regular rhythm.   Pulmonary/Chest: Effort normal and breath sounds normal.  Musculoskeletal:  Mild swelling to right knee, incision site appears well healed.  Skin: Skin is warm and dry. No erythema.          Assessment & Plan:

## 2015-07-06 NOTE — Assessment & Plan Note (Signed)
Completed total knee arthroscopy in early June. Currently following with orthopedics and recently released from rehabilitation. Also completing physical therapy at home 3 times weekly. Managed on Percocet, tramadol, Robaxin. Discussed for her to manage this with her orthopedist at this time as she is following up regularly.  Incision site looks great, she is doing much better overall.

## 2015-07-31 ENCOUNTER — Encounter (HOSPITAL_COMMUNITY): Payer: Self-pay | Admitting: *Deleted

## 2015-08-13 ENCOUNTER — Encounter: Payer: Self-pay | Admitting: Family Medicine

## 2015-08-13 ENCOUNTER — Ambulatory Visit (INDEPENDENT_AMBULATORY_CARE_PROVIDER_SITE_OTHER)
Admission: RE | Admit: 2015-08-13 | Discharge: 2015-08-13 | Disposition: A | Discharge status: Home / Self Care | Payer: BC Managed Care – PPO | Source: Ambulatory Visit | Attending: Family Medicine | Admitting: Family Medicine

## 2015-08-13 ENCOUNTER — Ambulatory Visit (INDEPENDENT_AMBULATORY_CARE_PROVIDER_SITE_OTHER): Payer: BC Managed Care – PPO | Admitting: Family Medicine

## 2015-08-13 VITALS — BP 118/76 | HR 94 | Temp 99.0°F | Ht 67.0 in | Wt 229.5 lb

## 2015-08-13 DIAGNOSIS — R1084 Generalized abdominal pain: Secondary | ICD-10-CM

## 2015-08-13 DIAGNOSIS — R110 Nausea: Secondary | ICD-10-CM | POA: Diagnosis not present

## 2015-08-13 LAB — COMPREHENSIVE METABOLIC PANEL
ALBUMIN: 3.9 g/dL (ref 3.5–5.2)
ALK PHOS: 97 U/L (ref 39–117)
ALT: 6 U/L (ref 0–35)
AST: 11 U/L (ref 0–37)
BUN: 6 mg/dL (ref 6–23)
CHLORIDE: 103 meq/L (ref 96–112)
CO2: 29 mEq/L (ref 19–32)
Calcium: 9.2 mg/dL (ref 8.4–10.5)
Creatinine, Ser: 0.6 mg/dL (ref 0.40–1.20)
GFR: 137.86 mL/min (ref >60.00)
Glucose, Bld: 93 mg/dL (ref 70–99)
POTASSIUM: 3.3 meq/L — AB (ref 3.5–5.1)
SODIUM: 138 meq/L (ref 135–145)
TOTAL PROTEIN: 7.5 g/dL (ref 6.0–8.3)
Total Bilirubin: 0.8 mg/dL (ref 0.2–1.2)

## 2015-08-13 LAB — CBC WITH DIFFERENTIAL/PLATELET
BASOS ABS: 0 10*3/uL (ref 0.0–0.1)
BASOS PCT: 0.4 % (ref 0.0–3.0)
EOS ABS: 0 10*3/uL (ref 0.0–0.7)
Eosinophils Relative: 0.7 % (ref 0.0–5.0)
HCT: 35.9 % — ABNORMAL LOW (ref 36.0–46.0)
HEMOGLOBIN: 11.8 g/dL — AB (ref 12.0–15.0)
Lymphocytes Relative: 18.5 % (ref 12.0–46.0)
Lymphs Abs: 1.3 10*3/uL (ref 0.7–4.0)
MCHC: 32.8 g/dL (ref 30.0–36.0)
MCV: 85.8 fl (ref 78.0–100.0)
MONO ABS: 0.6 10*3/uL (ref 0.1–1.0)
Monocytes Relative: 9 % (ref 3.0–12.0)
NEUTROS ABS: 5.1 10*3/uL (ref 1.4–7.7)
Neutrophils Relative %: 71.4 % (ref 43.0–77.0)
PLATELETS: 322 10*3/uL (ref 150.0–400.0)
RBC: 4.19 Mil/uL (ref 3.87–5.11)
RDW: 14.8 % (ref 11.5–15.5)
WBC: 7.2 10*3/uL (ref 4.0–10.5)

## 2015-08-13 LAB — POCT URINE PREGNANCY: PREG TEST UR: NEGATIVE

## 2015-08-13 MED ORDER — METRONIDAZOLE 500 MG PO TABS: 500.0000 mg | ORAL_TABLET | Freq: Three times a day (TID) | ORAL | 0 refills | Status: DC

## 2015-08-13 MED ORDER — CIPROFLOXACIN HCL 500 MG PO TABS: 500.0000 mg | ORAL_TABLET | Freq: Two times a day (BID) | ORAL | 0 refills | Status: DC

## 2015-08-13 NOTE — Progress Notes (Signed)
Subjective:    Patient ID: Krystal Mann, female    DOB: February 11, 1970, 45 y.o.   MRN: [id number]  HPI  Krystal Mann is a 45 year old female who presents today with abdominal cramping in her lower left quadrant since yesterday. She describes the pain has cramping which can be severe at times causing her to "sweat".   Associated symptoms of nausea without vomiting and chills have been present. She reports a recent knee surgery in June and she is  taking opioid medications which has caused constipation that has been treated with laxatives. Last BM yesterday and she reports soft, formed stool.  Currently, she is taking Tramadol BID and Roxicet use TID for recent total knee replacement however she is not taking this today due to nausea. No other treatments have been tried at home. LMP was July 9th and she is expecting her next cycle in 4 days. She has a history of a tubal ligation.  No aggravating symptoms noted, however she reports switching to gatorade due to nausea. She denies nausea with gatorade. No alleviating factors noted and she states that the discomfort has consistently remained.   Review of Systems  Constitutional: Positive for chills. Negative for fatigue and fever.  Respiratory: Negative for cough and shortness of breath.   Cardiovascular: Negative for chest pain, palpitations and leg swelling.  Gastrointestinal: Positive for abdominal pain, constipation and nausea. Negative for vomiting.  Genitourinary: Negative for dysuria, frequency, hematuria, pelvic pain and urgency.  Musculoskeletal:       Right knee replacement  Skin: Negative for rash.  Neurological: Negative for dizziness, weakness, light-headedness, numbness and headaches.   Past Medical History:  Diagnosis Date  . Anemia   . Arthritis    knee  . Back pain   . Chicken pox   . Depression 2004  . Diverticulitis   . Migraines    no longer has them. Was medication related  . Rotator cuff (capsule) sprain    right  .  Seasonal allergies   . Shortness of breath dyspnea    occasionally     Social History   Social History  . Marital status: Divorced    Spouse name: N/A  . Number of children: 2  . Years of education: N/A   Occupational History  . 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 Smoking status: Never Smoker  . Smokeless tobacco: Never Used  . Alcohol use No  . Drug use: No  . Sexual activity: Yes    Partners: Male    Birth control/ protection: Surgical     Comment: tubial ligation   Other Topics Concern  . Not on file   Social History Narrative   Single.   2 Children, 1 grandson.   Works as a Teacher, early years/pre.   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    Past Surgical History:  Procedure Laterality Date  . CERVICAL DISCECTOMY  2012  . TOTAL KNEE ARTHROPLASTY Right 06/18/2015   Procedure: TOTAL KNEE ARTHROPLASTY;  Surgeon: Frederik Pear, MD;  Location: [facility];  Service: Orthopedics;  Laterality: Right;  . TUBAL LIGATION      Family History  Problem Relation Age of Onset  . Kidney cancer Mother     malaginant cancer (kidney), skin cancer  . Diabetes Mother   . Hyperlipidemia Mother   . Lung cancer Father   . Diabetes Sister   .  Asthma Sister   . Skin cancer Mother   . Colon polyps Brother 41    x 21 removed  . Liver cancer Brother 70  . Colon polyps Brother     Allergies  Allergen Reactions  . Hydroxyprogesterone Caproate Other (See Comments)    Severe headache  . Morphine And Related Itching and Nausea And Vomiting  . Hydrocodone Itching    Current Outpatient Prescriptions on File Prior to Visit  Medication Sig Dispense Refill  . acetaminophen (TYLENOL) 325 MG tablet Take 650 mg by mouth. 6A, 12N, 6P, 12MN    . clindamycin (CLEOCIN T) 1 % lotion Apply 1 application topically every morning.     . fluticasone (FLONASE) 50 MCG/ACT nasal spray Place 2 sprays into both nostrils daily. 16  g 0  . Magnesium Hydroxide (MILK OF MAGNESIA PO) Take 30 mLs by mouth daily as needed.    . methocarbamol (ROBAXIN) 500 MG tablet Take 500 mg by mouth 3 (three) times daily. 6A, 2P, 10P    . Naphazoline-Glycerin-Zinc Sulf (CLEAR EYES MAXIMUM ITCHY EYE) 0.012-0.25-0.25 % SOLN Place 1 drop into both eyes daily as needed (for dry, red, itchy eyes).    Marland Kitchen oxyCODONE-acetaminophen (ROXICET) 5-325 MG tablet Take 1 tablet by mouth every 4 (four) hours as needed. 180 tablet 0  . traMADol (ULTRAM) 50 MG tablet Take 50 mg by mouth every 6 (six) hours as needed. 6A, 12N, 6P, 12MN    . aspirin EC 325 MG tablet Take 1 tablet (325 mg total) by mouth 2 (two) times daily. (Patient not taking: Reported on 08/13/2015) 30 tablet 0   No current facility-administered medications on file prior to visit.     BP 118/76 (BP Location: Left Arm, Patient Position: Sitting, Cuff Size: Large)   Pulse 94   Temp 99 F (37.2 C) (Oral)   Ht 5\' 7"  (1.702 m)   Wt 229 lb 8 oz (104.1 kg)   LMP 07/22/2015 (Exact Date)   SpO2 98%   BMI 35.94 kg/m       Objective:   Physical Exam  Constitutional: She is oriented to person, place, and time. She appears well-developed and well-nourished.  Eyes: Pupils are equal, round, and reactive to light. No scleral icterus.  Neck: Neck supple.  Cardiovascular: Normal rate, regular rhythm and normal heart sounds.   Pulmonary/Chest: Effort normal and breath sounds normal. She has no wheezes. She has no rales.  Abdominal: Soft. Normal appearance and bowel sounds are normal. She exhibits no distension. There is tenderness in the left lower quadrant. There is no rebound, no guarding, no CVA tenderness, no tenderness at McBurney's point and negative Murphy's sign.  Lymphadenopathy:    She has no cervical adenopathy.  Neurological: She is alert and oriented to person, place, and time.  Ambulating with cane following total right knee replacement  Skin: Skin is warm and dry. No rash noted.    Psychiatric: She has a normal mood and affect. Her behavior is normal. Judgment and thought content normal.       Assessment & Plan:  1. Abdominal discomfort, generalized Bowel sounds present in all quadrants; Patient is having bowel movements; history of diverticulosis; no guarding or rebound present today. These symptoms and history provide support for empiric treatment for diverticulitis. Will obtain lab work and CT scan today to rule out perforation.  - CBC with Differential/Platelet - Comprehensive metabolic panel - CT Abdomen Pelvis W Contrast; Future - POCT urine pregnancy - ciprofloxacin (CIPRO) 500 MG tablet;  Take 1 tablet (500 mg total) by mouth 2 (two) times daily.  Dispense: 20 tablet; Refill: 0 - metroNIDAZOLE (FLAGYL) 500 MG tablet; Take 1 tablet (500 mg total) by mouth 3 (three) times daily.  Dispense: 30 tablet; Refill: 0  2. Nausea without vomiting   Discussed plan of care with patient. Results will be called to her when received and any additional necessary treatment will be established. Patient voiced understanding and agreed with plan.  We discussed treatment for suspected diverticulitis and reasons for obtaining imaging today. Further discussed the importance for her to follow up with her PCP after current treatment plan has been established.   Delano Metz, FNP-C

## 2015-08-13 NOTE — Progress Notes (Signed)
Pre visit review using our clinic review tool, if applicable. No additional management support is needed unless otherwise documented below in the visit note. 

## 2015-08-13 NOTE — Patient Instructions (Addendum)
We have ordered labs or studies at this visit. It can take up to 1-2 weeks for results and processing. IF results require follow up or explanation, we will call you with instructions. Clinically stable results will be released to your [facility]. If you have not heard from [place] or cannot find your results in [facility] in 2 weeks please contact our office at [phone].  If you are not yet signed up for [facility], please consider signing up    Please take antibiotic as directed and follow up with your PCP this week. Your CT results will be called to you either later today or tomorrow.

## 2015-08-14 ENCOUNTER — Inpatient Hospital Stay: Admission: RE | Admit: 2015-08-14 | Payer: BC Managed Care – PPO | Source: Ambulatory Visit

## 2015-12-12 ENCOUNTER — Ambulatory Visit: Payer: BC Managed Care – PPO | Admitting: Primary Care

## 2015-12-13 ENCOUNTER — Encounter: Payer: Self-pay | Admitting: Primary Care

## 2015-12-13 ENCOUNTER — Other Ambulatory Visit: Payer: Self-pay | Admitting: Primary Care

## 2015-12-13 ENCOUNTER — Ambulatory Visit (INDEPENDENT_AMBULATORY_CARE_PROVIDER_SITE_OTHER): Payer: BC Managed Care – PPO | Admitting: Primary Care

## 2015-12-13 VITALS — BP 120/78 | HR 70 | Temp 98.2°F | Ht 67.0 in | Wt 219.8 lb

## 2015-12-13 DIAGNOSIS — R8290 Unspecified abnormal findings in urine: Secondary | ICD-10-CM | POA: Diagnosis not present

## 2015-12-13 DIAGNOSIS — Z113 Encounter for screening for infections with a predominantly sexual mode of transmission: Secondary | ICD-10-CM

## 2015-12-13 DIAGNOSIS — N898 Other specified noninflammatory disorders of vagina: Secondary | ICD-10-CM

## 2015-12-13 LAB — POC URINALSYSI DIPSTICK (AUTOMATED)
Bilirubin, UA: NEGATIVE
GLUCOSE UA: NEGATIVE
Ketones, UA: NEGATIVE
Leukocytes, UA: NEGATIVE
NITRITE UA: NEGATIVE
PROTEIN UA: NEGATIVE
Spec Grav, UA: >=1.03
UROBILINOGEN UA: NEGATIVE
pH, UA: 6

## 2015-12-13 NOTE — Progress Notes (Signed)
Pre visit review using our clinic review tool, if applicable. No additional management support is needed unless otherwise documented below in the visit note. 

## 2015-12-13 NOTE — Patient Instructions (Signed)
Complete lab work prior to leaving today.  We will notify you once we receive the results of your STD testing and vaginal swabs.   It was a pleasure to see you today!

## 2015-12-13 NOTE — Progress Notes (Signed)
Subjective:    Patient ID: Krystal Mann, female    DOB: 08-May-1970, 45 y.o.   MRN: [id number]  HPI  Krystal Mann is a 45 year old female who presents today with a chief complaint of foul smelling odor. She also reports dysuria, cloudy urine. Her symptoms have been present for the past 1 week. She has noticed a little more whitish discharge than usual. She denies abdominal pain, fevers, vaginal itching, hematuria. She does have a history of UTI in the past, but this does not feel similar. She's not taken anything OTC for her symptoms but is still managed on Tramadol and Percocet.   Review of Systems  Constitutional: Negative for fever.  Gastrointestinal: Negative for abdominal pain and nausea.  Genitourinary: Positive for dysuria, frequency and vaginal discharge. Negative for flank pain, hematuria and pelvic pain.       Past Medical History:  Diagnosis Date  . Anemia   . Arthritis    knee  . Back pain   . Chicken pox   . Depression 2004  . Diverticulitis   . Migraines    no longer has them. Was medication related  . Rotator cuff (capsule) sprain    right  . Seasonal allergies   . Shortness of breath dyspnea    occasionally     Social History   Social History  . Marital status: Divorced    Spouse name: N/A  . Number of children: 2  . Years of education: N/A   Occupational History  . 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 Smoking status: Never Smoker  . Smokeless tobacco: Never Used  . Alcohol use No  . Drug use: No  . Sexual activity: Yes    Partners: Male    Birth control/ protection: Surgical     Comment: tubial ligation   Other Topics Concern  . Not on file   Social History Narrative   Single.   2 Children, 1 grandson.   Works as a Teacher, early years/pre.   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    Past Surgical History:  Procedure Laterality Date  . CERVICAL DISCECTOMY   2012  . TOTAL KNEE ARTHROPLASTY Right 06/18/2015   Procedure: TOTAL KNEE ARTHROPLASTY;  Surgeon: Frederik Pear, MD;  Location: [place];  Service: Orthopedics;  Laterality: Right;  . TUBAL LIGATION      Family History  Problem Relation Age of Onset  . Kidney cancer Mother     malaginant cancer (kidney), skin cancer  . Diabetes Mother   . Hyperlipidemia Mother   . Lung cancer Father   . Diabetes Sister   . Asthma Sister   . Skin cancer Mother   . Colon polyps Brother 41    x 21 removed  . Liver cancer Brother 40  . Colon polyps Brother     Allergies  Allergen Reactions  . Hydroxyprogesterone Caproate Other (See Comments)    Severe headache  . Morphine And Related Itching and Nausea And Vomiting  . Hydrocodone Itching    Current Outpatient Prescriptions on File Prior to Visit  Medication Sig Dispense Refill  . acetaminophen (TYLENOL) 325 MG tablet Take 650 mg by mouth. 6A, 12N, 6P, 12MN    . clindamycin (CLEOCIN T) 1 % lotion Apply 1 application topically every morning.     . fluticasone (FLONASE) 50 MCG/ACT nasal spray Place 2 sprays into both  nostrils daily. 16 g 0  . Magnesium Hydroxide (MILK OF MAGNESIA PO) Take 30 mLs by mouth daily as needed.    . Naphazoline-Glycerin-Zinc Sulf (CLEAR EYES MAXIMUM ITCHY EYE) 0.012-0.25-0.25 % SOLN Place 1 drop into both eyes daily as needed (for dry, red, itchy eyes).    Marland Kitchen oxyCODONE-acetaminophen (ROXICET) 5-325 MG tablet Take 1 tablet by mouth every 4 (four) hours as needed. 180 tablet 0  . traMADol (ULTRAM) 50 MG tablet Take 50 mg by mouth every 6 (six) hours as needed. 6A, 12N, 6P, 12MN    . aspirin EC 325 MG tablet Take 1 tablet (325 mg total) by mouth 2 (two) times daily. (Patient not taking: Reported on 12/13/2015) 30 tablet 0  . methocarbamol (ROBAXIN) 500 MG tablet Take 500 mg by mouth 3 (three) times daily. 6A, 2P, 10P     No current facility-administered medications on file prior to visit.     BP 120/78   Pulse 70   Temp 98.2 F  (36.8 C) (Oral)   Ht 5\' 7"  (1.702 m)   Wt 219 lb 12.8 oz (99.7 kg)   LMP 11/30/2015   SpO2 98%   BMI 34.43 kg/m    Objective:   Physical Exam  Constitutional: She appears well-nourished.  Neck: Neck supple.  Cardiovascular: Normal rate and regular rhythm.   Pulmonary/Chest: Effort normal and breath sounds normal.  Abdominal: Soft. Bowel sounds are normal. There is no tenderness.  Genitourinary: There is no lesion on the right labia. There is no lesion on the left labia. Cervix exhibits no motion tenderness and no discharge. No erythema in the vagina. No vaginal discharge found.  Skin: Skin is warm and dry.          Assessment & Plan:  Foul Smelling Urine:  Also with dysuria, vaginal discharge, dysuria, UA: negative for leuks and nitirites, 1+ blood. Wet Prep completed and pending. Gonorrhea and Chlamydia testing completed through vaginal specimen. STD testing completed through blood draw. Do not suspect renal stone, acute pyleonephritis. Will await test results.  Sheral Flow, NP

## 2015-12-14 ENCOUNTER — Other Ambulatory Visit: Payer: Self-pay | Admitting: Primary Care

## 2015-12-14 DIAGNOSIS — N760 Acute vaginitis: Principal | ICD-10-CM

## 2015-12-14 DIAGNOSIS — B9689 Other specified bacterial agents as the cause of diseases classified elsewhere: Secondary | ICD-10-CM

## 2015-12-14 LAB — GC/CHLAMYDIA PROBE AMP
CT Probe RNA: NOT DETECTED
GC Probe RNA: NOT DETECTED

## 2015-12-14 LAB — RPR

## 2015-12-14 LAB — WET PREP BY MOLECULAR PROBE
Candida species: NEGATIVE
Gardnerella vaginalis: POSITIVE — AB
Trichomonas vaginosis: NEGATIVE

## 2015-12-14 LAB — HEPATITIS C ANTIBODY: HCV Ab: NEGATIVE

## 2015-12-14 LAB — HIV ANTIBODY (ROUTINE TESTING W REFLEX): HIV 1&2 Ab, 4th Generation: NONREACTIVE

## 2015-12-14 MED ORDER — METRONIDAZOLE 0.75 % VA GEL: 1.0000 | Freq: Two times a day (BID) | VAGINAL | 0 refills | Status: DC

## 2015-12-17 ENCOUNTER — Encounter: Payer: Self-pay | Admitting: Primary Care

## 2015-12-17 LAB — HSV(HERPES SIMPLEX VRS) I + II AB-IGG: HSV 2 GLYCOPROTEIN G AB, IGG: 6.65 {index} — AB (ref <0.90)

## 2015-12-19 ENCOUNTER — Other Ambulatory Visit: Payer: Self-pay | Admitting: Surgery

## 2015-12-26 ENCOUNTER — Encounter (HOSPITAL_BASED_OUTPATIENT_CLINIC_OR_DEPARTMENT_OTHER): Payer: Self-pay | Admitting: *Deleted

## 2015-12-30 NOTE — H&P (Signed)
  Krystal Mann 12/19/2015 10:15 AM Location: [facility] Patient #: [id number] DOB: 1970/04/29 Single / Language: Krystal Mann / Race: Black or African American Female   History of Present Illness (Amori Cooperman A. Ninfa Linden MD; 12/19/2015 10:32 AM) The patient is a 45 year old female who presents with a complaint of Hidradenitis. This is a pleasant female that I saw one and a half years ago for bilateral axillary hidradenitis. She decide to hold off on wide excision of both her axillas and try to manage things medically but is still continue to have persistent draining sinus tracts in both her arm pits with increasing discomfort. She does not seem to be responding to antibiotics so is requesting surgical excision. She has not had any change in her medical care. She currently denies fevers.   Allergies (Sonya Bynum, CMA; 12/19/2015 10:16 AM) Morphine Sulfate (Concentrate) *ANALGESICS - OPIOID*  Hydrocodone-Acetaminophen *ANALGESICS - OPIOID*  Hydroxyprogesterone Caproate *CHEMICALS*   Medication History (Sonya Bynum, CMA; 12/19/2015 10:16 AM) Hydrocodone-Acetaminophen (5-325MG  Tablet, 1 (one) Tablet Oral every four hours, as needed, Taken starting 07/31/2014) Active. Clindamycin HCl (150MG  Capsule, 1 (one) Capsule Oral two times daily, Taken starting 08/07/2014) Active. Clindamycin Phosphate (1% Lotion, 1 (one) Lotion External two times daily, Taken starting 08/07/2014) Active. Clindamycin HCl (300MG  Capsule, Oral) Active. Ibuprofen (800MG  Tablet, Oral) Active. Valium (5MG  Tablet, Oral) Active. Naproxen (500MG  Tablet, Oral) Active. Medications Reconciled  Other PMH and PSH are unchanged   Vitals (Sonya Bynum CMA; 12/19/2015 10:16 AM) 12/19/2015 10:15 AM Weight: 220 lb Height: 67in Body Surface Area: 2.11 m Body Mass Index: 34.46 kg/m  Temp.: 97.31F(Temporal)  Pulse: 81 (Regular)  BP: 130/80 (Sitting, Left Arm, Standard     Physical Exam (Chamika Cunanan A.  Ninfa Linden MD; 12/19/2015 10:32 AM) The physical exam findings are as follows: Note:On examination, there are multiple tender, open draining sinus tracts in both her axilla was consistent with chronic hidradenitis Lungs clear CV RRR Abd soft, non tender Generally well in appearance   Assessment & Plan (Render Marley A. Ninfa Linden MD; 12/19/2015 10:33 AM) HYDRADENITIS (L73.2) Impression: This is bilateral axillary hidradenitis. I again discussed wide excision of both these areas. Again, I explained that this is not a curable disease and surgery is to control these draining sinus tracts. I discussed the risk of surgery which includes but is not limited to bleeding, infection, injury to surrounding structures, wound breakdown, recurrence, having chronic open wounds, etc. She understands and agrees to proceed with surgery

## 2015-12-31 ENCOUNTER — Ambulatory Visit (HOSPITAL_BASED_OUTPATIENT_CLINIC_OR_DEPARTMENT_OTHER)
Admission: RE | Admit: 2015-12-31 | Discharge: 2015-12-31 | Disposition: A | Discharge status: Home / Self Care | Payer: BC Managed Care – PPO | Source: Ambulatory Visit | Attending: Surgery | Admitting: Surgery

## 2015-12-31 ENCOUNTER — Encounter (HOSPITAL_BASED_OUTPATIENT_CLINIC_OR_DEPARTMENT_OTHER)
Admission: RE | Disposition: A | Discharge status: Home / Self Care | Payer: Self-pay | Source: Ambulatory Visit | Attending: Surgery

## 2015-12-31 ENCOUNTER — Ambulatory Visit (HOSPITAL_BASED_OUTPATIENT_CLINIC_OR_DEPARTMENT_OTHER): Payer: BC Managed Care – PPO | Admitting: Anesthesiology

## 2015-12-31 DIAGNOSIS — E669 Obesity, unspecified: Secondary | ICD-10-CM | POA: Insufficient documentation

## 2015-12-31 DIAGNOSIS — D649 Anemia, unspecified: Secondary | ICD-10-CM | POA: Diagnosis not present

## 2015-12-31 DIAGNOSIS — F329 Major depressive disorder, single episode, unspecified: Secondary | ICD-10-CM | POA: Insufficient documentation

## 2015-12-31 DIAGNOSIS — Z8719 Personal history of other diseases of the digestive system: Secondary | ICD-10-CM | POA: Insufficient documentation

## 2015-12-31 DIAGNOSIS — L732 Hidradenitis suppurativa: Secondary | ICD-10-CM | POA: Diagnosis not present

## 2015-12-31 DIAGNOSIS — M1990 Unspecified osteoarthritis, unspecified site: Secondary | ICD-10-CM | POA: Insufficient documentation

## 2015-12-31 DIAGNOSIS — Z888 Allergy status to other drugs, medicaments and biological substances status: Secondary | ICD-10-CM | POA: Diagnosis not present

## 2015-12-31 DIAGNOSIS — Z885 Allergy status to narcotic agent status: Secondary | ICD-10-CM | POA: Insufficient documentation

## 2015-12-31 DIAGNOSIS — Z6834 Body mass index (BMI) 34.0-34.9, adult: Secondary | ICD-10-CM | POA: Insufficient documentation

## 2015-12-31 DIAGNOSIS — R0602 Shortness of breath: Secondary | ICD-10-CM | POA: Insufficient documentation

## 2015-12-31 HISTORY — PX: HYDRADENITIS EXCISION: SHX5243

## 2015-12-31 HISTORY — PX: APPLICATION OF A-CELL OF CHEST/ABDOMEN: SHX6302

## 2015-12-31 SURGERY — EXCISION, HIDRADENITIS, AXILLA
Anesthesia: General | Site: Axilla | Laterality: Bilateral

## 2015-12-31 MED ORDER — DEXAMETHASONE SODIUM PHOSPHATE 10 MG/ML IJ SOLN
INTRAMUSCULAR | Status: AC
  Filled 2015-12-31: qty 1

## 2015-12-31 MED ORDER — SCOPOLAMINE 1 MG/3DAYS TD PT72: 1.0000 | MEDICATED_PATCH | TRANSDERMAL | Status: DC

## 2015-12-31 MED ORDER — CEFAZOLIN SODIUM-DEXTROSE 2-4 GM/100ML-% IV SOLN
INTRAVENOUS | Status: AC
  Filled 2015-12-31: qty 100

## 2015-12-31 MED ORDER — CEFAZOLIN SODIUM-DEXTROSE 2-4 GM/100ML-% IV SOLN
2.0000 g | INTRAVENOUS | Status: AC
  Administered 2015-12-31: 2 g via INTRAVENOUS

## 2015-12-31 MED ORDER — MIDAZOLAM HCL 2 MG/2ML IJ SOLN: 1.0000 mg | INTRAMUSCULAR | Status: DC | PRN

## 2015-12-31 MED ORDER — FENTANYL CITRATE (PF) 100 MCG/2ML IJ SOLN
INTRAMUSCULAR | Status: AC
  Filled 2015-12-31: qty 2

## 2015-12-31 MED ORDER — HYDROMORPHONE HCL 1 MG/ML IJ SOLN
INTRAMUSCULAR | Status: AC
  Filled 2015-12-31: qty 1

## 2015-12-31 MED ORDER — FENTANYL CITRATE (PF) 100 MCG/2ML IJ SOLN: 50.0000 ug | INTRAMUSCULAR | Status: DC | PRN

## 2015-12-31 MED ORDER — MIDAZOLAM HCL 5 MG/5ML IJ SOLN
INTRAMUSCULAR | Status: DC | PRN
  Administered 2015-12-31: 2 mg via INTRAVENOUS

## 2015-12-31 MED ORDER — FENTANYL CITRATE (PF) 100 MCG/2ML IJ SOLN
INTRAMUSCULAR | Status: DC | PRN
  Administered 2015-12-31: 100 ug via INTRAVENOUS

## 2015-12-31 MED ORDER — OXYCODONE HCL 5 MG PO TABS: 5.0000 mg | ORAL_TABLET | ORAL | Status: DC | PRN

## 2015-12-31 MED ORDER — BUPIVACAINE HCL (PF) 0.5 % IJ SOLN
INTRAMUSCULAR | Status: DC | PRN
  Administered 2015-12-31: 30 mL

## 2015-12-31 MED ORDER — PROPOFOL 10 MG/ML IV BOLUS
INTRAVENOUS | Status: DC | PRN
  Administered 2015-12-31: 200 mg via INTRAVENOUS

## 2015-12-31 MED ORDER — METOCLOPRAMIDE HCL 5 MG/ML IJ SOLN: 10.0000 mg | Freq: Once | INTRAMUSCULAR | Status: DC | PRN

## 2015-12-31 MED ORDER — ONDANSETRON HCL 4 MG/2ML IJ SOLN
INTRAMUSCULAR | Status: AC
  Filled 2015-12-31: qty 2

## 2015-12-31 MED ORDER — ACETAMINOPHEN 325 MG PO TABS: 650.0000 mg | ORAL_TABLET | ORAL | Status: DC | PRN

## 2015-12-31 MED ORDER — PROPOFOL 10 MG/ML IV BOLUS
INTRAVENOUS | Status: AC
  Filled 2015-12-31: qty 20

## 2015-12-31 MED ORDER — LIDOCAINE 2% (20 MG/ML) 5 ML SYRINGE
INTRAMUSCULAR | Status: AC
  Filled 2015-12-31: qty 5

## 2015-12-31 MED ORDER — LIDOCAINE 2% (20 MG/ML) 5 ML SYRINGE
INTRAMUSCULAR | Status: DC | PRN
  Administered 2015-12-31: 100 mg via INTRAVENOUS

## 2015-12-31 MED ORDER — DEXAMETHASONE SODIUM PHOSPHATE 4 MG/ML IJ SOLN
INTRAMUSCULAR | Status: DC | PRN
  Administered 2015-12-31: 10 mg via INTRAVENOUS

## 2015-12-31 MED ORDER — LACTATED RINGERS IV SOLN
INTRAVENOUS | Status: DC | PRN
  Administered 2015-12-31: 12:00:00 via INTRAVENOUS

## 2015-12-31 MED ORDER — SODIUM CHLORIDE 0.9% FLUSH: 3.0000 mL | Freq: Two times a day (BID) | INTRAVENOUS | Status: DC

## 2015-12-31 MED ORDER — SCOPOLAMINE 1 MG/3DAYS TD PT72
1.0000 | MEDICATED_PATCH | Freq: Once | TRANSDERMAL | Status: DC | PRN
  Administered 2015-12-31: 1.5 mg via TRANSDERMAL

## 2015-12-31 MED ORDER — HYDROMORPHONE HCL 1 MG/ML IJ SOLN
0.2500 mg | INTRAMUSCULAR | Status: DC | PRN
  Administered 2015-12-31 (×3): 0.5 mg via INTRAVENOUS

## 2015-12-31 MED ORDER — MIDAZOLAM HCL 2 MG/2ML IJ SOLN
INTRAMUSCULAR | Status: AC
  Filled 2015-12-31: qty 2

## 2015-12-31 MED ORDER — MEPERIDINE HCL 25 MG/ML IJ SOLN: 6.2500 mg | INTRAMUSCULAR | Status: DC | PRN

## 2015-12-31 MED ORDER — FENTANYL CITRATE (PF) 100 MCG/2ML IJ SOLN: 25.0000 ug | INTRAMUSCULAR | Status: DC | PRN

## 2015-12-31 MED ORDER — CHLORHEXIDINE GLUCONATE CLOTH 2 % EX PADS
6.0000 | MEDICATED_PAD | Freq: Once | CUTANEOUS | Status: DC
Start: 2015-12-31 — End: 2015-12-31

## 2015-12-31 MED ORDER — OXYCODONE-ACETAMINOPHEN 5-325 MG PO TABS: 1.0000 | ORAL_TABLET | ORAL | 0 refills | Status: DC | PRN

## 2015-12-31 MED ORDER — SCOPOLAMINE 1 MG/3DAYS TD PT72
MEDICATED_PATCH | TRANSDERMAL | Status: AC
  Filled 2015-12-31: qty 1

## 2015-12-31 MED ORDER — CHLORHEXIDINE GLUCONATE CLOTH 2 % EX PADS: 6.0000 | MEDICATED_PAD | Freq: Once | CUTANEOUS | Status: DC

## 2015-12-31 MED ORDER — DOXYCYCLINE HYCLATE 100 MG PO TABS: 100.0000 mg | ORAL_TABLET | Freq: Two times a day (BID) | ORAL | 1 refills | Status: DC

## 2015-12-31 MED ORDER — LIDOCAINE HCL (PF) 1 % IJ SOLN
INTRAMUSCULAR | Status: AC
  Filled 2015-12-31: qty 30

## 2015-12-31 MED ORDER — SODIUM CHLORIDE 0.9% FLUSH: 3.0000 mL | INTRAVENOUS | Status: DC | PRN

## 2015-12-31 MED ORDER — LACTATED RINGERS IV SOLN
INTRAVENOUS | Status: DC
  Administered 2015-12-31 (×2): via INTRAVENOUS

## 2015-12-31 MED ORDER — KETOROLAC TROMETHAMINE 30 MG/ML IJ SOLN
INTRAMUSCULAR | Status: DC | PRN
  Administered 2015-12-31: 30 mg via INTRAVENOUS

## 2015-12-31 MED ORDER — ACETAMINOPHEN 650 MG RE SUPP: 650.0000 mg | RECTAL | Status: DC | PRN

## 2015-12-31 MED ORDER — SODIUM CHLORIDE 0.9 % IV SOLN: 250.0000 mL | INTRAVENOUS | Status: DC | PRN

## 2015-12-31 SURGICAL SUPPLY — 42 items
BLADE CLIPPER SURG (BLADE) ×2 IMPLANT
BLADE HEX COATED 2.75 (ELECTRODE) ×2 IMPLANT
BLADE SURG 15 STRL LF DISP TIS (BLADE) ×1 IMPLANT
BLADE SURG 15 STRL SS (BLADE) ×1
CANISTER SUCT 1200ML W/VALVE (MISCELLANEOUS) ×2 IMPLANT
CHLORAPREP W/TINT 26ML (MISCELLANEOUS) IMPLANT
COVER BACK TABLE 60X90IN (DRAPES) ×2 IMPLANT
COVER MAYO STAND STRL (DRAPES) ×2 IMPLANT
DECANTER SPIKE VIAL GLASS SM (MISCELLANEOUS) IMPLANT
DERMABOND ADVANCED (GAUZE/BANDAGES/DRESSINGS)
DERMABOND ADVANCED .7 DNX12 (GAUZE/BANDAGES/DRESSINGS) IMPLANT
DRAPE LAPAROTOMY 100X72 PEDS (DRAPES) IMPLANT
DRAPE LAPAROTOMY TRNSV 102X78 (DRAPE) ×2 IMPLANT
DRAPE UTILITY XL STRL (DRAPES) ×4 IMPLANT
ELECT REM PT RETURN 9FT ADLT (ELECTROSURGICAL) ×2
ELECTRODE REM PT RTRN 9FT ADLT (ELECTROSURGICAL) ×1 IMPLANT
GLOVE BIOGEL PI IND STRL 7.0 (GLOVE) ×1 IMPLANT
GLOVE BIOGEL PI INDICATOR 7.0 (GLOVE) ×1
GLOVE ECLIPSE 6.5 STRL STRAW (GLOVE) ×2 IMPLANT
GLOVE EXAM NITRILE EXT CUFF MD (GLOVE) ×2 IMPLANT
GLOVE SURG SIGNA 7.5 PF LTX (GLOVE) ×2 IMPLANT
GOWN STRL REUS W/ TWL LRG LVL3 (GOWN DISPOSABLE) ×1 IMPLANT
GOWN STRL REUS W/ TWL XL LVL3 (GOWN DISPOSABLE) ×1 IMPLANT
GOWN STRL REUS W/TWL LRG LVL3 (GOWN DISPOSABLE) ×1
GOWN STRL REUS W/TWL XL LVL3 (GOWN DISPOSABLE) ×1
MICROMATRIX 1000MG (Tissue) ×2 IMPLANT
NEEDLE HYPO 25X1 1.5 SAFETY (NEEDLE) ×2 IMPLANT
NS IRRIG 1000ML POUR BTL (IV SOLUTION) ×2 IMPLANT
PACK BASIN DAY SURGERY FS (CUSTOM PROCEDURE TRAY) ×2 IMPLANT
PENCIL BUTTON HOLSTER BLD 10FT (ELECTRODE) ×2 IMPLANT
SLEEVE SCD COMPRESS KNEE MED (MISCELLANEOUS) ×2 IMPLANT
SOLUTION PARTIC MCRMTRX 1000MG (Tissue) ×1 IMPLANT
SPONGE LAP 18X18 X RAY DECT (DISPOSABLE) ×2 IMPLANT
SPONGE LAP 4X18 X RAY DECT (DISPOSABLE) IMPLANT
SUT ETHILON 2 0 FS 18 (SUTURE) ×8 IMPLANT
SUT ETHILON 3 0 FSL (SUTURE) ×4 IMPLANT
SYR BULB 3OZ (MISCELLANEOUS) ×2 IMPLANT
SYR CONTROL 10ML LL (SYRINGE) ×2 IMPLANT
TOWEL OR 17X24 6PK STRL BLUE (TOWEL DISPOSABLE) ×2 IMPLANT
TOWEL OR NON WOVEN STRL DISP B (DISPOSABLE) IMPLANT
TUBE CONNECTING 20X1/4 (TUBING) ×2 IMPLANT
YANKAUER SUCT BULB TIP NO VENT (SUCTIONS) ×2 IMPLANT

## 2015-12-31 NOTE — Anesthesia Preprocedure Evaluation (Signed)
Anesthesia Evaluation  Patient identified by MRN, date of birth, ID band Patient awake    Reviewed: Allergy & Precautions, NPO status , Patient's Chart, lab work & pertinent test results  Airway Mallampati: III       Dental no notable dental hx. (+) Teeth Intact   Pulmonary shortness of breath and with exertion,    Pulmonary exam normal breath sounds clear to auscultation       Cardiovascular negative cardio ROS Normal cardiovascular exam Rhythm:Regular Rate:Normal     Neuro/Psych  Headaches, PSYCHIATRIC DISORDERS Depression    GI/Hepatic Neg liver ROS, Hx/o diverticulitis   Endo/Other  Obesity  Renal/GU negative Renal ROS  negative genitourinary   Musculoskeletal  (+) Arthritis , Bilateral axillary hidradenitis Back pain   Abdominal (+) + obese,   Peds  Hematology  (+) anemia ,   Anesthesia Other Findings   Reproductive/Obstetrics                             Anesthesia Physical Anesthesia Plan  ASA: II  Anesthesia Plan: General   Post-op Pain Management:    Induction: Intravenous  Airway Management Planned: LMA and Oral ETT  Additional Equipment:   Intra-op Plan:   Post-operative Plan: Extubation in OR  Informed Consent: I have reviewed the patients History and Physical, chart, labs and discussed the procedure including the risks, benefits and alternatives for the proposed anesthesia with the patient or authorized representative who has indicated his/her understanding and acceptance.   Dental advisory given  Plan Discussed with: Anesthesiologist, CRNA and Surgeon  Anesthesia Plan Comments:         Anesthesia Quick Evaluation

## 2015-12-31 NOTE — Interval H&P Note (Signed)
History and Physical Interval Note:no change in H and P  12/31/2015 11:35 AM  Krystal Mann  has presented today for surgery, with the diagnosis of Bilateral axillary hidradenitis  The various methods of treatment have been discussed with the patient and family. After consideration of risks, benefits and other options for treatment, the patient has consented to  Procedure(s): WIDE EXCISION BILATERAL AXILLARY HIDRADENITIS (Bilateral) as a surgical intervention .  The patient's history has been reviewed, patient examined, no change in status, stable for surgery.  I have reviewed the patient's chart and labs.  Questions were answered to the patient's satisfaction.     Gianah Batt A

## 2015-12-31 NOTE — Anesthesia Postprocedure Evaluation (Signed)
Anesthesia Post Note  [name]  Procedure(s) Performed: Procedure(s) (LRB): WIDE EXCISION BILATERAL AXILLARY HIDRADENITIS WITH ACELL APPLICATION (Bilateral) APPLICATION OF A-CELL OF BILATERAL AXILLA (Bilateral)  Patient location during evaluation: PACU Anesthesia Type: General Level of consciousness: awake and alert and oriented Pain management: pain level controlled Vital Signs Assessment: post-procedure vital signs reviewed and stable Respiratory status: spontaneous breathing, nonlabored ventilation and respiratory function stable Cardiovascular status: blood pressure returned to baseline and stable Postop Assessment: no signs of nausea or vomiting Anesthetic complications: no       Last Vitals:  Vitals:   12/31/15 1400 12/31/15 1415  BP: 113/77 115/74  Pulse: 78 77  Resp: 12 14  Temp:      Last Pain:  Vitals:   12/31/15 1415  TempSrc:   PainSc: 3                  Hula Tasso A.

## 2015-12-31 NOTE — Anesthesia Procedure Notes (Signed)
Procedure Name: LMA Insertion Date/Time: 12/31/2015 12:25 PM Performed by: Lyndee Leo Pre-anesthesia Checklist: Patient identified, Emergency Drugs available, Suction available and Patient being monitored Patient Re-evaluated:Patient Re-evaluated prior to inductionOxygen Delivery Method: Circle system utilized Preoxygenation: Pre-oxygenation with 100% oxygen Intubation Type: IV induction Ventilation: Mask ventilation without difficulty LMA: LMA inserted LMA Size: 4.0 Number of attempts: 1 Airway Equipment and Method: Bite block Placement Confirmation: positive ETCO2 Tube secured with: Tape Dental Injury: Teeth and Oropharynx as per pre-operative assessment

## 2015-12-31 NOTE — Transfer of Care (Signed)
Immediate Anesthesia Transfer of Care Note  [name]  Procedure(s) Performed: Procedure(s): WIDE EXCISION BILATERAL AXILLARY HIDRADENITIS WITH ACELL APPLICATION (Bilateral)   Patient Location: PACU  Anesthesia Type:General  Level of Consciousness: awake and sedated  Airway & Oxygen Therapy: Patient Spontanous Breathing and Patient connected to face mask oxygen  Post-op Assessment: Report given to RN and Post -op Vital signs reviewed and stable  Post vital signs: Reviewed and stable  Last Vitals:  Vitals:   12/31/15 1218  BP: 119/74  Pulse: 83  Resp: 18  Temp: 36.9 C    Last Pain:  Vitals:   12/31/15 1218  TempSrc: Oral  PainSc: 0-No pain         Complications: No apparent anesthesia complications

## 2015-12-31 NOTE — Op Note (Addendum)
WIDE EXCISION BILATERAL AXILLARY HIDRADENITIS WITH ACELL APPLICATION  Procedure Note  Krystal Mann 12/31/2015   Pre-op Diagnosis: Bilateral axillary hidradenitis     Post-op Diagnosis: same  Procedure(s): WIDE EXCISION BILATERAL AXILLARY HIDRADENITIS WITH ACELL APPLICATION  Surgeon(s): Coralie Keens, MD  Anesthesia: General  Staff:  Circulator: Ted Mcalpine, RN Scrub Person: Eston Esters, RN  Estimated Blood Loss: Minimal               Findings: Patient was found to have bilateral axillary hidradenitis. There was no gross purulence. I excised a 12 cm area of skin and subjacent tissue from the right axilla and a 14 cm area of skin and subjacent tissue from the left axilla. I applied a-cell xenograft powder into the wounds prior to closure.  Procedure: The patient was brought to the operating room and identified as the correct patient. She was placed supine on the operative table and general anesthesia was induced. Her axials were then prepped and draped in usual sterile fashion. I anesthetized both areas with Marcaine. I then performed an elliptical incision around chronic sinus tracts in both axilla with a scalpel. I then took the incisions down into the subjacent tissue with electrocautery. I excised approximate 12 cm of skin and subjacent tissue from the right axilla and 14 cm from the left axilla. This removed all chronic areas. I then irrigated both wounds saline. I anesthetized further with Marcaine. I then placed a-cell powder into both wounds. I then closed both incisions with interrupted and mattress 2-0 nylon sutures. Gauze and tape were then applied. The patient tolerated procedure well. All the counts were correct at the end of the procedure. The patient was then extended in the operating room and taken in a stable condition to the recovery room.   Krystal Mann A   Date: 12/31/2015  Time: 1:11 PM

## 2015-12-31 NOTE — Discharge Instructions (Signed)
Ok to shower tomorrow  Cover wounds with gauze daily  Ice pack and ibuprofen also for pain   Call your surgeon if you experience:   1.  Fever over 101.0. 2.  Inability to urinate. 3.  Nausea and/or vomiting. 4.  Extreme swelling or bruising at the surgical site. 5.  Continued bleeding from the incision. 6.  Increased pain, redness or drainage from the incision. 7.  Problems related to your pain medication. 8.  Any problems and/or concerns    Post Anesthesia Home Care Instructions  Activity: Get plenty of rest for the remainder of the day. A responsible adult should stay with you for 24 hours following the procedure.  For the next 24 hours, DO NOT: -Drive a car -Paediatric nurse -Drink alcoholic beverages -Take any medication unless instructed by your physician -Make any legal decisions or sign important papers.  Meals: Start with liquid foods such as gelatin or soup. Progress to regular foods as tolerated. Avoid greasy, spicy, heavy foods. If nausea and/or vomiting occur, drink only clear liquids until the nausea and/or vomiting subsides. Call your physician if vomiting continues.  Special Instructions/Symptoms: Your throat may feel dry or sore from the anesthesia or the breathing tube placed in your throat during surgery. If this causes discomfort, gargle with warm salt water. The discomfort should disappear within 24 hours.  If you had a scopolamine patch placed behind your ear for the management of post- operative nausea and/or vomiting:  1. The medication in the patch is effective for 72 hours, after which it should be removed.  Wrap patch in a tissue and discard in the trash. Wash hands thoroughly with soap and water. 2. You may remove the patch earlier than 72 hours if you experience unpleasant side effects which may include dry mouth, dizziness or visual disturbances. 3. Avoid touching the patch. Wash your hands with soap and water after contact with the patch.

## 2016-01-01 ENCOUNTER — Encounter (HOSPITAL_BASED_OUTPATIENT_CLINIC_OR_DEPARTMENT_OTHER): Payer: Self-pay | Admitting: Surgery

## 2016-01-07 ENCOUNTER — Encounter (HOSPITAL_BASED_OUTPATIENT_CLINIC_OR_DEPARTMENT_OTHER): Payer: Self-pay | Admitting: Emergency Medicine

## 2016-01-07 ENCOUNTER — Emergency Department (HOSPITAL_BASED_OUTPATIENT_CLINIC_OR_DEPARTMENT_OTHER)
Admission: EM | Admit: 2016-01-07 | Discharge: 2016-01-07 | Disposition: A | Discharge status: Home / Self Care | Payer: BC Managed Care – PPO | Attending: Emergency Medicine | Admitting: Emergency Medicine

## 2016-01-07 DIAGNOSIS — Z4801 Encounter for change or removal of surgical wound dressing: Secondary | ICD-10-CM | POA: Diagnosis not present

## 2016-01-07 DIAGNOSIS — M79622 Pain in left upper arm: Secondary | ICD-10-CM | POA: Insufficient documentation

## 2016-01-07 DIAGNOSIS — Z79899 Other long term (current) drug therapy: Secondary | ICD-10-CM | POA: Insufficient documentation

## 2016-01-07 DIAGNOSIS — M79621 Pain in right upper arm: Secondary | ICD-10-CM | POA: Insufficient documentation

## 2016-01-07 DIAGNOSIS — G8918 Other acute postprocedural pain: Secondary | ICD-10-CM | POA: Diagnosis not present

## 2016-01-07 DIAGNOSIS — Z5189 Encounter for other specified aftercare: Secondary | ICD-10-CM

## 2016-01-07 NOTE — ED Triage Notes (Signed)
Pt had surgery under arms last Monday and started having heavy drainage under left arm.

## 2016-01-07 NOTE — ED Provider Notes (Signed)
[facility] DEPT [facility] Provider Note   CSN: [id number] Arrival date & time: 01/07/16  [id number]     History   Chief Complaint Chief Complaint  Patient presents with  . Wound Check    HPI Krystal Mann is a 45 y.o. female.  HPI   45 year old female with bilateral asked axillary hidradenitis excision with acell application with Dr. Ninfa Linden on 1218 presents with concern for drainage from the left axilla. Reports initially she had some blood from the right axilla, and has had mild drainage from the left axilla which is serious, however increased yesterday. Reports she took off her dressing yesterday because the tape was tearing her skin, and reports that she went out and noticed a significant amount of drainage coming from the left axilla, dripping down her arm. Reports she put on a bandage that became soaked, and then placed another bandage which so far has not soaked through. Denies any fevers, reports the pain has been significantly worse with palpation and movement, however has not changed since she initially had the surgery. Denies any redness, purulent drainage or other symptoms.  Past Medical History:  Diagnosis Date  . Anemia   . Arthritis    knee  . Back pain   . Chicken pox   . Depression 2004  . Diverticulitis   . Migraines    no longer has them. Was medication related  . Rotator cuff (capsule) sprain    right  . Seasonal allergies   . Shortness of breath dyspnea    occasionally    Patient Active Problem List   Diagnosis Date Noted  . Primary osteoarthritis of right knee 06/15/2015  . Preventative health care 01/01/2015  . Family history of familial polyposis - attenuated - brother 08/31/2014  . Family history of colon cancer 08/30/2014  . Axillary abscess 06/13/2014  . Knee pain, chronic 06/13/2014  . Obesity (BMI 35.0-39.9 without comorbidity) 06/13/2014  . Eczema 06/13/2014  . Breast lump on right side at 9 o'clock position 07/27/2012  . Bilateral lower  extremity edema 10/10/2011  . Encounter to establish care with new doctor 10/10/2011    Past Surgical History:  Procedure Laterality Date  . APPLICATION OF A-CELL OF CHEST/ABDOMEN Bilateral 12/31/2015   Procedure: APPLICATION OF A-CELL OF BILATERAL AXILLA;  Surgeon: Coralie Keens, MD;  Location: [facility];  Service: General;  Laterality: Bilateral;  . CERVICAL DISCECTOMY  2012  . HYDRADENITIS EXCISION Bilateral 12/31/2015   Procedure: WIDE EXCISION BILATERAL AXILLARY HIDRADENITIS WITH ACELL APPLICATION;  Surgeon: Coralie Keens, MD;  Location: [facility];  Service: General;  Laterality: Bilateral;  . JOINT REPLACEMENT    . TOTAL KNEE ARTHROPLASTY Right 06/18/2015   Procedure: TOTAL KNEE ARTHROPLASTY;  Surgeon: Frederik Pear, MD;  Location: [facility];  Service: Orthopedics;  Laterality: Right;  . TUBAL LIGATION      OB History    Gravida Para Term Preterm AB Living   3 2 1 1 1 2    SAB TAB Ectopic Multiple Live Births   1               Home Medications    Prior to Admission medications   Medication Sig Start Date End Date Taking? Authorizing Provider  acetaminophen (TYLENOL) 325 MG tablet Take 650 mg by mouth. 6A, 12N, 6P, 12MN    Historical Provider, MD  doxycycline (VIBRA-TABS) 100 MG tablet Take 1 tablet (100 mg total) by mouth 2 (two) times daily. 12/31/15   Coralie Keens, MD  fluticasone (FLONASE) 50 MCG/ACT nasal spray Place 2 sprays into both nostrils daily. 03/08/15   Ozella Almond Ward, PA-C  Magnesium Hydroxide (MILK OF MAGNESIA PO) Take 30 mLs by mouth daily as needed.    Historical Provider, MD  methocarbamol (ROBAXIN) 500 MG tablet Take 500 mg by mouth 3 (three) times daily. 6A, 2P, 10P    Historical Provider, MD  metroNIDAZOLE (METROGEL VAGINAL) 0.75 % vaginal gel Place 1 Applicatorful vaginally 2 (two) times daily. 12/14/15   Pleas Koch, NP  oxyCODONE-acetaminophen (ROXICET) 5-325 MG tablet Take 1-2 tablets by mouth every 4 (four)  hours as needed for moderate pain. 12/31/15   Coralie Keens, MD  traMADol (ULTRAM) 50 MG tablet Take 50 mg by mouth every 6 (six) hours as needed. 6A, 12N, 6P, 12MN    Historical Provider, MD    Family History Family History  Problem Relation Age of Onset  . Kidney cancer Mother     malaginant cancer (kidney), skin cancer  . Diabetes Mother   . Hyperlipidemia Mother   . Skin cancer Mother   . Lung cancer Father   . Diabetes Sister   . Asthma Sister   . Colon polyps Brother 41    x 21 removed  . Liver cancer Brother 16  . Colon polyps Brother     Social History Social History  Substance Use Topics  . Smoking status: Never Smoker  . Smokeless tobacco: Never Used  . Alcohol use 0.0 oz/week     Comment: social     Allergies   Hydroxyprogesterone caproate; Morphine and related; and Hydrocodone   Review of Systems Review of Systems  Constitutional: Negative for fever.  Respiratory: Negative for shortness of breath.   Gastrointestinal: Negative for nausea and vomiting.  Musculoskeletal: Positive for myalgias (pain under arms).  Skin: Positive for wound.     Physical Exam Updated Vital Signs BP 140/69 (BP Location: Right Wrist)   Pulse 76   Temp 98.3 F (36.8 C) (Oral)   Resp 18   Ht 5\' 7"  (1.702 m)   Wt 219 lb (99.3 kg)   LMP 12/23/2015   SpO2 100%   BMI 34.30 kg/m   Physical Exam  Constitutional: She is oriented to person, place, and time. She appears well-developed and well-nourished. No distress.  HENT:  Head: Normocephalic and atraumatic.  Eyes: Conjunctivae and EOM are normal.  Neck: Normal range of motion.  Cardiovascular: Normal rate, regular rhythm and intact distal pulses.   Pulmonary/Chest: Effort normal. No respiratory distress.  Musculoskeletal: She exhibits no edema or tenderness.  Tenderness bilateral axilla  Neurological: She is alert and oriented to person, place, and time.  Skin: Skin is warm and dry. No rash noted. She is not  diaphoretic. No erythema.  Bilateral axilla with incision, mattress sutures in place, wound clean, dry, intact, no surrounding erythema, no fluctuance, serous drainage from inferior portion of left incision, no purulence  Nursing note and vitals reviewed.    ED Treatments / Results  Labs (all labs ordered are listed, but only abnormal results are displayed) Labs Reviewed - No data to display  EKG  EKG Interpretation None       Radiology No results found.  Procedures Procedures (including critical care time)  Medications Ordered in ED Medications - No data to display   Initial Impression / Assessment and Plan / ED Course  I have reviewed the triage vital signs and the nursing notes.  Pertinent labs & imaging results that were available  during my care of the patient were reviewed by me and considered in my medical decision making (see chart for details).  Clinical Course    45 year old female with bilateral asked axillary hidradenitis excision with acell application with Dr. Ninfa Linden on 12/18 presents with concern for drainage from the left axilla.  Incision examined, no sign of surrounding erythema, no fluctuance, no purulent drainage, and have low suspicion for infection at this time. She does have some serous drainage coming from the left axilla, which I recommend follow up with Dr. Ninfa Linden. Patient discharged in stable condition with understanding of reasons to return.   Final Clinical Impressions(s) / ED Diagnoses   Final diagnoses:  Visit for wound check  Post-operative pain    New Prescriptions Discharge Medication List as of 01/07/2016 10:24 AM       Gareth Morgan, MD 01/07/16 1032

## 2016-01-22 ENCOUNTER — Other Ambulatory Visit: Payer: Self-pay | Admitting: Surgery

## 2016-08-10 IMAGING — CT CT ABD-PELV W/ CM
2 of 4 series · 16 of 46 positions shown, 18 images · IV contrast (ISOVUE 300)
Comparison: CT abdomen pelvis 12/29/2007

CLINICAL DATA: Left lower quadrant pain 3 days

EXAM:
CT ABDOMEN AND PELVIS WITH CONTRAST
TECHNIQUE: Multidetector CT imaging of the abdomen and pelvis was performed
using the standard protocol following bolus administration of
intravenous contrast.
CONTRAST:  100 mL Wsovue-UZZ IV

[Series 2: abd/ pelvis · axial · 0.87mm/px · z∈[+834,+1234]mm · 13 of 89 slices shown, 15 images]
[im 5/89  soft-tissue]
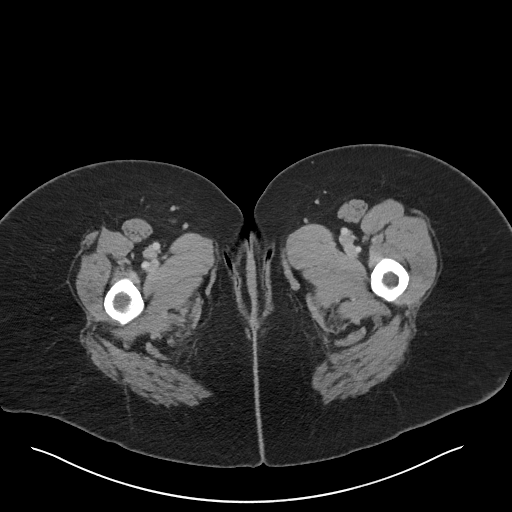
[im 5/89  bone]
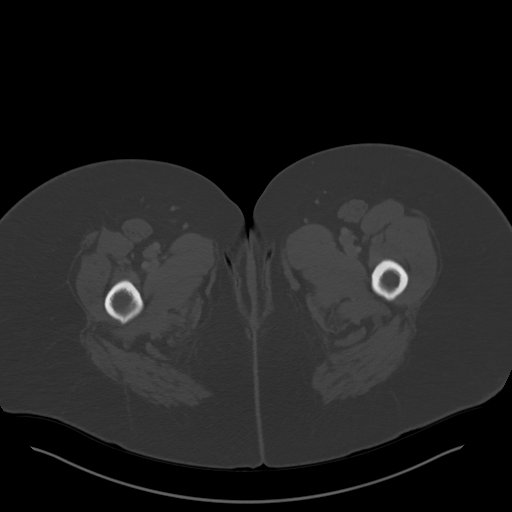
[im 13/89  soft-tissue]
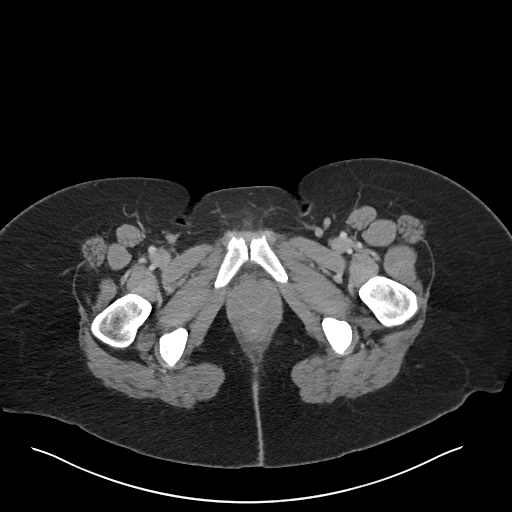
[im 21/89  soft-tissue]
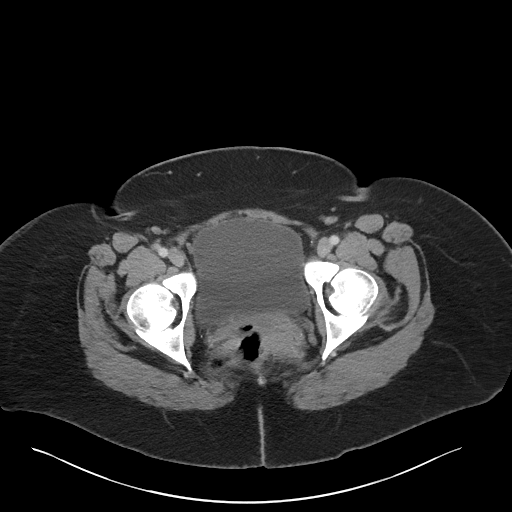
[im 25/89  soft-tissue]
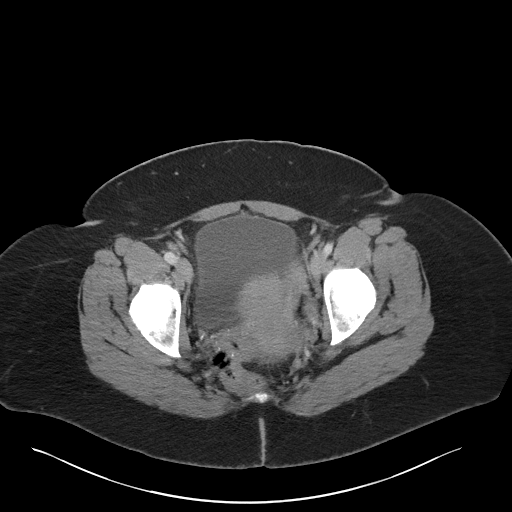
[im 33/89  soft-tissue]
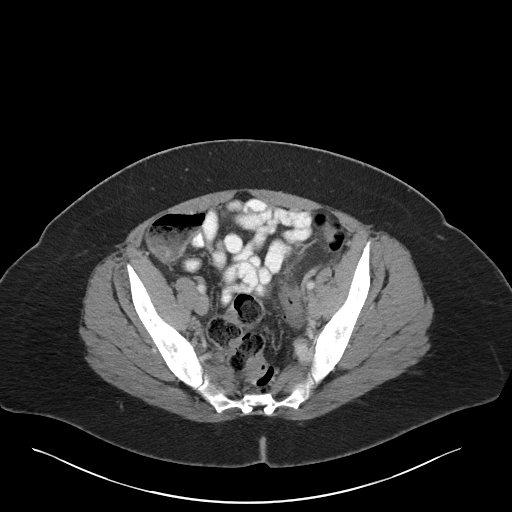
[im 37/89  soft-tissue]
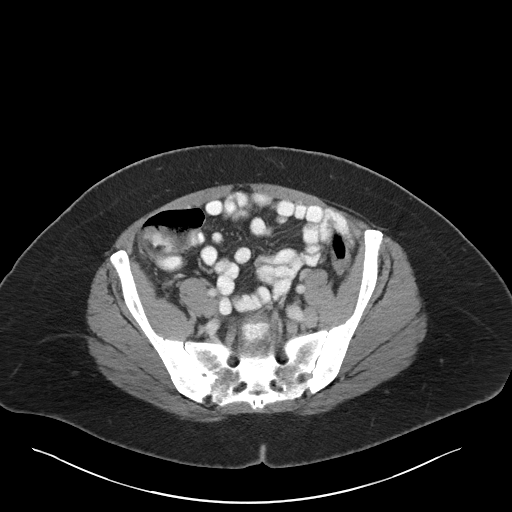
[im 45/89  soft-tissue]
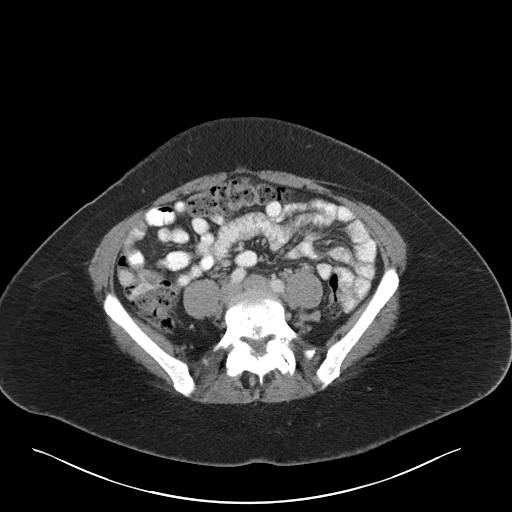
[im 53/89  soft-tissue]
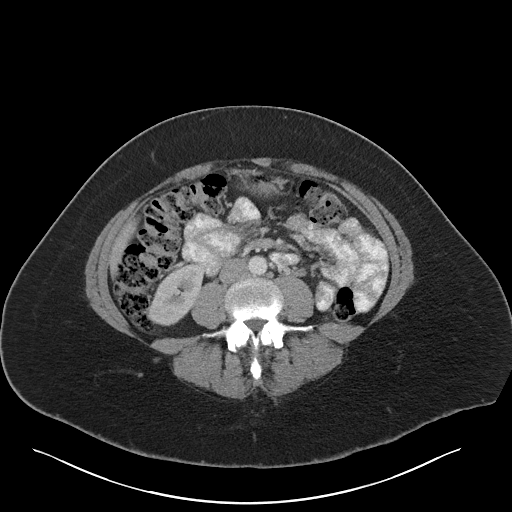
[im 57/89  soft-tissue]
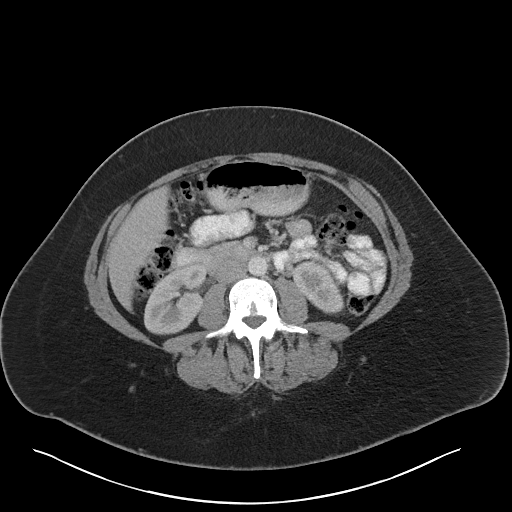
[im 57/89  bone]
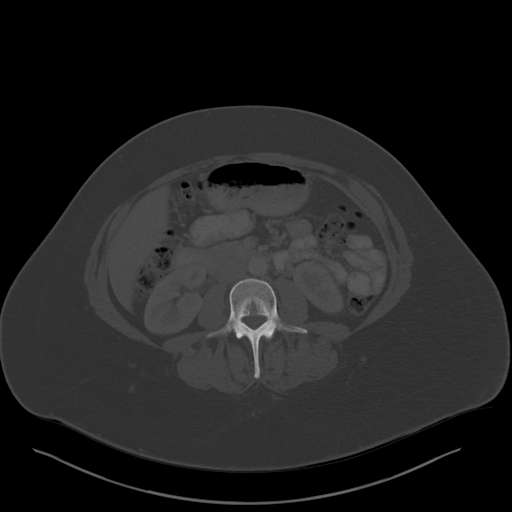
[im 65/89  soft-tissue]
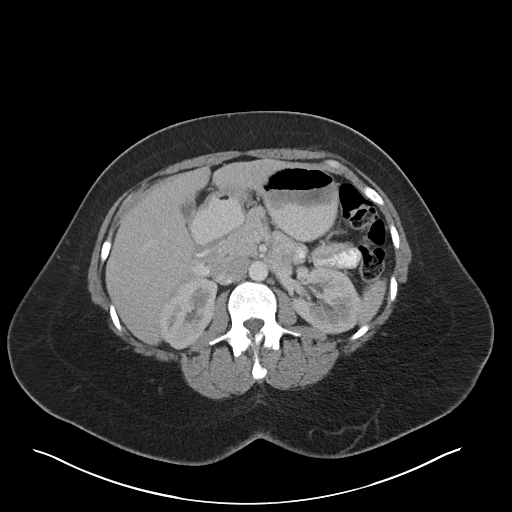
[im 69/89  soft-tissue]
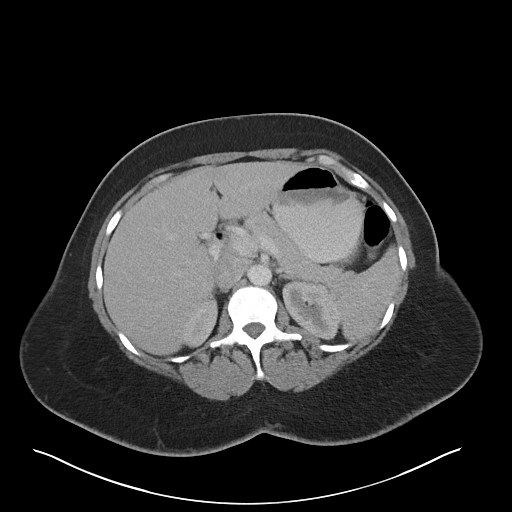
[im 77/89  soft-tissue]
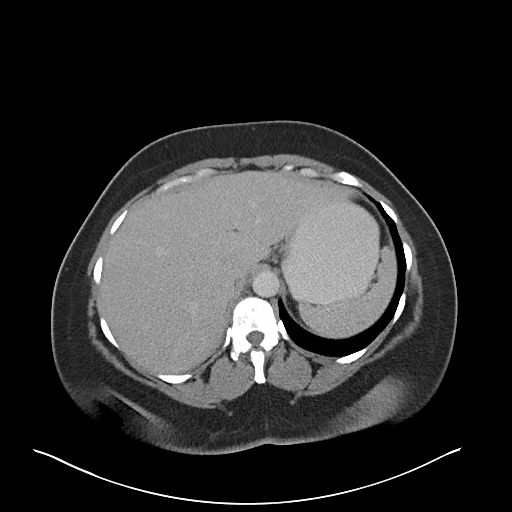
[im 85/89  soft-tissue]
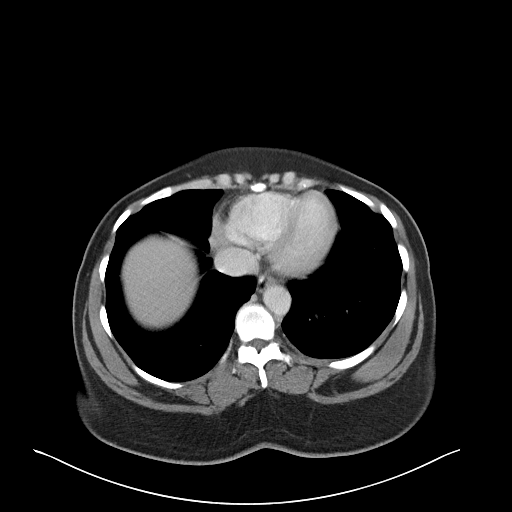

[Series 5: coronal soft tissue · coronal · 0.74mm/px · 3 of 98 slices shown]
[im 33/98  soft-tissue]
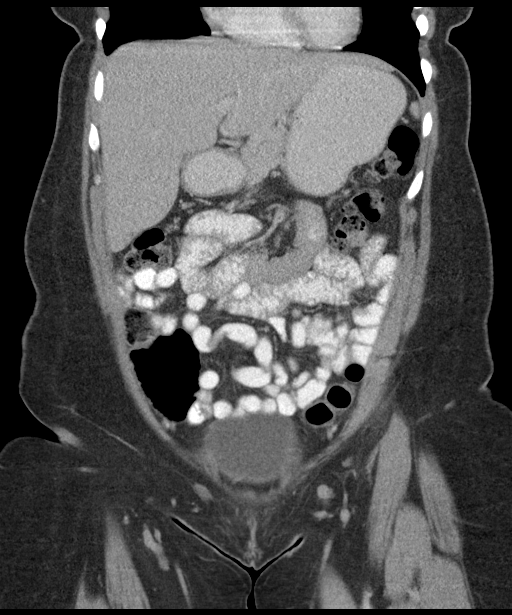
[im 44/98  soft-tissue]
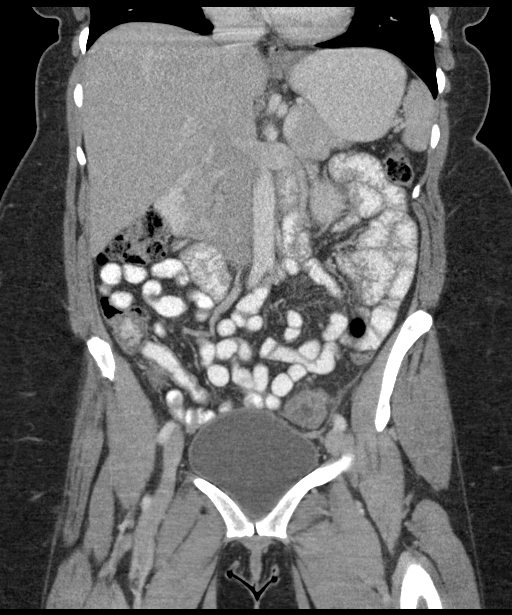
[im 54/98  soft-tissue]
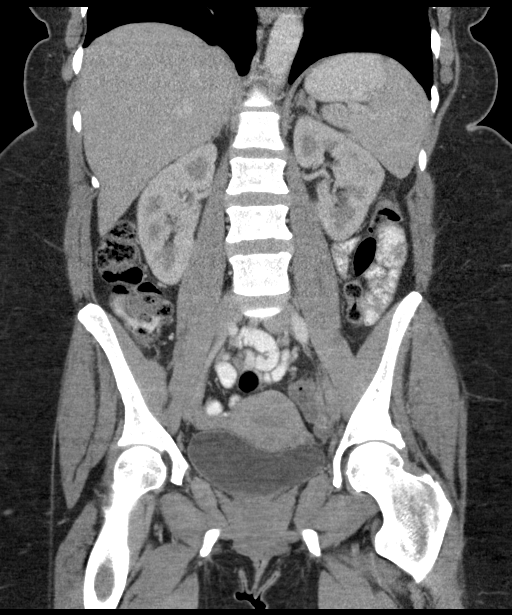

[16 of 46 positions shown; findings below may reference images not displayed]

FINDINGS: Lower chest:  Lung bases clear.

Hepatobiliary: Normal liver.  Normal gallbladder and bile ducts.

Pancreas: Negative

Spleen: Negative

Adrenals/Urinary Tract: Symmetric excretion of contrast by both
kidneys. No renal mass or obstruction.

Stomach/Bowel: 7 cm long area of thickening in the sigmoid colon.
There are diverticular changes distal to this segment. There is
minimal stranding in the pericolonic fat around the thickened
segment. This may represent diverticulitis but has somewhat of an
atypical appearance and close followup is warranted. No free fluid
or abscess. No free air. Negative for bowel obstruction. Normal
appearing appendix.

Vascular/Lymphatic: Normal aorta and IVC.  No lymphadenopathy

Reproductive: Normal uterus.  No adnexal mass.

Other: No free-fluid

Musculoskeletal: No acute skeletal abnormality.
IMPRESSION: Thickening of the sigmoid colon. There are diverticula in the
sigmoid colon and this is most likely due to diverticulitis however
there is only minimal stranding in the adjacent fat. Neoplasm not
considered likely however close follow-up and endoscopy after
treatment for diverticulitis is suggested.

Normal appendix.

## 2016-09-30 ENCOUNTER — Encounter: Payer: Self-pay | Admitting: Internal Medicine

## 2016-10-06 ENCOUNTER — Other Ambulatory Visit: Payer: BC Managed Care – PPO

## 2016-10-07 ENCOUNTER — Other Ambulatory Visit: Payer: BC Managed Care – PPO

## 2016-10-07 ENCOUNTER — Encounter: Payer: Self-pay | Admitting: *Deleted

## 2016-10-20 ENCOUNTER — Telehealth: Payer: Self-pay | Admitting: General Practice

## 2016-10-20 ENCOUNTER — Encounter: Payer: Self-pay | Admitting: General Practice

## 2016-10-20 NOTE — Telephone Encounter (Signed)
Patient's myriad myrisk test results are back. Called patient, no answer- left message stating we are trying to reach you regarding results, nothing urgent. You may also check your mychart account.

## 2016-10-22 ENCOUNTER — Telehealth: Payer: Self-pay | Admitting: General Practice

## 2016-10-22 NOTE — Telephone Encounter (Signed)
Called patient, no answer- left message stating we are calling to let you know we have results available for you to come by the office and pick up at your convenience.

## 2018-01-28 ENCOUNTER — Encounter: Payer: Self-pay | Admitting: Internal Medicine

## 2018-01-28 ENCOUNTER — Ambulatory Visit (INDEPENDENT_AMBULATORY_CARE_PROVIDER_SITE_OTHER): Payer: Self-pay | Admitting: Internal Medicine

## 2018-01-28 VITALS — BP 112/78 | HR 104 | Ht 68.0 in | Wt 270.2 lb

## 2018-01-28 DIAGNOSIS — L02419 Cutaneous abscess of limb, unspecified: Secondary | ICD-10-CM

## 2018-01-28 DIAGNOSIS — N939 Abnormal uterine and vaginal bleeding, unspecified: Secondary | ICD-10-CM

## 2018-01-28 MED ORDER — DOXYCYCLINE HYCLATE 100 MG PO CAPS: 100.0000 mg | ORAL_CAPSULE | Freq: Two times a day (BID) | ORAL | 0 refills | Status: DC

## 2018-01-28 NOTE — Progress Notes (Signed)
C/o cycle usually 4-5 days and last cycle lasted 22 days and was heavy and a lot of clots. The cycle before that was 5 days.

## 2018-01-28 NOTE — Patient Instructions (Signed)
We will call you with your ultrasound results. I am also checking blood counts due to your prolonged bleeding.   I have sent in an antibiotic for the boil in your armpit. Take this twice per day for 7 days. Apply a warm compress to the area at least 4x per day. It may need to opened up and drained at some point. If you need that done I would recommend going to an urgent care.

## 2018-01-28 NOTE — Progress Notes (Signed)
Subjective:    Krystal Mann - 48 y.o. female MRN [id number]  Date of birth: 1970/10/25  HPI  Krystal Mann is a 48 y.o. [id number] female here for concerns regarding recent long menstrual cycle. Reports that she had prolonged bleeding from Dec 22-Jan 11. Previously, she has had regular 28 day cycles with bleeding lasting approximately 5 days. While she has had occasional brown spotting since Jan 11, the bleeding has essentially resolved. Not on any hormonal contraception.  Declines STD testing. Denies abdominal pain, abnormal vaginal discharge, pelvic pain, fevers, vomiting, dysuria, hematuria. Had one similar heavy episode of bleeding when she was a teenager. No family history of reproductive cancers. No history of abnormal PAPs.   Also brings up painful bump in her right axilla that has been present for about 2-3 days. Has noted some drainage from the area. Has a history of hidradenitis and reports had surgery for this many years ago.     OB History    Gravida  3   Para  2   Term  2   Preterm  0   AB  1   Living  2     SAB  1   TAB      Ectopic      Multiple      Live Births               -  reports that she has never smoked. She has never used smokeless tobacco. - Review of Systems: Per HPI. - Past Medical History: Patient Active Problem List   Diagnosis Date Noted  . Primary osteoarthritis of right knee 06/15/2015  . Preventative health care 01/01/2015  . Family history of familial polyposis - attenuated - brother 08/31/2014  . Family history of colon cancer 08/30/2014  . Axillary abscess 06/13/2014  . Knee pain, chronic 06/13/2014  . Obesity (BMI 35.0-39.9 without comorbidity) 06/13/2014  . Eczema 06/13/2014  . Breast lump on right side at 9 o'clock position 07/27/2012  . Bilateral lower extremity edema 10/10/2011  . Encounter to establish care with new doctor 10/10/2011   - Medications: reviewed and updated   Objective:   Physical Exam BP 112/78    Pulse (!) 104   Ht 5\' 8"  (1.727 m)   Wt 270 lb 3.2 oz (122.6 kg)   LMP 12/29/2017   BMI 41.08 kg/m  Gen: NAD, alert, cooperative with exam, well-appearing Skin: Area of induration with crusted pustular drainage and surrounding erythema in the right axilla, no fluctuance appreciated.  GU/GYN: Deferred.   Assessment & Plan:   1. Abnormal uterine bleeding Suspect was simply an irregular menstrual period. However, given age will obtain transvaginal ultrasound to ensure no abnormalities with endometrial lining or presence of fibroids. Have ordered CBC due to prolonged course of bleeding.  - US PELVIC COMPLETE WITH TRANSVAGINAL; Future - CBC  2. Axillary abscess Appears to have superimposed skin infection. Will prescribe course of antibiotics and have recommended warm compresses. Anticipate that will continue to spontaneously drain but counseled patient that she may ultimately need I&D if does not resolve with conservative treatment.  - doxycycline (VIBRAMYCIN) 100 MG capsule; Take 1 capsule (100 mg total) by mouth 2 (two) times daily.  Dispense: 14 capsule; Refill: 0   Routine preventative health maintenance measures emphasized. Please refer to After Visit Summary for other counseling recommendations.   Return if symptoms worsen or fail to improve.  Phill Myron, D.O. OB Fellow  01/30/2018, 8:27 PM

## 2018-01-29 LAB — CBC
Hematocrit: 35.2 % (ref 34.0–46.6)
Hemoglobin: 11.8 g/dL (ref 11.1–15.9)
MCH: 28.1 pg (ref 26.6–33.0)
MCHC: 33.5 g/dL (ref 31.5–35.7)
MCV: 84 fL (ref 79–97)
PLATELETS: 332 10*3/uL (ref 150–450)
RBC: 4.2 x10E6/uL (ref 3.77–5.28)
RDW: 14.3 % (ref 11.7–15.4)
WBC: 6 10*3/uL (ref 3.4–10.8)

## 2018-02-01 ENCOUNTER — Ambulatory Visit (INDEPENDENT_AMBULATORY_CARE_PROVIDER_SITE_OTHER): Payer: Self-pay | Admitting: Nurse Practitioner

## 2018-02-01 ENCOUNTER — Encounter: Payer: Self-pay | Admitting: Nurse Practitioner

## 2018-02-01 DIAGNOSIS — N924 Excessive bleeding in the premenopausal period: Secondary | ICD-10-CM

## 2018-02-01 DIAGNOSIS — Z113 Encounter for screening for infections with a predominantly sexual mode of transmission: Secondary | ICD-10-CM

## 2018-02-01 LAB — POCT PREGNANCY, URINE: Preg Test, Ur: NEGATIVE

## 2018-02-01 MED ORDER — MEDROXYPROGESTERONE ACETATE 10 MG PO TABS: 10.0000 mg | ORAL_TABLET | Freq: Every day | ORAL | 0 refills | Status: DC

## 2018-02-01 MED ORDER — MEGESTROL ACETATE 40 MG PO TABS: ORAL_TABLET | ORAL | 1 refills | Status: DC

## 2018-02-01 MED ORDER — IBUPROFEN 600 MG PO TABS: 600.0000 mg | ORAL_TABLET | Freq: Four times a day (QID) | ORAL | 1 refills | Status: DC | PRN

## 2018-02-01 NOTE — Progress Notes (Signed)
Using super plus tampons and have soaked thru 5 since this am. Having a lot of cramping that had stopped previously but are now back today.

## 2018-02-01 NOTE — Progress Notes (Addendum)
GYNECOLOGY OFFICE VISIT NOTE   History:  48 y.o. [id number] here today for abnormal vaginal bleeding.  The bleeding started on Saturday and she is bleeding more this time than she did with the 26 days of bleeding that she had previously in December and earlier in January.  Was seen in the office on Jan. 16 and the vaginal bleeding had stopped.  She has a headache today.  Has a history of headache for 12 weeks when she had DepoProvera.  She took 800 mg of ibuprofen this morning and none since then. It did not relieve her pain.  She is wearing super-plus tampons with no pads and is bleeding through them.  She has had a few 50 cent size clots following the tampon out.  She denies any abnormal vaginal discharge or other concerns.  Usual menses are 5 days with no cramping.  She is scheduled for a pelvic ultrasound on Jan 27.  Past Medical History:  Diagnosis Date  . Anemia   . Arthritis    knee  . Back pain   . Chicken pox   . Depression 2004  . Diverticulitis   . Migraines    no longer has them. Was medication related  . Rotator cuff (capsule) sprain    right  . Seasonal allergies   . Shortness of breath dyspnea    occasionally    Past Surgical History:  Procedure Laterality Date  . APPLICATION OF A-CELL OF CHEST/ABDOMEN Bilateral 12/31/2015   Procedure: APPLICATION OF A-CELL OF BILATERAL AXILLA;  Surgeon: Coralie Keens, MD;  Location: [facility];  Service: General;  Laterality: Bilateral;  . CERVICAL DISCECTOMY  2012  . HYDRADENITIS EXCISION Bilateral 12/31/2015   Procedure: WIDE EXCISION BILATERAL AXILLARY HIDRADENITIS WITH ACELL APPLICATION;  Surgeon: Coralie Keens, MD;  Location: [facility];  Service: General;  Laterality: Bilateral;  . JOINT REPLACEMENT    . TOTAL KNEE ARTHROPLASTY Right 06/18/2015   Procedure: TOTAL KNEE ARTHROPLASTY;  Surgeon: Frederik Pear, MD;  Location: [facility];  Service: Orthopedics;  Laterality: Right;  . TUBAL LIGATION       The following portions of the patient's history were reviewed and updated as appropriate: allergies, current medications, past family history, past medical history, past social history, past surgical history and problem list.   Health Maintenance:  Normal pap on 01-01-15.  Normal mammogram on 08-04-12.   Review of Systems:  Pertinent items noted in HPI and remainder of comprehensive ROS otherwise negative.  Objective:  Physical Exam There were no vitals taken for this visit. CONSTITUTIONAL: Well-developed, well-nourished female in no acute distress.  HENT:  Normocephalic, atraumatic. External right and left ear normal.  NECK: Normal range of motion, supple, SKIN: Skin is warm and dry. No rash noted. Not diaphoretic. No erythema. No pallor. NEUROLOGIC: Alert and oriented to person, place, and time. Normal muscle tone coordination. No cranial nerve deficit noted. PSYCHIATRIC: Normal mood and affect. Normal behavior. Normal judgment and thought content. CARDIOVASCULAR: Normal heart rate noted RESPIRATORY: Effort and breath sounds normal, no problems with respiration noted MUSCULOSKELETAL: Normal range of motion. No edema noted.  Labs and Imaging Negative urine pregnancy test GC/Chlam pending  Last CBC on 01-28-18 was 11.8  (with recent 26 days of vagina bleeding so did not repeat today)  Assessment & Plan:  1. Abnormal vaginal bleeding in premenopausal patient Will prescribe Megace 80 mg PO BID and taper down when bleeding stops.  Also prescribed Provera 10 mg q day as it  might be cheaper at her pharmacy than Megace. Drink at least 8 8-oz glasses of water every day. Eat healthy meals with protein at every meal. Prescribed Ibuprofen 600 mg PO with food every 6 hours.  Take around the clock for  several days until the pain and your headache stops. Use a pad with your tampons or a pad alone.  2.  Headache Advised to get Excedrin and take by the package directions for your  headache.  Routine preventative health maintenance measures emphasized. Please refer to After Visit Summary for other counseling recommendations.   Return for 29th with MD - may need endometrial biopsy and review of [place]. - pap is also due.   Total face-to-face time with patient: 15 minutes.  Over 50% of encounter was spent on counseling and coordination of care.  Earlie Server, RN, MSN, NP-BC Nurse Practitioner, [facility] for [employer], [facility] 02/01/2018 6:30 PM

## 2018-02-01 NOTE — Addendum Note (Signed)
Addended by: Virginia Rochester on: 02/01/2018 07:53 PM   Modules accepted: Orders

## 2018-02-01 NOTE — Patient Instructions (Addendum)
Drink 64 ounces of water daily. Eat protein at every meal. Can take Excedrin by the package directions for your headache also.   Abnormal Uterine Bleeding Abnormal uterine bleeding means bleeding more than usual from your uterus. It can include:  Bleeding between periods.  Bleeding after sex.  Bleeding that is heavier than normal.  Periods that last longer than usual.  Bleeding after you have stopped having your period (menopause). There are many problems that may cause this. You should see a doctor for any kind of bleeding that is not normal. Treatment depends on the cause of the bleeding. Follow these instructions at home:  Watch your condition for any changes.  Do not use tampons, douche, or have sex, if your doctor tells you not to.  Change your pads often.  Get regular well-woman exams. Make sure they include a pelvic exam and cervical cancer screening.  Keep all follow-up visits as told by your doctor. This is important. Contact a doctor if:  The bleeding lasts more than one week.  You feel dizzy at times.  You feel like you are going to throw up (nauseous).  You throw up. Get help right away if:  You pass out.  You have to change pads every hour.  You have belly (abdominal) pain.  You have a fever.  You get sweaty.  You get weak.  You passing large blood clots from your vagina. Summary  Abnormal uterine bleeding means bleeding more than usual from your uterus.  There are many problems that may cause this. You should see a doctor for any kind of bleeding that is not normal.  Treatment depends on the cause of the bleeding. This information is not intended to replace advice given to you by your health care provider. Make sure you discuss any questions you have with your health care provider. Document Released: 10/27/2008 Document Revised: 12/25/2015 Document Reviewed: 12/25/2015 Elsevier Interactive Patient Education  2019 [employer].

## 2018-02-03 LAB — GC/CHLAMYDIA PROBE AMP (~~LOC~~) NOT AT ARMC
Chlamydia: NEGATIVE
Neisseria Gonorrhea: NEGATIVE

## 2018-02-08 ENCOUNTER — Ambulatory Visit (HOSPITAL_COMMUNITY)
Admission: RE | Admit: 2018-02-08 | Discharge: 2018-02-08 | Disposition: A | Discharge status: Home / Self Care | Payer: Self-pay | Source: Ambulatory Visit | Attending: Internal Medicine | Admitting: Internal Medicine

## 2018-02-08 DIAGNOSIS — N939 Abnormal uterine and vaginal bleeding, unspecified: Secondary | ICD-10-CM | POA: Insufficient documentation

## 2018-02-10 ENCOUNTER — Encounter: Payer: Self-pay | Admitting: Obstetrics & Gynecology

## 2018-02-10 ENCOUNTER — Ambulatory Visit (INDEPENDENT_AMBULATORY_CARE_PROVIDER_SITE_OTHER): Payer: Self-pay | Admitting: Obstetrics & Gynecology

## 2018-02-10 ENCOUNTER — Other Ambulatory Visit (HOSPITAL_COMMUNITY): Payer: Self-pay | Admitting: *Deleted

## 2018-02-10 VITALS — BP 115/77 | HR 85 | Ht 68.0 in | Wt 269.0 lb

## 2018-02-10 DIAGNOSIS — N6320 Unspecified lump in the left breast, unspecified quadrant: Secondary | ICD-10-CM

## 2018-02-10 DIAGNOSIS — Z124 Encounter for screening for malignant neoplasm of cervix: Secondary | ICD-10-CM

## 2018-02-10 DIAGNOSIS — N939 Abnormal uterine and vaginal bleeding, unspecified: Secondary | ICD-10-CM

## 2018-02-10 DIAGNOSIS — Z1151 Encounter for screening for human papillomavirus (HPV): Secondary | ICD-10-CM

## 2018-02-10 DIAGNOSIS — N951 Menopausal and female climacteric states: Secondary | ICD-10-CM

## 2018-02-10 MED ORDER — MEGESTROL ACETATE 40 MG PO TABS: 40.0000 mg | ORAL_TABLET | Freq: Every day | ORAL | 6 refills | Status: DC

## 2018-02-10 NOTE — Patient Instructions (Signed)

## 2018-02-10 NOTE — Progress Notes (Signed)
History:  48 y.o. [id number] here today for irreg cycles. Pt repots that she had monthly cycles prior Dec. In Nov pt had a reg cycles. Prev cycles length was 4-5 days  Dec 17- jan 11 On jan 17 she started her cycle again. These cycles are long and heavy.   No mood changes or hot flushes.   The following portions of the patient's history were reviewed and updated as appropriate: allergies, current medications, past family history, past medical history, past social history, past surgical history and problem list.  Review of Systems:  Pertinent items are noted in HPI.    Objective:  Physical Exam Blood pressure 115/77, pulse 85, height 5\' 8"  (1.727 m), weight 269 lb (122 kg), last menstrual period 01/29/2018.  General Appearance:    Alert, cooperative, no distress, appears stated age  Head:    Normocephalic, without obvious abnormality, atraumatic  Eyes:    conjunctiva/corneas clear, EOM's intact, both eyes  Ears:    Normal external ear canals, both ears  Nose:   Nares normal, septum midline, mucosa normal, no drainage    or sinus tenderness  Throat:   Lips, mucosa, and tongue normal; teeth and gums normal  Neck:   Supple, symmetrical, trachea midline, no adenopathy;    thyroid:  no enlargement/tenderness/nodules  Back:     Symmetric, no curvature, ROM normal, no CVA tenderness  Lungs:     Clear to auscultation bilaterally, respirations unlabored  Chest Wall:    No tenderness or deformity   Heart:    Regular rate and rhythm, S1 and S2 normal, no murmur, rub   or gallop  Breast Exam:    No tenderness, masses, or nipple abnormality; there is a small mobile mass in her left breast at the 9 o'clock position.    Abdomen:     Soft, non-tender, bowel sounds active all four quadrants,    no masses, no organomegaly  Genitalia:    Normal female without lesion, discharge or tenderness     Extremities:   Extremities normal, atraumatic, no cyanosis or edema  Pulses:   2+ and symmetric all extremities   Skin:   Skin color, texture, turgor normal, no rashes or lesions   Labs and Imaging US Pelvic Complete With Transvaginal  Result Date: 02/08/2018 CLINICAL DATA:  Blood 3.5 weeks ago, had no bleeding for 7 days, and has since started bleeding heavier than before, to Provera to stop bleeding EXAM: TRANSABDOMINAL AND TRANSVAGINAL ULTRASOUND OF PELVIS TECHNIQUE: Both transabdominal and transvaginal ultrasound examinations of the pelvis were performed. Transabdominal technique was performed for global imaging of the pelvis including uterus, ovaries, adnexal regions, and pelvic cul-de-sac. It was necessary to proceed with endovaginal exam following the transabdominal exam to visualize the uterus and endometrium. COMPARISON:  None FINDINGS: Uterus Measurements: 8.5 x 4.1 x 5.1 cm = volume: 177.8 mL. Normal morphology without mass. Small nabothian cysts at cervix. Endometrium Thickness: 2 mm.  No endometrial fluid or focal abnormality Right ovary Measurements: 1.6 x 1.0 x 1.2 cm = volume: 1.9 mL. Normal morphology without mass. Left ovary Measurements: 2.2 x 1.6 x 1.7 cm = volume: 6.0 mL. Multiple small simple cysts versus single mildly complicated cyst with a single thin septation 2.2 cm greatest size. No mural nodularity. Other findings No free pelvic fluid.  No adnexal masses. IMPRESSION: No significant pelvic sonographic abnormalities identified. Multiple small simple cysts versus single mildly complicated cyst with a thin septation in the LEFT ovary, 2.2 cm greatest aggregate size. Electronically  Signed   By: Lavonia Dana M.D.   On: 02/08/2018 16:18    Assessment & Plan:  Perimenopausal main sx is bleeding-   PAP  TSH and FSH  Megace 40mg  daily  Left breast mass  Diagnostic mammogram  Breast US on left  (above not ordered pt will be referred to [facility])  Baxter Gonzalez L. Harraway-Smith, M.D., Cherlynn June

## 2018-02-11 LAB — TSH: TSH: 1.04 u[IU]/mL (ref 0.450–4.500)

## 2018-02-11 LAB — FOLLICLE STIMULATING HORMONE: FSH: 9.2 m[IU]/mL

## 2018-02-15 LAB — CYTOLOGY - PAP
Diagnosis: NEGATIVE
HPV: NOT DETECTED

## 2018-02-18 ENCOUNTER — Ambulatory Visit
Admission: RE | Admit: 2018-02-18 | Discharge: 2018-02-18 | Disposition: A | Discharge status: Home / Self Care | Payer: No Typology Code available for payment source | Source: Ambulatory Visit | Attending: Obstetrics and Gynecology | Admitting: Obstetrics and Gynecology

## 2018-02-18 ENCOUNTER — Ambulatory Visit (HOSPITAL_COMMUNITY)
Admission: RE | Admit: 2018-02-18 | Discharge: 2018-02-18 | Disposition: A | Discharge status: Home / Self Care | Payer: Self-pay | Source: Ambulatory Visit | Attending: Obstetrics and Gynecology | Admitting: Obstetrics and Gynecology

## 2018-02-18 ENCOUNTER — Encounter (HOSPITAL_COMMUNITY): Payer: Self-pay

## 2018-02-18 VITALS — BP 134/82

## 2018-02-18 DIAGNOSIS — N6320 Unspecified lump in the left breast, unspecified quadrant: Secondary | ICD-10-CM

## 2018-02-18 DIAGNOSIS — M79621 Pain in right upper arm: Secondary | ICD-10-CM

## 2018-02-18 DIAGNOSIS — Z1239 Encounter for other screening for malignant neoplasm of breast: Secondary | ICD-10-CM

## 2018-02-18 DIAGNOSIS — N644 Mastodynia: Secondary | ICD-10-CM

## 2018-02-18 DIAGNOSIS — N6324 Unspecified lump in the left breast, lower inner quadrant: Secondary | ICD-10-CM

## 2018-02-18 NOTE — Progress Notes (Signed)
Complaints of left breast lump found on exam 02/10/2018. Complaints of pain when left breast lump is touched. Patient rates the pain at a 6-7 out of 10.  Pap Smear: Pap smear not completed today. Last Pap smear was 02/10/2018 at [facility] and normal with negative HPV. Per patient has no history of an abnormal Pap smear. Last Pap smear result is in Epic.  Physical exam: Breasts Breasts symmetrical. No skin abnormalities left breast. Observed lesions within the right axilla. Patient states she has been diagnosed with an infection within her right axilla and an antibiotic was prescribed. No nipple retraction bilateral breasts. No nipple discharge bilateral breasts. No lymphadenopathy. No lumps palpated right breast. Palpated a lump within the left breast at 8 o'clock next to nipple. Complaints of pain when palpated left breast lump and right axilla on exam. Referred patient to the [place] for a diagnostic mammogram and left breast ultrasound. Appointment scheduled for Thursdasy, February 18, 2018 at 1520.        Pelvic/Bimanual No Pap smear completed today since last Pap smear and HPV typing was 02/10/2018. Pap smear not indicated per BCCCP guidelines.   Smoking History: Patient has never smoked.  Patient Navigation: Patient education provided. Access to services provided for patient through BCCCP program.    Breast and Cervical Cancer Risk Assessment: Patient has no family history of breast cancer, known genetic mutations, or radiation treatment to the chest before age 6. Patient has no history of cervical dysplasia, immunocompromised, or DES exposure in-utero.  Risk Assessment    Risk Scores      02/18/2018   Last edited by: Armond Hang, LPN   5-year risk: 1.2 %   Lifetime risk: 9.4 %

## 2018-02-18 NOTE — Patient Instructions (Signed)
Explained breast self awareness with Krystal Mann. Patient did not need a Pap smear today due to last Pap smear and HPV typing was 02/10/2018. Let her know BCCCP will cover Pap smears and HPV typing every 5 years unless has a history of abnormal Pap smears. Referred patient to the [facility] for a diagnostic mammogram and left breast ultrasound. Appointment scheduled for Thursdasy, February 18, 2018 at 1520. Patient aware of appointment and will be there. Krystal Mann verbalized understanding.  Moritz Lever, Arvil Chaco, RN 3:52 PM

## 2018-02-19 ENCOUNTER — Encounter (HOSPITAL_COMMUNITY): Payer: Self-pay | Admitting: *Deleted

## 2018-04-05 ENCOUNTER — Emergency Department (HOSPITAL_BASED_OUTPATIENT_CLINIC_OR_DEPARTMENT_OTHER)
Admission: EM | Admit: 2018-04-05 | Discharge: 2018-04-05 | Disposition: A | Discharge status: Home / Self Care | Payer: No Typology Code available for payment source | Attending: Emergency Medicine | Admitting: Emergency Medicine

## 2018-04-05 ENCOUNTER — Encounter (HOSPITAL_BASED_OUTPATIENT_CLINIC_OR_DEPARTMENT_OTHER): Payer: Self-pay | Admitting: *Deleted

## 2018-04-05 ENCOUNTER — Other Ambulatory Visit: Payer: Self-pay

## 2018-04-05 ENCOUNTER — Emergency Department (HOSPITAL_BASED_OUTPATIENT_CLINIC_OR_DEPARTMENT_OTHER): Payer: No Typology Code available for payment source

## 2018-04-05 DIAGNOSIS — Z79899 Other long term (current) drug therapy: Secondary | ICD-10-CM | POA: Insufficient documentation

## 2018-04-05 DIAGNOSIS — Z96651 Presence of right artificial knee joint: Secondary | ICD-10-CM | POA: Insufficient documentation

## 2018-04-05 DIAGNOSIS — M25561 Pain in right knee: Secondary | ICD-10-CM | POA: Insufficient documentation

## 2018-04-05 MED ORDER — ACETAMINOPHEN 500 MG PO TABS
1000.0000 mg | ORAL_TABLET | Freq: Once | ORAL | Status: AC
  Administered 2018-04-05: 1000 mg via ORAL
  Filled 2018-04-05: qty 2

## 2018-04-05 MED ORDER — KETOROLAC TROMETHAMINE 15 MG/ML IJ SOLN
30.0000 mg | Freq: Once | INTRAMUSCULAR | Status: AC
  Administered 2018-04-05: 30 mg via INTRAMUSCULAR
  Filled 2018-04-05: qty 2

## 2018-04-05 MED ORDER — HYDROCODONE-ACETAMINOPHEN 5-325 MG PO TABS: 1.0000 | ORAL_TABLET | Freq: Three times a day (TID) | ORAL | 0 refills | Status: DC | PRN

## 2018-04-05 MED ORDER — NAPROXEN 500 MG PO TABS: 500.0000 mg | ORAL_TABLET | Freq: Two times a day (BID) | ORAL | 0 refills | Status: AC

## 2018-04-05 MED FILL — NAPROXEN 500 MG TABLET: 500 | 5 days supply | Qty: 10 | Fill #0

## 2018-04-05 MED FILL — HYDROCODON-APAP 5-325: 5-325 | 2 days supply | Qty: 6 | Fill #0

## 2018-04-05 NOTE — ED Triage Notes (Signed)
Pt c/o right knee pain x 1 week

## 2018-04-05 NOTE — ED Provider Notes (Signed)
[facility] EMERGENCY DEPARTMENT Provider Note   CSN: [id number] Arrival date & time: 04/05/18  1236    History   Chief Complaint Chief Complaint  Patient presents with  . Knee Pain    HPI Vallie Fayette Kegley is a 48 y.o. female.     HPI  Pt is a 48 y/o female with a h/o anemia, arthritis (right knee, s/p TKA), depression, diverticulitis, migraines, seasonal allergies who presents to the ED today for evaluation of right knee pain.  States she has a history of having surgery in this knee a few years ago.  Since then she has had intermittent flareups of right knee pain to medial aspect of the knee.  Usually symptoms resolve after several days however her symptoms have persisted for the last 2 weeks. Pain located to similar area that she has had flareups to in the past. He denies any known trauma, but she does note that about 2 weeks ago she had a bruise to the medial aspect of her left knee.  No increased swelling.  No redness or warmth of the knee.  No fevers at home.  No recent falls or trauma.  Pain worse with weightbearing.  Pain constant and severe in nature.  Not resolved with Motrin p.m.  Past Medical History:  Diagnosis Date  . Anemia   . Arthritis    knee  . Back pain   . Chicken pox   . Depression 2004  . Diverticulitis   . Migraines    no longer has them. Was medication related  . Rotator cuff (capsule) sprain    right  . Seasonal allergies   . Shortness of breath dyspnea    occasionally    Patient Active Problem List   Diagnosis Date Noted  . Primary osteoarthritis of right knee 06/15/2015  . Preventative health care 01/01/2015  . Family history of familial polyposis - attenuated - brother 08/31/2014  . Family history of colon cancer 08/30/2014  . Axillary abscess 06/13/2014  . Knee pain, chronic 06/13/2014  . Obesity (BMI 35.0-39.9 without comorbidity) 06/13/2014  . Eczema 06/13/2014  . Breast lump on right side at 9 o'clock position  07/27/2012  . Bilateral lower extremity edema 10/10/2011  . Encounter to establish care with new doctor 10/10/2011    Past Surgical History:  Procedure Laterality Date  . APPLICATION OF A-CELL OF CHEST/ABDOMEN Bilateral 12/31/2015   Procedure: APPLICATION OF A-CELL OF BILATERAL AXILLA;  Surgeon: Coralie Keens, MD;  Location: [facility];  Service: General;  Laterality: Bilateral;  . BREAST CYST ASPIRATION  2014  . CERVICAL DISCECTOMY  2012  . HYDRADENITIS EXCISION Bilateral 12/31/2015   Procedure: WIDE EXCISION BILATERAL AXILLARY HIDRADENITIS WITH ACELL APPLICATION;  Surgeon: Coralie Keens, MD;  Location: [facility];  Service: General;  Laterality: Bilateral;  . JOINT REPLACEMENT    . TOTAL KNEE ARTHROPLASTY Right 06/18/2015   Procedure: TOTAL KNEE ARTHROPLASTY;  Surgeon: Frederik Pear, MD;  Location: [facility];  Service: Orthopedics;  Laterality: Right;  . TUBAL LIGATION       OB History    Gravida  3   Para  2   Term  2   Preterm  0   AB  1   Living  2     SAB  1   TAB      Ectopic      Multiple      Live Births  Home Medications    Prior to Admission medications   Medication Sig Start Date End Date Taking? Authorizing Provider  ibuprofen (ADVIL,MOTRIN) 600 MG tablet Take 1 tablet (600 mg total) by mouth every 6 (six) hours as needed. 02/01/18  Yes Burleson, Terri L, NP  acetaminophen (TYLENOL) 325 MG tablet Take 650 mg by mouth. 6A, 12N, 6P, 12MN    [provider]  fluticasone (FLONASE) 50 MCG/ACT nasal spray Place 2 sprays into both nostrils daily. Patient not taking: Reported on 02/10/2018 03/08/15   Ward, Ozella Almond, PA-C  HYDROcodone-acetaminophen (NORCO/VICODIN) 5-325 MG tablet Take 1 tablet by mouth every 8 (eight) hours as needed. 04/05/18   Kirt Chew S, PA-C  megestrol (MEGACE) 40 MG tablet Take 1 tablet (40 mg total) by mouth daily. Can increase to two tablets twice a day in the event of heavy  bleeding 02/10/18   Lavonia Drafts, MD  methocarbamol (ROBAXIN) 500 MG tablet Take 500 mg by mouth 3 (three) times daily. 6A, 2P, 10P    [provider]  naproxen (NAPROSYN) 500 MG tablet Take 1 tablet (500 mg total) by mouth 2 (two) times daily for 5 days. 04/05/18 04/10/18  Jaydence Arnesen S, PA-C    Family History Family History  Problem Relation Age of Onset  . Kidney cancer Mother        malaginant cancer (kidney), skin cancer  . Diabetes Mother   . Hyperlipidemia Mother   . Skin cancer Mother   . Cancer Mother        colon  . Lung cancer Father   . Diabetes Sister   . Asthma Sister   . Colon polyps Brother 41       x 21 removed  . Liver cancer Brother 34  . Colon polyps Brother     Social History Social History   Tobacco Use  . Smoking status: Never Smoker  . Smokeless tobacco: Never Used  Substance Use Topics  . Alcohol use: Yes    Alcohol/week: 0.0 standard drinks    Comment: social  . Drug use: No     Allergies   Morphine and related; Medroxyprogesterone; and Hydrocodone   Review of Systems Review of Systems  Constitutional: Negative for fever.  Musculoskeletal:       Right knee pain  Skin: Negative for color change.       No swelling or erythema. No warmth     Physical Exam Updated Vital Signs BP (!) 155/100   Pulse 99   Temp 98.5 F (36.9 C)   Resp 18   Ht 5\' 8"  (1.727 m)   Wt 120.2 kg   LMP 04/05/2018   SpO2 100%   BMI 40.29 kg/m   Physical Exam Vitals signs and nursing note reviewed.  Constitutional:      General: She is not in acute distress.    Appearance: She is well-developed. She is not ill-appearing.  HENT:     Head: Normocephalic and atraumatic.  Eyes:     Conjunctiva/sclera: Conjunctivae normal.  Neck:     Musculoskeletal: Neck supple.  Cardiovascular:     Rate and Rhythm: Normal rate.  Pulmonary:     Effort: Pulmonary effort is normal.  Musculoskeletal: Normal range of motion.     Comments: TTP to  the anterior and medial aspect of the right knee. Faint old bruising noted to the area that is most tender on medial knee. No obvious swelling or effusion though exam limited secondary to body habitus. No erythema  or warmth noted. Somewhat limited ROM 2/2 pain. No calf TTP or RLE edema. Ambulatory with limp.  Skin:    General: Skin is warm and dry.  Neurological:     Mental Status: She is alert.    ED Treatments / Results  Labs (all labs ordered are listed, but only abnormal results are displayed) Labs Reviewed - No data to display  EKG None  Radiology Dg Knee 2 Views Right  Result Date: 04/05/2018 CLINICAL DATA:  Increasing right knee pain, initial encounter EXAM: RIGHT KNEE - 2 VIEW COMPARISON:  06/13/2014 FINDINGS: Right knee prosthesis is seen. No loosening is identified. No acute fracture or dislocation is seen. IMPRESSION: Status post right knee replacement.  No acute abnormality noted. Electronically Signed   By: Inez Catalina M.D.   On: 04/05/2018 13:13    Procedures Procedures (including critical care time)  Medications Ordered in ED Medications  acetaminophen (TYLENOL) tablet 1,000 mg (1,000 mg Oral Given 04/05/18 1311)  ketorolac (TORADOL) 15 MG/ML injection 30 mg (30 mg Intramuscular Given 04/05/18 1311)     Initial Impression / Assessment and Plan / ED Course  I have reviewed the triage vital signs and the nursing notes.  Pertinent labs & imaging results that were available during my care of the patient were reviewed by me and considered in my medical decision making (see chart for details).     Final Clinical Impressions(s) / ED Diagnoses   Final diagnoses:  Acute pain of right knee   Pt with a h/o right TKA and chronic intermittent right knee presenting with acute exacerbation of right knee pain. No known trauma or falls but states she noticed a bruise to the knee about 2 weeks ago when pain started.   TTP to the anterior/medial aspect of the right knee. Faint  old bruising noted to medial knee. No obvious swelling or effusion though exam limited secondary to body habitus. No erythema or warmth noted. Somewhat limited ROM 2/2 pain. No calf TTP or RLE edema to suggest DVT. Ambulatory with limp.   No clinical signs or sxs of septic arthritis. Xray right knee completed without evidence of fracture/dislocation or loosening of hardware.   Gave dose of tylenol and toradol in the ed and pt reports improvement of pain. Able to ambulate in the ed and ROM somewhat improved. Will give rx for antiinflammatory and short course of pain meds given. Reviewed [place] narcotic database and there are no red flags. I advised pt to contact her orthopedic doctor for f/u appt this week and to take rx as prescribed. Advised on specific return precautions. She voices understanding and is in agreement with plan. All questions answered and pt stable for d/c.   ED Discharge Orders         Ordered    naproxen (NAPROSYN) 500 MG tablet  2 times daily     04/05/18 1436    HYDROcodone-acetaminophen (NORCO/VICODIN) 5-325 MG tablet  Every 8 hours PRN     04/05/18 1436           Trevaughn Schear S, PA-C 04/05/18 1542    Little, Wenda Overland, MD 04/05/18 1907

## 2018-04-05 NOTE — Discharge Instructions (Addendum)
You may alternate taking Tylenol and Naproxen as needed for pain control. You may take Naproxen twice daily as directed on your discharge paperwork and you may take  500 mg of Tylenol every 6 hours. Do not exceed 4000 mg of Tylenol daily as this can lead to liver damage. Be aware that there is tylenol in the norco pain pill. Also, make sure to take Naproxen with meals as it can cause an upset stomach. Do not take other NSAIDs while taking Naproxen such as (Aleve, Ibuprofen, Aspirin, Celebrex, etc) and do not take more than the prescribed dose as this can lead to ulcers and bleeding in your GI tract. You may use warm and cold compresses to help with your symptoms.   Prescription given for Norco. Take medication as directed and do not operate machinery, drive a car, or work while taking this medication as it can make you drowsy.   Please call your orthopedic doctor today to make an appointment for follow up this week  Please return to the ER sooner if you have any new or worsening symptoms including uncontrolled pain, fevers, redness/swelling to the knee, warmth to the knee, or any new or worsening symptoms.

## 2018-07-30 ENCOUNTER — Telehealth: Payer: Self-pay | Admitting: Obstetrics & Gynecology

## 2018-07-30 NOTE — Telephone Encounter (Signed)
Patient was screened for COVID-19 symptoms and denied any symptoms. Instructed to use the provided hand sanitizer when entering building. Patient was instructed about wearing a mask for their entire visit, and no visitors.

## 2018-08-02 ENCOUNTER — Ambulatory Visit (INDEPENDENT_AMBULATORY_CARE_PROVIDER_SITE_OTHER): Payer: Self-pay | Admitting: Obstetrics & Gynecology

## 2018-08-02 ENCOUNTER — Encounter: Payer: Self-pay | Admitting: Obstetrics & Gynecology

## 2018-08-02 ENCOUNTER — Other Ambulatory Visit: Payer: Self-pay

## 2018-08-02 VITALS — BP 116/79 | HR 103 | Wt 266.3 lb

## 2018-08-02 DIAGNOSIS — N83202 Unspecified ovarian cyst, left side: Secondary | ICD-10-CM

## 2018-08-02 DIAGNOSIS — R5383 Other fatigue: Secondary | ICD-10-CM

## 2018-08-02 DIAGNOSIS — D500 Iron deficiency anemia secondary to blood loss (chronic): Secondary | ICD-10-CM

## 2018-08-02 DIAGNOSIS — N939 Abnormal uterine and vaginal bleeding, unspecified: Secondary | ICD-10-CM

## 2018-08-02 NOTE — Patient Instructions (Signed)
Return to clinic for any scheduled appointments or for any gynecologic concerns as needed.   

## 2018-08-02 NOTE — Progress Notes (Signed)
GYNECOLOGY OFFICE VISIT NOTE  History:   Krystal Mann is a 48 y.o. [id number] here today for discussion about management of prolonged episode of AUB. Had heavy bleeding for over a month, finally stopped three days ago and is now spotting. Associated with lightheadedness, fatigue and some episodes of dizziness. Has been evaluated in the past for this; ultrasound in 01/2018 showed no uterine anomalies, 2 mm endometrial stripe, and midlly complex left ovarian cyst.  She denies any abnormal vaginal discharge, pelvic pain or other concerns.    Past Medical History:  Diagnosis Date  . Anemia   . Arthritis    knee  . Back pain   . Chicken pox   . Depression 2004  . Diverticulitis   . Migraines    no longer has them. Was medication related  . Rotator cuff (capsule) sprain    right  . Seasonal allergies   . Shortness of breath dyspnea    occasionally    Past Surgical History:  Procedure Laterality Date  . APPLICATION OF A-CELL OF CHEST/ABDOMEN Bilateral 12/31/2015   Procedure: APPLICATION OF A-CELL OF BILATERAL AXILLA;  Surgeon: Coralie Keens, MD;  Location: [facility];  Service: General;  Laterality: Bilateral;  . BREAST CYST ASPIRATION  2014  . CERVICAL DISCECTOMY  2012  . HYDRADENITIS EXCISION Bilateral 12/31/2015   Procedure: WIDE EXCISION BILATERAL AXILLARY HIDRADENITIS WITH ACELL APPLICATION;  Surgeon: Coralie Keens, MD;  Location: [facility];  Service: General;  Laterality: Bilateral;  . JOINT REPLACEMENT    . TOTAL KNEE ARTHROPLASTY Right 06/18/2015   Procedure: TOTAL KNEE ARTHROPLASTY;  Surgeon: Frederik Pear, MD;  Location: [facility];  Service: Orthopedics;  Laterality: Right;  . TUBAL LIGATION      The following portions of the patient's history were reviewed and updated as appropriate: allergies, current medications, past family history, past medical history, past social history, past surgical history and problem list.   Health  Maintenance:  Normal pap and negative HRHPV on 02/10/2018.  Normal mammogram on 02/18/2018.   Review of Systems:  Pertinent items noted in HPI and remainder of comprehensive ROS otherwise negative.  Physical Exam:  BP 116/79   Pulse (!) 103   Wt 266 lb 4.8 oz (120.8 kg)   BMI 40.49 kg/m  CONSTITUTIONAL: Well-developed, well-nourished female in no acute distress.  HEENT:  Normocephalic, atraumatic. External right and left ear normal. No scleral icterus.  NECK: Normal range of motion, supple, no masses noted on observation SKIN: No rash noted. Not diaphoretic. No erythema. No pallor. MUSCULOSKELETAL: Normal range of motion. No edema noted. NEUROLOGIC: Alert and oriented to person, place, and time. Normal muscle tone coordination. No cranial nerve deficit noted. PSYCHIATRIC: Normal mood and affect. Normal behavior. Normal judgment and thought content. CARDIOVASCULAR: Normal heart rate noted RESPIRATORY: Effort and breath sounds normal, no problems with respiration noted ABDOMEN: No masses noted. No other overt distention noted.   PELVIC: Deferred  Labs and Imaging CBC Latest Ref Rng & Units 01/28/2018 08/13/2015 06/25/2015  WBC 3.4 - 10.8 x10E3/uL 6.0 7.2 5.9  Hemoglobin 11.1 - 15.9 g/dL 11.8 11.8(L) 10.3(A)  Hematocrit 34.0 - 46.6 % 35.2 35.9(L) 33(A)  Platelets 150 - 450 x10E3/uL 332 322.0 328       Assessment and Plan:      1. Fatigue, unspecified type Will check anemia labs and manage accordingly. - CBC - TSH - Ferritin  2. Cyst of left ovary 3. Abnormal uterine bleeding (AUB) Discussed management options for  abnormal uterine bleeding including NSAIDs (Naproxen), tranexamic acid (Lysteda), oral progesterone, Depo Provera, Levonogestrel IUD, endometrial ablation or hysterectomy as definitive surgical management.  Discussed risks and benefits of each method. Patient reported having adverse reactions to oral progestin in the past, considering IUD vs endometrial ablation.    Printed  patient education handouts were given to the patient to review at home.  Bleeding precautions reviewed.  Ultrasound and CBC ordered, will follow up results and manage accordingly. - US PELVIC COMPLETE WITH TRANSVAGINAL; Future - CBC Routine preventative health maintenance measures emphasized. Please refer to After Visit Summary for other counseling recommendations.   Return in about 2 weeks (around 08/16/2018) for Virtual Follow Up (after her ultrasound).    Total face-to-face time with patient: 25 minutes.  Over 50% of encounter was spent on counseling and coordination of care.   Verita Schneiders, MD, [place] for [employer], [place]

## 2018-08-02 NOTE — Progress Notes (Signed)
A few days shy of heavy bleeding sized of a golf ball. Stopped for a few days now spotting as of today. Only  Noticeable when wipe.

## 2018-08-03 LAB — CBC
Hematocrit: 28.8 % — ABNORMAL LOW (ref 34.0–46.6)
Hemoglobin: 8.9 g/dL — ABNORMAL LOW (ref 11.1–15.9)
MCH: 24.8 pg — ABNORMAL LOW (ref 26.6–33.0)
MCHC: 30.9 g/dL — ABNORMAL LOW (ref 31.5–35.7)
MCV: 80 fL (ref 79–97)
Platelets: 440 10*3/uL (ref 150–450)
RBC: 3.59 x10E6/uL — ABNORMAL LOW (ref 3.77–5.28)
RDW: 14.5 % (ref 11.7–15.4)
WBC: 6.6 10*3/uL (ref 3.4–10.8)

## 2018-08-03 LAB — TSH: TSH: 1.65 u[IU]/mL (ref 0.450–4.500)

## 2018-08-03 LAB — FERRITIN: Ferritin: 4 ng/mL — ABNORMAL LOW (ref 15–150)

## 2018-08-05 DIAGNOSIS — D500 Iron deficiency anemia secondary to blood loss (chronic): Secondary | ICD-10-CM | POA: Insufficient documentation

## 2018-08-05 NOTE — Addendum Note (Signed)
Addended by: Verita Schneiders A on: 08/05/2018 04:26 PM   Modules accepted: Orders

## 2018-08-09 ENCOUNTER — Telehealth: Payer: Self-pay | Admitting: General Practice

## 2018-08-09 NOTE — Telephone Encounter (Signed)
Per Dr Harolyn Rutherford, Iron deficiency anemia. Please arrange for Feraheme infusion for patient X 2 doses. If she is symptomatic, offer transfusion. Please call to inform patient of results and recommendations.   Called patient and informed her of CBC results. Asked how she was feeling and patient reports feeling very tired/fatigued, runs out of energy just walking across the room and has had some dizzy spells. Called Blood Bank who states blood products are limited right now to 1 unit given her hemoglobin levels. Discussed with Dr Harolyn Rutherford who states patient should receive 1 unit of blood and 2 units of feraheme.  Called patient back & discussed with her. Told patient the schedulers for that are gone for the day but I will call her back tomorrow. Patient verbalized understanding

## 2018-08-10 ENCOUNTER — Other Ambulatory Visit: Payer: Self-pay | Admitting: Obstetrics & Gynecology

## 2018-08-10 NOTE — Telephone Encounter (Signed)
Scheduled blood transfusion and 1st dose of feraheme 8/7 @ 8am. Patient will also need to be at short stay 8/6 @ 245 for type and cross.   Called patient and informed her of both appts. Patient verbalized understanding and asked if her family could donate blood for her so she could get 2 units of blood instead of the iron. Told patient I wasn't sure about that but her family would need to be tested and would also have to have the same blood type. Told patient doing that would likely delay treatment. Reassured patient that 1 unit of blood and 2 doses of iron would be sufficient. Reassured her, her iron levels are low but not concerning low so current plan of care is fine especially now that her bleeding has stopped. Patient verbalized understanding and had no questions.

## 2018-08-18 ENCOUNTER — Other Ambulatory Visit: Payer: Self-pay

## 2018-08-18 ENCOUNTER — Ambulatory Visit (HOSPITAL_COMMUNITY)
Admission: RE | Admit: 2018-08-18 | Discharge: 2018-08-18 | Disposition: A | Discharge status: Home / Self Care | Payer: Self-pay | Source: Ambulatory Visit | Attending: Obstetrics & Gynecology | Admitting: Obstetrics & Gynecology

## 2018-08-18 DIAGNOSIS — N939 Abnormal uterine and vaginal bleeding, unspecified: Secondary | ICD-10-CM | POA: Insufficient documentation

## 2018-08-18 DIAGNOSIS — N83202 Unspecified ovarian cyst, left side: Secondary | ICD-10-CM | POA: Insufficient documentation

## 2018-08-19 ENCOUNTER — Other Ambulatory Visit: Payer: Self-pay

## 2018-08-19 ENCOUNTER — Ambulatory Visit (HOSPITAL_COMMUNITY)
Admission: RE | Admit: 2018-08-19 | Discharge: 2018-08-19 | Disposition: A | Discharge status: Home / Self Care | Payer: No Typology Code available for payment source | Source: Ambulatory Visit | Attending: Obstetrics & Gynecology | Admitting: Obstetrics & Gynecology

## 2018-08-19 ENCOUNTER — Encounter: Payer: Self-pay | Admitting: Obstetrics & Gynecology

## 2018-08-19 DIAGNOSIS — N939 Abnormal uterine and vaginal bleeding, unspecified: Secondary | ICD-10-CM | POA: Insufficient documentation

## 2018-08-19 DIAGNOSIS — N8000 Endometriosis of the uterus, unspecified: Secondary | ICD-10-CM | POA: Insufficient documentation

## 2018-08-19 DIAGNOSIS — N8003 Adenomyosis of the uterus: Secondary | ICD-10-CM | POA: Insufficient documentation

## 2018-08-19 DIAGNOSIS — D649 Anemia, unspecified: Secondary | ICD-10-CM | POA: Insufficient documentation

## 2018-08-19 LAB — PREPARE RBC (CROSSMATCH)

## 2018-08-20 ENCOUNTER — Telehealth: Payer: Self-pay

## 2018-08-20 ENCOUNTER — Encounter (HOSPITAL_COMMUNITY)
Admission: RE | Admit: 2018-08-20 | Discharge: 2018-08-20 | Disposition: A | Discharge status: Home / Self Care | Payer: Self-pay | Source: Ambulatory Visit | Attending: Obstetrics & Gynecology | Admitting: Obstetrics & Gynecology

## 2018-08-20 DIAGNOSIS — D500 Iron deficiency anemia secondary to blood loss (chronic): Secondary | ICD-10-CM | POA: Insufficient documentation

## 2018-08-20 MED ORDER — SODIUM CHLORIDE 0.9 % IV SOLN
510.0000 mg | INTRAVENOUS | Status: DC
  Administered 2018-08-20: 510 mg via INTRAVENOUS
  Filled 2018-08-20: qty 17

## 2018-08-20 MED ORDER — SODIUM CHLORIDE 0.9% IV SOLUTION: Freq: Once | INTRAVENOUS | Status: DC

## 2018-08-20 MED ORDER — DIPHENHYDRAMINE HCL 25 MG PO CAPS
ORAL_CAPSULE | ORAL | Status: AC
  Filled 2018-08-20: qty 1

## 2018-08-20 MED ORDER — ACETAMINOPHEN 500 MG PO TABS
ORAL_TABLET | ORAL | Status: AC
  Filled 2018-08-20: qty 2

## 2018-08-20 MED ORDER — DIPHENHYDRAMINE HCL 25 MG PO CAPS
25.0000 mg | ORAL_CAPSULE | Freq: Once | ORAL | Status: AC
  Administered 2018-08-20: 25 mg via ORAL

## 2018-08-20 MED ORDER — ACETAMINOPHEN 500 MG PO TABS
1000.0000 mg | ORAL_TABLET | Freq: Once | ORAL | Status: AC
  Administered 2018-08-20: 1000 mg via ORAL

## 2018-08-20 NOTE — Telephone Encounter (Addendum)
-----   Message from Osborne Oman, MD sent at 08/19/2018  5:54 PM EDT ----- Uterus showed possible adenomyosis, a benign condition that can cause abnormal bleeding.  Please call to inform patient of results and recommendations.  Will discuss further during her virtual visit on 08/30/18.  Notified pt provider's recommendation and that Dr. Harolyn Rutherford will discuss management at her virtual appt on 08/30/18.  Pt verbalized understanding with no further questions.

## 2018-08-21 LAB — TYPE AND SCREEN
ABO/RH(D): O POS
Antibody Screen: NEGATIVE
Unit division: 0

## 2018-08-21 LAB — BPAM RBC
Blood Product Expiration Date: 202008292359
ISSUE DATE / TIME: 202008070909
Unit Type and Rh: 5100

## 2018-08-27 ENCOUNTER — Other Ambulatory Visit: Payer: Self-pay

## 2018-08-27 ENCOUNTER — Encounter (HOSPITAL_COMMUNITY)
Admission: RE | Admit: 2018-08-27 | Discharge: 2018-08-27 | Disposition: A | Discharge status: Home / Self Care | Payer: Self-pay | Source: Ambulatory Visit | Attending: Obstetrics & Gynecology | Admitting: Obstetrics & Gynecology

## 2018-08-27 DIAGNOSIS — D500 Iron deficiency anemia secondary to blood loss (chronic): Secondary | ICD-10-CM

## 2018-08-27 MED ORDER — SODIUM CHLORIDE 0.9 % IV SOLN
510.0000 mg | INTRAVENOUS | Status: AC
  Administered 2018-08-27: 510 mg via INTRAVENOUS
  Filled 2018-08-27: qty 17

## 2018-08-30 ENCOUNTER — Encounter: Payer: Self-pay | Admitting: Obstetrics & Gynecology

## 2018-08-30 ENCOUNTER — Other Ambulatory Visit: Payer: Self-pay

## 2018-08-30 ENCOUNTER — Telehealth (INDEPENDENT_AMBULATORY_CARE_PROVIDER_SITE_OTHER): Payer: Self-pay | Admitting: Obstetrics & Gynecology

## 2018-08-30 DIAGNOSIS — N8000 Endometriosis of the uterus, unspecified: Secondary | ICD-10-CM

## 2018-08-30 DIAGNOSIS — N939 Abnormal uterine and vaginal bleeding, unspecified: Secondary | ICD-10-CM

## 2018-08-30 NOTE — Progress Notes (Signed)
TELEHEALTH GYNECOLOGY VIRTUAL VIDEO VISIT ENCOUNTER NOTE  Provider location: [facility] for [employer] at [facility]   I connected with Krystal Mann on 08/30/18 at 10:15 AM EDT by MyChart Video Encounter at home and verified that I am speaking with the correct person using two identifiers.   I discussed the limitations, risks, security and privacy concerns of performing an evaluation and management service virtually and the availability of in person appointments. I also discussed with the patient that there may be a patient responsible charge related to this service. The patient expressed understanding and agreed to proceed.   History:  Krystal Mann is a 48 y.o. [id number] female being followed up today after being evaluated and treated for AUB and associated anemia.  She received transfusion of one unit of pRBCs and two doses of Feraheme.  She feels significantly better, no current bleeding.   She denies any abnormal vaginal discharge, bleeding, pelvic pain or other concerns.      Past Medical History:  Diagnosis Date  . Anemia   . Arthritis    knee  . Back pain   . Chicken pox   . Depression 2004  . Diverticulitis   . Migraines    no longer has them. Was medication related  . Rotator cuff (capsule) sprain    right  . Seasonal allergies   . Shortness of breath dyspnea    occasionally   Past Surgical History:  Procedure Laterality Date  . APPLICATION OF A-CELL OF CHEST/ABDOMEN Bilateral 12/31/2015   Procedure: APPLICATION OF A-CELL OF BILATERAL AXILLA;  Surgeon: Coralie Keens, MD;  Location: [place];  Service: General;  Laterality: Bilateral;  . BREAST CYST ASPIRATION  2014  . CERVICAL DISCECTOMY  2012  . HYDRADENITIS EXCISION Bilateral 12/31/2015   Procedure: WIDE EXCISION BILATERAL AXILLARY HIDRADENITIS WITH ACELL APPLICATION;  Surgeon: Coralie Keens, MD;  Location: [place];  Service: General;  Laterality:  Bilateral;  . JOINT REPLACEMENT    . TOTAL KNEE ARTHROPLASTY Right 06/18/2015   Procedure: TOTAL KNEE ARTHROPLASTY;  Surgeon: Frederik Pear, MD;  Location: [place];  Service: Orthopedics;  Laterality: Right;  . TUBAL LIGATION     The following portions of the patient's history were reviewed and updated as appropriate: allergies, current medications, past family history, past medical history, past social history, past surgical history and problem list.   Health Maintenance:   Normal pap and negative HRHPV on 02/10/2018.  Normal mammogram on 02/18/2018.   Review of Systems:  Pertinent items noted in HPI and remainder of comprehensive ROS otherwise negative.  Physical Exam:   General:  Alert, oriented and cooperative. Patient appears to be in no acute distress.  Mental Status: Normal mood and affect. Normal behavior. Normal judgment and thought content.   Respiratory: Normal respiratory effort, no problems with respiration noted  Rest of physical exam deferred due to type of encounter  Labs and Imaging Results for orders placed or performed during the hospital encounter of 08/19/18 (from the past 336 hour(s))  Type and screen   Collection Time: 08/19/18  2:50 PM  Result Value Ref Range   ABO/RH(D) O POS    Antibody Screen NEG    Sample Expiration 08/22/2018,2359    Unit Number [id number]    Blood Component Type RED CELLS,LR    Unit division 00    Status of Unit ISSUED,FINAL    Transfusion Status OK TO TRANSFUSE    Crossmatch Result  Compatible Performed at [facility], [place] [address], [place], [place] [address]   [facility]   Collection Time: 08/19/18  2:50 PM  Result Value Ref Range   Order Confirmation      ORDER PROCESSED BY BLOOD BANK Performed at [facility], [place] [address] [place], [place] [address]   [facility]   Collection Time: 08/19/18  2:50 PM  Result Value Ref Range   ISSUE DATE / TIME [id number]    Blood Product Unit Number [id number]     PRODUCT CODE [id number]    Unit Type and Rh 5100    Blood Product Expiration Date 416606301601    US Pelvic Complete With Transvaginal  Result Date: 08/18/2018 CLINICAL DATA:  Abnormal uterine bleeding, LEFT ovarian cyst EXAM: TRANSABDOMINAL AND TRANSVAGINAL ULTRASOUND OF PELVIS TECHNIQUE: Both transabdominal and transvaginal ultrasound examinations of the pelvis were performed. Transabdominal technique was performed for global imaging of the pelvis including uterus, ovaries, adnexal regions, and pelvic cul-de-sac. It was necessary to proceed with endovaginal exam following the transabdominal exam to visualize the endometrium and ovaries. COMPARISON:  02/08/2018 FINDINGS: Uterus Measurements: 8.4 x 3.8 x 5.1 cm = volume: 85 mL. Heterogeneous myometrial echogenicity, especially at the upper uterine segment anteriorly. Poor definition of the basal layer of the endometrial complex. Several tiny myometrial cysts. Findings are suspicious for adenomyosis. Few small nabothian cysts at cervix. Endometrium Thickness: 3 mm.  No endometrial fluid or focal abnormality Right ovary Measurements: 2.5 x 1.6 x 1.5 cm = volume: 3.0 mL. Dominant follicle without mass Left ovary Not visualized on either transabdominal or endovaginal imaging, likely obscured by bowel Other findings No free pelvic fluid.  No adnexal masses. IMPRESSION: Nonvisualization of LEFT ovary. Heterogeneous myometrial echogenicity with ill-defined endometrial complex and several tiny myometrial cysts, suspicious for adenomyosis of the uterus. Electronically Signed   By: Lavonia Dana M.D.   On: 08/18/2018 12:50       Assessment and Plan:     1. Abnormal uterine bleeding (AUB) 2. Uterus, adenomyosis Discussed ultrasound findings of adenomyosis, all questions answered. Predominant symptom for patient is AUB.  After our discussion about management modalities, she desires progestin IUD. Discussed IUD insertion details, risks reviewed but also discussed  benefits of this modality. The other alternative she was considering was endometrial ablation; discussed details of this modality too.  Patient will return soon for IUD insertion, premedication with Ibuprofen recommended.    I discussed the assessment and treatment plan with the patient. The patient was provided an opportunity to ask questions and all were answered. The patient agreed with the plan and demonstrated an understanding of the instructions.   The patient was advised to call back or seek an in-person evaluation/go to the ED if the symptoms worsen or if the condition fails to improve as anticipated.  I provided 15 minutes of face-to-face time during this encounter.   Verita Schneiders, MD [facility] for [employer] [facility], [place]

## 2018-08-30 NOTE — Patient Instructions (Signed)
Please take Ibuprofen prior to next appointment  Intrauterine Device Insertion An intrauterine device (IUD) is a medical device that gets inserted into the uterus to prevent pregnancy. It is a small, T-shaped device that has one or two nylon strings hanging down from it. The strings hang out of the lower part of the uterus (cervix) to allow for future IUD removal.   Hormone IUD. This type of IUD is made of plastic and contains the hormone progestin (synthetic progesterone). The hormone thickens mucus in the cervix and prevents sperm from entering the uterus. It also thins the uterine lining to prevent implantation of a fertilized egg. The hormone can weaken or kill the sperm that get into the uterus. A hormone IUD may last 5-7 years. Tell a health care provider about:  Any allergies you have.  All medicines you are taking, including vitamins, herbs, eye drops, creams, and over-the-counter medicines.  Any problems you or family members have had with anesthetic medicines.  Any blood disorders you have.  Any surgeries you have had.  Any medical conditions you have, including any STIs (sexually transmitted infections) you may have.  Whether you are pregnant or may be pregnant. What are the risks? Generally, this is a safe procedure. However, problems may occur, including:  Infection.  Bleeding.  Allergic reactions to medicines.  Accidental puncture (perforation) of the uterus, or damage to other structures or organs.  Accidental placement of the IUD either in the muscle layer of the uterus (myometrium) or outside the uterus.  The IUD falling out of the uterus (expulsion). This is more common among women who have recently had a child.  Pregnancy that happens in the fallopian tube (ectopic pregnancy).  Infection of the uterus and fallopian tubes (pelvic inflammatory disease). What happens before the procedure?  Schedule the IUD insertion for when you will have your menstrual period  or right after, to make sure you are not pregnant. Placement of the IUD is better tolerated shortly after a menstrual cycle.  Follow instructions from your health care provider about eating or drinking restrictions.  Ask your health care provider about changing or stopping your regular medicines. This is especially important if you are taking diabetes medicines or blood thinners.  You may get a pain reliever to take before the procedure.  You may have tests for: ? Pregnancy. A pregnancy test involves having a urine sample taken. ? STIs. Placing an IUD in someone who has an STI can make the infection worse. ? Cervical cancer. You may have a Pap test to check for this type of cancer. This means collecting cells from your cervix to be examined under a microscope.  You may have a physical exam to determine the size and position of your uterus. The procedure may vary among health care providers and hospitals. What happens during the procedure?  A tool (speculum) will be placed in your vagina and widened so that your health care provider can see your cervix.  Medicine may be applied to your cervix to help lower your risk of infection (antiseptic medicine).  You may be given an anesthetic medicine to numb each side of your cervix (intracervical block or paracervical block). This medicine is usually given by an injection into the cervix.  A tool (uterine sound) will be inserted into your uterus to determine the length of your uterus and the direction that your uterus may be tilted.  A slim instrument or tube (IUD inserter) that holds the IUD will be inserted into  your vagina, through your cervical canal, and into your uterus.  The IUD will be placed in the uterus, and the IUD inserter will be removed.  The strings that are attached to the IUD will be trimmed so that they lie just below the cervix. The procedure may vary among health care providers and hospitals. What happens after the  procedure?  You may have bleeding after the procedure. This is normal. It varies from light bleeding (spotting) for a few days to menstrual-like bleeding.  You may have cramping and pain.  You may feel dizzy or light-headed.  You may have lower back pain. Summary  An intrauterine device (IUD) is a small, T-shaped device that has one or two nylon strings hanging down from it.  Two types of IUDs are available. You may have a copper IUD or a hormone IUD.  Schedule the IUD insertion for when you will have your menstrual period or right after, to make sure you are not pregnant. Placement of the IUD is better tolerated shortly after a menstrual cycle.  You may have bleeding after the procedure. This is normal. It varies from light spotting for a few days to menstrual-like bleeding. This information is not intended to replace advice given to you by your health care provider. Make sure you discuss any questions you have with your health care provider. Document Released: 08/28/2010 Document Revised: 12/12/2016 Document Reviewed: 11/21/2015 Elsevier Patient Education  2020 [employer].    Endometrial Ablation Endometrial ablation is a procedure that destroys the thin inner layer of the lining of the uterus (endometrium). This procedure may be done: To stop heavy periods. To stop bleeding that is causing anemia. To control irregular bleeding. To treat bleeding caused by small tumors (fibroids) in the endometrium. This procedure is often an alternative to major surgery, such as removal of the uterus and cervix (hysterectomy). As a result of this procedure: You may not be able to have children. However, if you are premenopausal (you have not gone through menopause): You may still have a small chance of getting pregnant. You will need to use a reliable method of birth control after the procedure to prevent pregnancy. You may stop having a menstrual period, or you may have only a small amount of  bleeding during your period. Menstruation may return several years after the procedure. Tell a health care provider about: Any allergies you have. All medicines you are taking, including vitamins, herbs, eye drops, creams, and over-the-counter medicines. Any problems you or family members have had with the use of anesthetic medicines. Any blood disorders you have. Any surgeries you have had. Any medical conditions you have. What are the risks? Generally, this is a safe procedure. However, problems may occur, including: A hole (perforation) in the uterus or bowel. Infection of the uterus, bladder, or vagina. Bleeding. Damage to other structures or organs. An air bubble in the lung (air embolus). Problems with pregnancy after the procedure. Failure of the procedure. Decreased ability to diagnose cancer in the endometrium. What happens before the procedure? You will have tests of your endometrium to make sure there are no pre-cancerous cells or cancer cells present. You may have an ultrasound of the uterus. You may be given medicines to thin the endometrium. Ask your health care provider about: Changing or stopping your regular medicines. This is especially important if you take diabetes medicines or blood thinners. Taking medicines such as aspirin and ibuprofen. These medicines can thin your blood. Do not take these  medicines before your procedure if your doctor tells you not to. Plan to have someone take you home from the hospital or clinic. What happens during the procedure?  You will lie on an exam table with your feet and legs supported as in a pelvic exam. To lower your risk of infection: Your health care team will wash or sanitize their hands and put on germ-free (sterile) gloves. Your genital area will be washed with soap. An IV tube will be inserted into one of your veins. You will be given a medicine to help you relax (sedative). A surgical instrument with a light and camera  (resectoscope) will be inserted into your vagina and moved into your uterus. This allows your surgeon to see inside your uterus. Endometrial tissue will be removed using one of the following methods: Radiofrequency. This method uses a radiofrequency-alternating electric current to remove the endometrium. Cryotherapy. This method uses extreme cold to freeze the endometrium. Heated-free liquid. This method uses a heated saltwater (saline) solution to remove the endometrium. Microwave. This method uses high-energy microwaves to heat up the endometrium and remove it. Thermal balloon. This method involves inserting a catheter with a balloon tip into the uterus. The balloon tip is filled with heated fluid to remove the endometrium. The procedure may vary among health care providers and hospitals. What happens after the procedure? Your blood pressure, heart rate, breathing rate, and blood oxygen level will be monitored until the medicines you were given have worn off. As tissue healing occurs, you may notice vaginal bleeding for 4-6 weeks after the procedure. You may also experience: Cramps. Thin, watery vaginal discharge that is light pink or brown in color. A need to urinate more frequently than usual. Nausea. Do not drive for 24 hours if you were given a sedative. Do not have sex or insert anything into your vagina until your health care provider approves. Summary Endometrial ablation is done to treat the many causes of heavy menstrual bleeding. The procedure may be done only after medications have been tried to control the bleeding. Plan to have someone take you home from the hospital or clinic. This information is not intended to replace advice given to you by your health care provider. Make sure you discuss any questions you have with your health care provider. Document Released: 11/09/2003 Document Revised: 06/16/2017 Document Reviewed: 01/17/2016 Elsevier Patient Education  2020 [employer].

## 2018-09-14 ENCOUNTER — Encounter: Payer: Self-pay | Admitting: Women's Health

## 2018-09-14 ENCOUNTER — Ambulatory Visit: Payer: No Typology Code available for payment source | Admitting: Women's Health

## 2018-09-14 ENCOUNTER — Other Ambulatory Visit: Payer: Self-pay

## 2018-09-14 NOTE — Patient Instructions (Signed)
Levonorgestrel intrauterine device (IUD) What is this medicine? LEVONORGESTREL IUD (LEE voe nor jes trel) is a contraceptive (birth control) device. The device is placed inside the uterus by a healthcare professional. It is used to prevent pregnancy. This device can also be used to treat heavy bleeding that occurs during your period. This medicine may be used for other purposes; ask your health care provider or pharmacist if you have questions. COMMON BRAND NAME(S): Kyleena, LILETTA, Mirena, Skyla What should I tell my health care provider before I take this medicine? They need to know if you have any of these conditions:  abnormal Pap smear  cancer of the breast, uterus, or cervix  diabetes  endometritis  genital or pelvic infection now or in the past  have more than one sexual partner or your partner has more than one partner  heart disease  history of an ectopic or tubal pregnancy  immune system problems  IUD in place  liver disease or tumor  problems with blood clots or take blood-thinners  seizures  use intravenous drugs  uterus of unusual shape  vaginal bleeding that has not been explained  an unusual or allergic reaction to levonorgestrel, other hormones, silicone, or polyethylene, medicines, foods, dyes, or preservatives  pregnant or trying to get pregnant  breast-feeding How should I use this medicine? This device is placed inside the uterus by a health care professional. Talk to your pediatrician regarding the use of this medicine in children. Special care may be needed. Overdosage: If you think you have taken too much of this medicine contact a poison control center or emergency room at once. NOTE: This medicine is only for you. Do not share this medicine with others. What if I miss a dose? This does not apply. Depending on the brand of device you have inserted, the device will need to be replaced every 3 to 6 years if you wish to continue using this type  of birth control. What may interact with this medicine? Do not take this medicine with any of the following medications:  amprenavir  bosentan  fosamprenavir This medicine may also interact with the following medications:  aprepitant  armodafinil  barbiturate medicines for inducing sleep or treating seizures  bexarotene  boceprevir  griseofulvin  medicines to treat seizures like carbamazepine, ethotoin, felbamate, oxcarbazepine, phenytoin, topiramate  modafinil  pioglitazone  rifabutin  rifampin  rifapentine  some medicines to treat HIV infection like atazanavir, efavirenz, indinavir, lopinavir, nelfinavir, tipranavir, ritonavir  St. John's wort  warfarin This list may not describe all possible interactions. Give your health care provider a list of all the medicines, herbs, non-prescription drugs, or dietary supplements you use. Also tell them if you smoke, drink alcohol, or use illegal drugs. Some items may interact with your medicine. What should I watch for while using this medicine? Visit your doctor or health care professional for regular check ups. See your doctor if you or your partner has sexual contact with others, becomes HIV positive, or gets a sexual transmitted disease. This product does not protect you against HIV infection (AIDS) or other sexually transmitted diseases. You can check the placement of the IUD yourself by reaching up to the top of your vagina with clean fingers to feel the threads. Do not pull on the threads. It is a good habit to check placement after each menstrual period. Call your doctor right away if you feel more of the IUD than just the threads or if you cannot feel the threads at   all. The IUD may come out by itself. You may become pregnant if the device comes out. If you notice that the IUD has come out use a backup birth control method like condoms and call your health care provider. Using tampons will not change the position of the  IUD and are okay to use during your period. This IUD can be safely scanned with magnetic resonance imaging (MRI) only under specific conditions. Before you have an MRI, tell your healthcare provider that you have an IUD in place, and which type of IUD you have in place. What side effects may I notice from receiving this medicine? Side effects that you should report to your doctor or health care professional as soon as possible:  allergic reactions like skin rash, itching or hives, swelling of the face, lips, or tongue  fever, flu-like symptoms  genital sores  high blood pressure  no menstrual period for 6 weeks during use  pain, swelling, warmth in the leg  pelvic pain or tenderness  severe or sudden headache  signs of pregnancy  stomach cramping  sudden shortness of breath  trouble with balance, talking, or walking  unusual vaginal bleeding, discharge  yellowing of the eyes or skin Side effects that usually do not require medical attention (report to your doctor or health care professional if they continue or are bothersome):  acne  breast pain  change in sex drive or performance  changes in weight  cramping, dizziness, or faintness while the device is being inserted  headache  irregular menstrual bleeding within first 3 to 6 months of use  nausea This list may not describe all possible side effects. Call your doctor for medical advice about side effects. You may report side effects to FDA at 1-800-FDA-1088. Where should I keep my medicine? This does not apply. NOTE: This sheet is a summary. It may not cover all possible information. If you have questions about this medicine, talk to your doctor, pharmacist, or health care provider.  2020 Elsevier/Gold Standard (2017-11-10 13:22:01) Intrauterine Device Insertion An intrauterine device (IUD) is a medical device that gets inserted into the uterus to prevent pregnancy. It is a small, T-shaped device that has one  or two nylon strings hanging down from it. The strings hang out of the lower part of the uterus (cervix) to allow for future IUD removal. There are two types of IUDs available:  Copper IUD. This type of IUD has copper wire wrapped around it. Copper makes the uterus and fallopian tubes produce a fluid that kills sperm. A copper IUD may last up to 10 years.  Hormone IUD. This type of IUD is made of plastic and contains the hormone progestin (synthetic progesterone). The hormone thickens mucus in the cervix and prevents sperm from entering the uterus. It also thins the uterine lining to prevent implantation of a fertilized egg. The hormone can weaken or kill the sperm that get into the uterus. A hormone IUD may last 3-5 years. Tell a health care provider about:  Any allergies you have.  All medicines you are taking, including vitamins, herbs, eye drops, creams, and over-the-counter medicines.  Any problems you or family members have had with anesthetic medicines.  Any blood disorders you have.  Any surgeries you have had.  Any medical conditions you have, including any STIs (sexually transmitted infections) you may have.  Whether you are pregnant or may be pregnant. What are the risks? Generally, this is a safe procedure. However, problems may occur, including:  Infection.  Bleeding.  Allergic reactions to medicines.  Accidental puncture (perforation) of the uterus, or damage to other structures or organs.  Accidental placement of the IUD either in the muscle layer of the uterus (myometrium) or outside the uterus.  The IUD falling out of the uterus (expulsion). This is more common among women who have recently had a child.  Pregnancy that happens in the fallopian tube (ectopic pregnancy).  Infection of the uterus and fallopian tubes (pelvic inflammatory disease). What happens before the procedure?  Schedule the IUD insertion for when you will have your menstrual period or right  after, to make sure you are not pregnant. Placement of the IUD is better tolerated shortly after a menstrual cycle.  Follow instructions from your health care provider about eating or drinking restrictions.  Ask your health care provider about changing or stopping your regular medicines. This is especially important if you are taking diabetes medicines or blood thinners.  You may get a pain reliever to take before the procedure.  You may have tests for: ? Pregnancy. A pregnancy test involves having a urine sample taken. ? STIs. Placing an IUD in someone who has an STI can make the infection worse. ? Cervical cancer. You may have a Pap test to check for this type of cancer. This means collecting cells from your cervix to be examined under a microscope.  You may have a physical exam to determine the size and position of your uterus. The procedure may vary among health care providers and hospitals. What happens during the procedure?  A tool (speculum) will be placed in your vagina and widened so that your health care provider can see your cervix.  Medicine may be applied to your cervix to help lower your risk of infection (antiseptic medicine).  You may be given an anesthetic medicine to numb each side of your cervix (intracervical block or paracervical block). This medicine is usually given by an injection into the cervix.  A tool (uterine sound) will be inserted into your uterus to determine the length of your uterus and the direction that your uterus may be tilted.  A slim instrument or tube (IUD inserter) that holds the IUD will be inserted into your vagina, through your cervical canal, and into your uterus.  The IUD will be placed in the uterus, and the IUD inserter will be removed.  The strings that are attached to the IUD will be trimmed so that they lie just below the cervix. The procedure may vary among health care providers and hospitals. What happens after the procedure?  You  may have bleeding after the procedure. This is normal. It varies from light bleeding (spotting) for a few days to menstrual-like bleeding.  You may have cramping and pain.  You may feel dizzy or light-headed.  You may have lower back pain. Summary  An intrauterine device (IUD) is a small, T-shaped device that has one or two nylon strings hanging down from it.  Two types of IUDs are available. You may have a copper IUD or a hormone IUD.  Schedule the IUD insertion for when you will have your menstrual period or right after, to make sure you are not pregnant. Placement of the IUD is better tolerated shortly after a menstrual cycle.  You may have bleeding after the procedure. This is normal. It varies from light spotting for a few days to menstrual-like bleeding. This information is not intended to replace advice given to you by your health care  provider. Make sure you discuss any questions you have with your health care provider. Document Released: 08/28/2010 Document Revised: 12/12/2016 Document Reviewed: 11/21/2015 Elsevier Patient Education  2020 [employer]. Intrauterine Device Information An intrauterine device (IUD) is a medical device that is inserted in the uterus to prevent pregnancy. It is a small, T-shaped device that has one or two nylon strings hanging down from it. The strings hang out of the lower part of the uterus (cervix) to allow for future IUD removal. There are two types of IUDs available:  Hormone IUD. This type of IUD is made of plastic and contains the hormone progestin (synthetic progesterone). A hormone IUD may last 3-5 years.  Copper IUD. This type of IUD has copper wire wrapped around it. A copper IUD may last up to 10 years. How is an IUD inserted? An IUD is inserted through the vagina and placed into the uterus with a minor medical procedure. The exact procedure for IUD insertion may vary among health care providers and hospitals. How does an IUD work?  Synthetic progesterone in a hormonal IUD prevents pregnancy by:  Thickening cervical mucus to prevent sperm from entering the uterus.  Thinning the uterine lining to prevent a fertilized egg from being implanted there. Copper in a copper IUD prevents pregnancy by making the uterus and fallopian tubes produce a fluid that kills sperm. What are the advantages of an IUD? Advantages of either type of IUD  It is highly effective in preventing pregnancy.  It is reversible. You can become pregnant shortly after the IUD is removed.  It is low-maintenance and can stay in place for a long time.  There are no estrogen-related side effects.  It can be used when breastfeeding.  It is not associated with weight gain.  It can be inserted right after childbirth, an abortion, or a miscarriage. Advantages of a hormone IUD  If it is inserted within 7 days of your period starting, it works right after it is inserted. If the hormone IUD is inserted at any other time in your cycle, you will need to use a backup method of birth control for 7 days after insertion.  It can make menstrual periods lighter.  It can reduce menstrual cramping.  It can be used for 3-5 years. Advantages of a copper IUD  It works right after it is inserted.  It can be used as a form of emergency birth control if it is inserted within 5 days after having unprotected sex.  It does not interfere with your body's natural hormones.  It can be used for 10 years. What are the disadvantages of an IUD?  An IUD may cause irregular menstrual bleeding for a period of time after insertion.  You may have pain during insertion and have cramping and vaginal bleeding after insertion.  An IUD may cut the uterus (uterine perforation) when it is inserted. This is rare.  An IUD may cause pelvic inflammatory disease (PID), which is an infection in the uterus and fallopian tubes. This is rare, and it usually happens during the first 20  days after the IUD is inserted.  A copper IUD can make your menstrual flow heavier and more painful. How is an IUD removed?  You will lie on your back with your knees bent and your feet in footrests (stirrups).  A device will be inserted into your vagina to spread apart the vaginal walls (speculum). This will allow your health care provider to see the strings attached to  the IUD.  Your health care provider will use a small instrument (forceps) to grasp the IUD strings and pull firmly until the IUD is removed. You may have some discomfort when the IUD is removed. Your health care provider may recommend taking over-the-counter pain relievers, such as ibuprofen, before the procedure. You may also have minor spotting for a few days after the procedure. The exact procedure for IUD removal may vary among health care providers and hospitals. Is the IUD right for me? Your health care provider will make sure you are a good candidate for an IUD and will discuss the advantages, disadvantages, and possible side effects with you. Summary  An intrauterine device (IUD) is a medical device that is inserted in the uterus to prevent pregnancy. It is a small, T-shaped device that has one or two nylon strings hanging down from it.  A hormone IUD contains the hormone progestin (synthetic progesterone). A copper IUD has copper wire wrapped around it.  Synthetic progesterone in a hormone IUD prevents pregnancy by thickening cervical mucus and thinning the walls of the uterus. Copper in a copper IUD prevents pregnancy by making the uterus and fallopian tubes produce a fluid that kills sperm.  A hormone IUD can be left in place for 3-5 years. A copper IUD can be left in place for up to 10 years.  An IUD is inserted and removed by a health care provider. You may feel some pain during insertion and removal. Your health care provider may recommend taking over-the-counter pain medicine, such as ibuprofen, before an IUD  procedure. This information is not intended to replace advice given to you by your health care provider. Make sure you discuss any questions you have with your health care provider. Document Released: 12/04/2003 Document Revised: 12/12/2016 Document Reviewed: 01/29/2016 Elsevier Patient Education  2020 [employer].

## 2018-09-14 NOTE — Progress Notes (Signed)
Pt came for IUD insertion today but was unaware that the appointment was not scheduled with Dr. Harolyn Rutherford. Pt offered to proceed with IUD insertion today or reschedule with Dr. Harolyn Rutherford, and patient elects to reschedule. Questions answered about IUD/insertion procedure and pt reminded to take ibuprofen prior to insertion.  Clarisa Fling, NP  4:59 PM 09/14/2018

## 2018-09-21 ENCOUNTER — Ambulatory Visit: Payer: Self-pay | Admitting: Student

## 2018-09-24 ENCOUNTER — Other Ambulatory Visit: Payer: Self-pay

## 2018-09-24 ENCOUNTER — Ambulatory Visit (INDEPENDENT_AMBULATORY_CARE_PROVIDER_SITE_OTHER): Payer: Self-pay | Admitting: Obstetrics & Gynecology

## 2018-09-24 ENCOUNTER — Encounter: Payer: Self-pay | Admitting: Obstetrics & Gynecology

## 2018-09-24 VITALS — BP 119/89 | HR 106 | Wt 252.0 lb

## 2018-09-24 DIAGNOSIS — Z30430 Encounter for insertion of intrauterine contraceptive device: Secondary | ICD-10-CM

## 2018-09-24 DIAGNOSIS — N939 Abnormal uterine and vaginal bleeding, unspecified: Secondary | ICD-10-CM

## 2018-09-24 MED ORDER — ACETAMINOPHEN 325 MG PO TABS
650.0000 mg | ORAL_TABLET | Freq: Once | ORAL | Status: AC
  Administered 2018-09-24: 10:00:00 650 mg via ORAL

## 2018-09-24 MED ORDER — LEVONORGESTREL 19.5 MCG/DAY IU IUD
INTRAUTERINE_SYSTEM | Freq: Once | INTRAUTERINE | Status: AC
  Administered 2018-09-24: 1 via INTRAUTERINE

## 2018-09-24 NOTE — Patient Instructions (Signed)
Intrauterine Device Insertion, Care After  This sheet gives you information about how to care for yourself after your procedure. Your health care provider may also give you more specific instructions. If you have problems or questions, contact your health care provider. What can I expect after the procedure? After the procedure, it is common to have:  Cramps and pain in the abdomen.  Light bleeding (spotting) or heavier bleeding that is like your menstrual period. This may last for up to a few days.  Lower back pain.  Dizziness.  Headaches.  Nausea. Follow these instructions at home:  Before resuming sexual activity, check to make sure that you can feel the IUD string(s). You should be able to feel the end of the string(s) below the opening of your cervix. If your IUD string is in place, you may resume sexual activity. ? If you had a hormonal IUD inserted more than 7 days after your most recent period started, you will need to use a backup method of birth control for 7 days after IUD insertion. Ask your health care provider whether this applies to you.  Continue to check that the IUD is still in place by feeling for the string(s) after every menstrual period, or once a month.  Take over-the-counter and prescription medicines only as told by your health care provider.  Do not drive or use heavy machinery while taking prescription pain medicine.  Keep all follow-up visits as told by your health care provider. This is important. Contact a health care provider if:  You have bleeding that is heavier or lasts longer than a normal menstrual cycle.  You have a fever.  You have cramps or abdominal pain that get worse or do not get better with medicine.  You develop abdominal pain that is new or is not in the same area of earlier cramping and pain.  You feel lightheaded or weak.  You have abnormal or bad-smelling discharge from your vagina.  You have pain during sexual activity.   You have any of the following problems with your IUD string(s): ? The string bothers or hurts you or your sexual partner. ? You cannot feel the string. ? The string has gotten longer.  You can feel the IUD in your vagina.  You think you may be pregnant, or you miss your menstrual period.  You think you may have an STI (sexually transmitted infection). Get help right away if:  You have flu-like symptoms.  You have a fever and chills.  You can feel that your IUD has slipped out of place. Summary  After the procedure, it is common to have cramps and pain in the abdomen. It is also common to have light bleeding (spotting) or heavier bleeding that is like your menstrual period.  Continue to check that the IUD is still in place by feeling for the string(s) after every menstrual period, or once a month.  Keep all follow-up visits as told by your health care provider. This is important.  Contact your health care provider if you have problems with your IUD string(s), such as the string getting longer or bothering you or your sexual partner. This information is not intended to replace advice given to you by your health care provider. Make sure you discuss any questions you have with your health care provider. Document Released: 08/28/2010 Document Revised: 12/12/2016 Document Reviewed: 11/21/2015 Elsevier Patient Education  2020 [employer] Inc[employer]  

## 2018-09-24 NOTE — Progress Notes (Signed)
    GYNECOLOGY OFFICE PROCEDURE NOTE  Krystal Mann is a 48 y.o. [id number] here for [place] IUD insertion for management of AUB. No other GYN concerns.  Last pap smear was on 02/01/2018 and was normal.  IUD Insertion Procedure Note Patient identified, informed consent performed, consent signed.   Discussed risks of irregular bleeding, cramping, infection, malpositioning or misplacement of the IUD outside the uterus which may require further procedure such as laparoscopy.  Time out was performed.  Urine pregnancy test not done as patient already had sterilization procedure.  Speculum placed in the vagina.  Cervix visualized.  Cleaned with Betadine x 2.  Grasped anteriorly with a single tooth tenaculum.  Uterus sounded to 8 cm.  Liletta IUD placed per manufacturer's recommendations.  Strings trimmed to 3 cm. Tenaculum was removed, good hemostasis noted.  Patient tolerated procedure well.   Patient was given post-procedure instructions.   Patient was also asked to check IUD strings periodically and follow up in 4 weeks for IUD check.   Verita Schneiders, MD, [place] for [facility], [place]

## 2018-10-08 ENCOUNTER — Ambulatory Visit: Payer: Self-pay | Admitting: Obstetrics & Gynecology

## 2018-10-22 ENCOUNTER — Ambulatory Visit (INDEPENDENT_AMBULATORY_CARE_PROVIDER_SITE_OTHER): Payer: Self-pay | Admitting: Obstetrics & Gynecology

## 2018-10-22 ENCOUNTER — Encounter: Payer: Self-pay | Admitting: Obstetrics & Gynecology

## 2018-10-22 ENCOUNTER — Other Ambulatory Visit: Payer: Self-pay

## 2018-10-22 VITALS — BP 109/72 | HR 91 | Wt 272.9 lb

## 2018-10-22 DIAGNOSIS — Z30431 Encounter for routine checking of intrauterine contraceptive device: Secondary | ICD-10-CM

## 2018-10-22 NOTE — Progress Notes (Signed)
jj   GYNECOLOGY OFFICE ENCOUNTER NOTE  History:  48 y.o. [id number] here today for today for IUD string check; Liletta IUD was placed  09/24/2018 for management of AUB. She reports reduced overall bleeding. No other complaints about the IUD, no concerning side effects.  The following portions of the patient's history were reviewed and updated as appropriate: allergies, current medications, past family history, past medical history, past social history, past surgical history and problem list. Last pap smear on 02/10/18 was normal, negative HRHPV. Normal mammogram on 02/18/2018.  Review of Systems:  Pertinent items are noted in HPI.   Objective:  Physical Exam Blood pressure 109/72, pulse 91, weight 272 lb 14.4 oz (123.8 kg), last menstrual period 10/15/2018. CONSTITUTIONAL: Well-developed, well-nourished female in no acute distress.  HENT:  Normocephalic, atraumatic. External right and left ear normal. Oropharynx is clear and moist EYES: Conjunctivae and EOM are normal. Pupils are equal, round, and reactive to light. No scleral icterus.  NECK: Normal range of motion, supple, no masses CARDIOVASCULAR: Normal heart rate noted RESPIRATORY: Effort and breath sounds normal, no problems with respiration noted ABDOMEN: Soft, no distention noted.   PELVIC: Normal appearing external genitalia; normal appearing vaginal mucosa and cervix.  IUD strings visualized, about 3 cm in length outside cervix.   Assessment & Plan:  Patient to keep IUD in place for up to five - seven years for AUB management; can come in for removal earlier if desires or for any concerning side effects.   Verita Schneiders, MD, [place] for [facility], [place]

## 2018-10-22 NOTE — Patient Instructions (Signed)
Return to clinic for any scheduled appointments or for any gynecologic concerns as needed.   

## 2020-08-02 ENCOUNTER — Encounter: Payer: Self-pay | Admitting: Primary Care

## 2020-08-02 ENCOUNTER — Ambulatory Visit (INDEPENDENT_AMBULATORY_CARE_PROVIDER_SITE_OTHER): Payer: BC Managed Care – PPO | Admitting: Primary Care

## 2020-08-02 VITALS — BP 132/82 | HR 86 | Temp 97.5°F | Ht 68.0 in | Wt 299.0 lb

## 2020-08-02 DIAGNOSIS — N939 Abnormal uterine and vaginal bleeding, unspecified: Secondary | ICD-10-CM

## 2020-08-02 DIAGNOSIS — M25562 Pain in left knee: Secondary | ICD-10-CM

## 2020-08-02 DIAGNOSIS — M25561 Pain in right knee: Secondary | ICD-10-CM

## 2020-08-02 DIAGNOSIS — R42 Dizziness and giddiness: Secondary | ICD-10-CM | POA: Diagnosis not present

## 2020-08-02 DIAGNOSIS — G8929 Other chronic pain: Secondary | ICD-10-CM

## 2020-08-02 LAB — COMPREHENSIVE METABOLIC PANEL
ALT: 11 U/L (ref 0–35)
AST: 15 U/L (ref 0–37)
Albumin: 4.1 g/dL (ref 3.5–5.2)
Alkaline Phosphatase: 109 U/L (ref 39–117)
BUN: 7 mg/dL (ref 6–23)
CO2: 27 mEq/L (ref 19–32)
Calcium: 9.1 mg/dL (ref 8.4–10.5)
Chloride: 103 mEq/L (ref 96–112)
Creatinine, Ser: 0.78 mg/dL (ref 0.40–1.20)
GFR: 87.65 mL/min (ref >60.00)
Glucose, Bld: 112 mg/dL — ABNORMAL HIGH (ref 70–99)
Potassium: 3.9 mEq/L (ref 3.5–5.1)
Sodium: 138 mEq/L (ref 135–145)
Total Bilirubin: 0.7 mg/dL (ref 0.2–1.2)
Total Protein: 7.7 g/dL (ref 6.0–8.3)

## 2020-08-02 LAB — CBC
HCT: 38.8 % (ref 36.0–46.0)
Hemoglobin: 12.7 g/dL (ref 12.0–15.0)
MCHC: 32.8 g/dL (ref 30.0–36.0)
MCV: 86.7 fl (ref 78.0–100.0)
Platelets: 321 10*3/uL (ref 150.0–400.0)
RBC: 4.48 Mil/uL (ref 3.87–5.11)
RDW: 15.2 % (ref 11.5–15.5)
WBC: 4.7 10*3/uL (ref 4.0–10.5)

## 2020-08-02 MED ORDER — FLUTICASONE PROPIONATE 50 MCG/ACT NA SUSP: 2.0000 | Freq: Every day | NASAL | 0 refills | Status: DC

## 2020-08-02 MED ORDER — MECLIZINE HCL 25 MG PO TABS: 12.5000 mg | ORAL_TABLET | Freq: Three times a day (TID) | ORAL | 0 refills | Status: DC | PRN

## 2020-08-02 NOTE — Assessment & Plan Note (Signed)
Acute for the last 2 to 3 months, progressing.  Symptoms representative of vertigo.  Negative orthostatic vital signs today. Checking CBC, CMP.  Prescription provided for Flonase and meclizine to use as needed.  Drowsiness precautions provided for meclizine.  We will plan to see her back soon for follow-up.

## 2020-08-02 NOTE — Patient Instructions (Signed)
Allergies and Dizziness: Try using Flonase (fluticasone) nasal spray. Instill 1 spray in each nostril twice daily.   You may take Meclizine 25 mg three times daily for dizziness. Start with 1/2 tablet as it may cause dizziness.   Stop by the lab prior to leaving today. I will notify you of your results once received.   It was a pleasure to see you today!

## 2020-08-02 NOTE — Assessment & Plan Note (Signed)
Chronic to bilateral knees, is barely able to walk today, leans over walker when walking. Agree to provide handicap placard.  Will complete further work up at next visit.

## 2020-08-02 NOTE — Progress Notes (Signed)
Subjective:    Patient ID: Krystal Mann, female    DOB: 01-10-1971, 50 y.o.   MRN: [id number]  HPI  [name] is a very pleasant 50 y.o. female with a history of abnormal uterine bleeding, bilateral lower extremity edema, morbid obesity who presents today to re-establish care and to discuss dizziness.  She has not been seen by me since 2017. Following with GYN, had IUD placed in 2020 due to abnormal uterine bleeding. Since IUD placement her bleeding has been well controlled.   Her dizziness began 2-3 months which occurs mostly just after laying down in bed. As soon as she lays in bed or turns in bed from one side to the other she will notice symptoms. Symptoms feel as though the room is spinning lasting a few seconds to minutes. She's noticed this while sitting at her desk at work, but symptoms were less intense.   Symptoms occur at least nightly at minium, have progressed since. She's not taken taken anything OTC for symptoms. She denies chest pain, visual changes. She does have a history of migraines which have been chronic for years, recently returned. Sometimes occur after symptoms.   She would also like a handicap placard due to chronic knee pain, has to use a walker or a cane in order to walk. She has to stop and rest with walking short distances.   Review of Systems  Respiratory:  Negative for shortness of breath.   Cardiovascular:  Negative for chest pain.  Musculoskeletal:  Positive for arthralgias.       Chronic knee pain  Neurological:  Positive for dizziness and headaches.        Past Medical History:  Diagnosis Date   Anemia    Arthritis    knee   Back pain    Chicken pox    Depression 2004   Diverticulitis    Migraines    no longer has them. Was medication related   Rotator cuff (capsule) sprain    right   Seasonal allergies    Shortness of breath dyspnea    occasionally    Social History   Socioeconomic History   Marital status:  Divorced    Spouse name: Not on file   Number of children: 2   Years of education: Not on file   Highest education level: Some college, no degree  Occupational History   Occupation: student bus driver   Occupation: customer service -road side assistance  Tobacco Use   Smoking status: Never   Smokeless tobacco: Never  Vaping Use   Vaping Use: Never used  Substance and Sexual Activity   Alcohol use: Yes    Alcohol/week: 0.0 standard drinks    Comment: social   Drug use: No   Sexual activity: Yes    Partners: Male    Birth control/protection: Surgical    Comment: tubial ligation  Other Topics Concern   Not on file  Social History Narrative   Single.   2 Children, 1 grandson.   Works as a Teacher, early years/pre.   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   Social Determinants of Health   Financial Resource Strain: Not on file  Food Insecurity: Not on file  Transportation Needs: Not on file  Physical Activity: Not on file  Stress: Not on file  Social Connections: Not on file  Intimate Partner Violence: Not on file    Past Surgical History:  Procedure Laterality Date   APPLICATION  OF A-CELL OF CHEST/ABDOMEN Bilateral 12/31/2015   Procedure: APPLICATION OF A-CELL OF BILATERAL AXILLA;  Surgeon: Coralie Keens, MD;  Location: [facility];  Service: General;  Laterality: Bilateral;   BREAST CYST ASPIRATION  2014   CERVICAL DISCECTOMY  2012   HYDRADENITIS EXCISION Bilateral 12/31/2015   Procedure: WIDE EXCISION BILATERAL AXILLARY HIDRADENITIS WITH ACELL APPLICATION;  Surgeon: Coralie Keens, MD;  Location: [facility];  Service: General;  Laterality: Bilateral;   JOINT REPLACEMENT     TOTAL KNEE ARTHROPLASTY Right 06/18/2015   Procedure: TOTAL KNEE ARTHROPLASTY;  Surgeon: Frederik Pear, MD;  Location: [facility];  Service: Orthopedics;  Laterality: Right;   TUBAL LIGATION      Family History  Problem Relation Age of Onset   Kidney  cancer Mother        malaginant cancer (kidney), skin cancer   Diabetes Mother    Hyperlipidemia Mother    Skin cancer Mother    Cancer Mother        colon   Lung cancer Father    Diabetes Sister    Asthma Sister    Colon polyps Brother 63       x 21 removed   Liver cancer Brother 73   Colon polyps Brother     Allergies  Allergen Reactions   Morphine And Related Itching and Nausea And Vomiting   Medroxyprogesterone     Gave her migraine for 3 months   Hydrocodone Itching    Current Outpatient Medications on File Prior to Visit  Medication Sig Dispense Refill   acetaminophen (TYLENOL) 325 MG tablet Take 650 mg by mouth. 6A, 12N, 6P, 12MN     ibuprofen (ADVIL,MOTRIN) 600 MG tablet Take 1 tablet (600 mg total) by mouth every 6 (six) hours as needed. 30 tablet 1   No current facility-administered medications on file prior to visit.    BP 132/82   Pulse 86   Temp (!) 97.5 F (36.4 C) (Temporal)   Ht 5\' 8"  (1.727 m)   Wt 299 lb (135.6 kg)   SpO2 98%   BMI 45.46 kg/m  Objective:   Physical Exam HENT:     Right Ear: Tympanic membrane and ear canal normal.     Left Ear: Tympanic membrane and ear canal normal.  Cardiovascular:     Rate and Rhythm: Normal rate and regular rhythm.  Pulmonary:     Effort: Pulmonary effort is normal.     Breath sounds: Normal breath sounds.  Musculoskeletal:     Cervical back: Neck supple.     Comments: Unable to ambulate erectly in clinic, leans over walker to walk. She ambulates very slowly.   Skin:    General: Skin is warm and dry.  Neurological:     Mental Status: She is oriented to person, place, and time.          Assessment & Plan:      This visit occurred during the SARS-CoV-2 public health emergency.  Safety protocols were in place, including screening questions prior to the visit, additional usage of staff PPE, and extensive cleaning of exam room while observing appropriate contact time as indicated for disinfecting  solutions.

## 2020-08-02 NOTE — Assessment & Plan Note (Signed)
Improved with IUD that was placed per GYN in 2020. CBC pending.

## 2020-08-03 ENCOUNTER — Other Ambulatory Visit (INDEPENDENT_AMBULATORY_CARE_PROVIDER_SITE_OTHER): Payer: BC Managed Care – PPO

## 2020-08-03 DIAGNOSIS — R7309 Other abnormal glucose: Secondary | ICD-10-CM | POA: Diagnosis not present

## 2020-08-03 LAB — HEMOGLOBIN A1C: Hgb A1c MFr Bld: 6 % (ref 4.6–6.5)

## 2020-09-06 ENCOUNTER — Other Ambulatory Visit: Payer: Self-pay

## 2020-09-06 ENCOUNTER — Encounter: Payer: Self-pay | Admitting: Primary Care

## 2020-09-06 ENCOUNTER — Ambulatory Visit
Admission: RE | Admit: 2020-09-06 | Discharge: 2020-09-06 | Disposition: A | Discharge status: Home / Self Care | Payer: BC Managed Care – PPO | Source: Ambulatory Visit | Attending: Primary Care | Admitting: Primary Care

## 2020-09-06 ENCOUNTER — Ambulatory Visit (INDEPENDENT_AMBULATORY_CARE_PROVIDER_SITE_OTHER): Payer: BC Managed Care – PPO | Admitting: Primary Care

## 2020-09-06 VITALS — BP 124/72 | HR 96 | Temp 98.6°F | Ht 68.0 in | Wt 294.0 lb

## 2020-09-06 DIAGNOSIS — R296 Repeated falls: Secondary | ICD-10-CM | POA: Diagnosis not present

## 2020-09-06 DIAGNOSIS — R42 Dizziness and giddiness: Secondary | ICD-10-CM

## 2020-09-06 DIAGNOSIS — Z1231 Encounter for screening mammogram for malignant neoplasm of breast: Secondary | ICD-10-CM

## 2020-09-06 DIAGNOSIS — Z1211 Encounter for screening for malignant neoplasm of colon: Secondary | ICD-10-CM

## 2020-09-06 DIAGNOSIS — R7303 Prediabetes: Secondary | ICD-10-CM | POA: Diagnosis not present

## 2020-09-06 DIAGNOSIS — G8929 Other chronic pain: Secondary | ICD-10-CM

## 2020-09-06 DIAGNOSIS — M25561 Pain in right knee: Secondary | ICD-10-CM

## 2020-09-06 DIAGNOSIS — Z96651 Presence of right artificial knee joint: Secondary | ICD-10-CM

## 2020-09-06 DIAGNOSIS — R519 Headache, unspecified: Secondary | ICD-10-CM

## 2020-09-06 DIAGNOSIS — Z8371 Family history of colonic polyps: Secondary | ICD-10-CM

## 2020-09-06 MED ORDER — MELOXICAM 15 MG PO TABS: 15.0000 mg | ORAL_TABLET | Freq: Every day | ORAL | 0 refills | Status: DC | PRN

## 2020-09-06 NOTE — Assessment & Plan Note (Signed)
No trauma. Suspect increased pain secondary weight gain and sedentary lifestyle.  Checking plain films of the right knee. Referral placed for PT.  Start meloxicam 15 mg once daily for pain. Discussed use of Tylenol.   Consider orthopedic evaluation if no improvement. Encouraged weight loss.

## 2020-09-06 NOTE — Assessment & Plan Note (Signed)
Occurring several times weekly. Migraines infrequent but do occur.  Discussed options. She will be starting Meloxicam daily for knee pain so we will start with this. She will update if no improvement.  Consider Topamax.

## 2020-09-06 NOTE — Progress Notes (Signed)
Subjective:    Patient ID: Krystal Mann, female    DOB: [birth year], 50 y.o.   MRN: [id number]  HPI  Krystal Mann is a very pleasant 50 y.o. female with a history of primary osteoarthritis of the knees, AUB, morbid obesity, iron deficiency anemia who presents today to discuss knee and hip pain.  She is also due for colonoscopy and mammogram.   1) Chronic Knee Pain:  Chronic right knee pain for years, history of right total knee replacement in 2017, also cervical discectomy in 2012. Pain is constant, worse with weightbearing activity. Her pain began to bother her during the beginning of the pandemic in early 2020. She admits to being very sedentary over the last two years due to Covid-19 pandemic.   She also endorses left hip pain as she's had to alter her gait and shift her wait to the left side due to right knee pain. She denies injury trauma to knee and hip, numbness/tingling. She does notice radiation of her right knee pain to her right anterior thigh.   She now walks with a walker and has been doing so for the last two years. She's fallen a few times over the year. She's applying topical analgesics OTC, using sports tape to support the knee, taking Ibuprofen with temporary improvement. She's been taking her sister's Tramadol and Meloxicam with improvement.   2) Frequent headaches/Migraines:  Chronic headaches for years which typically occur to the left lateral neck with radiation to left temporal and parietal regions. She was once receiving "shots" to her neck for pain. History of cervical microdiscectomy in 2012. Headaches occur three times weekly on average, lasting several hours to all day. She is not taking anything for headaches.   Her headaches do sometimes do "turn into migraines". Infrequent. Sensitivity to light and nausea with headaches and migraines. She underwent MRI brain in 2017 which was negative.   BP Readings from Last 3 Encounters:  09/06/20 124/72   08/02/20 132/82  10/22/18 109/72   Wt Readings from Last 3 Encounters:  09/06/20 294 lb (133.4 kg)  08/02/20 299 lb (135.6 kg)  10/22/18 272 lb 14.4 oz (123.8 kg)      Review of Systems  Musculoskeletal:  Positive for arthralgias. Negative for joint swelling.       See HPI  Skin:  Negative for color change.  Neurological:  Positive for weakness. Negative for numbness.        Past Medical History:  Diagnosis Date   Anemia    Arthritis    knee   Axillary abscess 06/13/2014   Back pain    Breast lump on right side at 9 o'clock position 07/27/2012   Referred patient to the [facility] for diagnostic mammogram and right breast ultrasound. Appointment scheduled for Wednesday, August 04, 2012 at 1510.    Chicken pox    Depression 2004   Diverticulitis    Migraines    no longer has them. Was medication related   Rotator cuff (capsule) sprain    right   Seasonal allergies    Shortness of breath dyspnea    occasionally    Social History   Socioeconomic History   Marital status: Divorced    Spouse name: Not on file   Number of children: 2   Years of education: Not on file   Highest education level: Some college, no degree  Occupational History   Occupation: student bus driver   Occupation: customer service -road side assistance  Tobacco Use   Smoking status: Never   Smokeless tobacco: Never  Vaping Use   Vaping Use: Never used  Substance and Sexual Activity   Alcohol use: Yes    Alcohol/week: 0.0 standard drinks    Comment: social   Drug use: No   Sexual activity: Yes    Partners: Male    Birth control/protection: Surgical    Comment: tubial ligation  Other Topics Concern   Not on file  Social History Narrative   Single.   2 Children, 1 grandson.   Works as a Teacher, early years/pre.   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   Social Determinants of Health   Financial Resource Strain: Not on file  Food Insecurity: Not  on file  Transportation Needs: Not on file  Physical Activity: Not on file  Stress: Not on file  Social Connections: Not on file  Intimate Partner Violence: Not on file    Past Surgical History:  Procedure Laterality Date   APPLICATION OF A-CELL OF CHEST/ABDOMEN Bilateral 12/31/2015   Procedure: APPLICATION OF A-CELL OF BILATERAL AXILLA;  Surgeon: Coralie Keens, MD;  Location: [facility];  Service: General;  Laterality: Bilateral;   BREAST CYST ASPIRATION  2014   CERVICAL DISCECTOMY  2012   HYDRADENITIS EXCISION Bilateral 12/31/2015   Procedure: WIDE EXCISION BILATERAL AXILLARY HIDRADENITIS WITH ACELL APPLICATION;  Surgeon: Coralie Keens, MD;  Location: [facility];  Service: General;  Laterality: Bilateral;   JOINT REPLACEMENT     TOTAL KNEE ARTHROPLASTY Right 06/18/2015   Procedure: TOTAL KNEE ARTHROPLASTY;  Surgeon: Frederik Pear, MD;  Location: [facility];  Service: Orthopedics;  Laterality: Right;   TUBAL LIGATION      Family History  Problem Relation Age of Onset   Kidney cancer Mother        malaginant cancer (kidney), skin cancer   Diabetes Mother    Hyperlipidemia Mother    Skin cancer Mother    Cancer Mother        colon   Lung cancer Father    Diabetes Sister    Asthma Sister    Colon polyps Brother 17       x 21 removed   Liver cancer Brother 27   Colon polyps Brother     Allergies  Allergen Reactions   Morphine And Related Itching and Nausea And Vomiting   Medroxyprogesterone     Gave her migraine for 3 months   Hydrocodone Itching    Current Outpatient Medications on File Prior to Visit  Medication Sig Dispense Refill   acetaminophen (TYLENOL) 325 MG tablet Take 650 mg by mouth. 6A, 12N, 6P, 12MN     fluticasone (FLONASE) 50 MCG/ACT nasal spray Place 2 sprays into both nostrils daily. 16 g 0   meclizine (ANTIVERT) 25 MG tablet Take 0.5-1 tablets (12.5-25 mg total) by mouth 3 (three) times daily as needed for dizziness. 30  tablet 0   No current facility-administered medications on file prior to visit.    BP 124/72   Pulse 96   Temp 98.6 F (37 C) (Temporal)   Ht '5\' 8"'$  (1.727 m)   SpO2 98%   BMI 45.46 kg/m  Objective:   Physical Exam Cardiovascular:     Rate and Rhythm: Normal rate and regular rhythm.  Pulmonary:     Effort: Pulmonary effort is normal.     Breath sounds: Normal breath sounds.  Musculoskeletal:     Cervical back: Neck supple.  Legs:     Comments: Decrease in ROM to right knee with flexion and extension. No obvious patellar swelling.   Skin:    General: Skin is warm and dry.  Psychiatric:        Mood and Affect: Mood normal.          Assessment & Plan:      This visit occurred during the SARS-CoV-2 public health emergency.  Safety protocols were in place, including screening questions prior to the visit, additional usage of staff PPE, and extensive cleaning of exam room while observing appropriate contact time as indicated for disinfecting solutions.

## 2020-09-06 NOTE — Assessment & Plan Note (Signed)
Recent A1C of 6.0, long history of prediabetes.  Discussed the importance of a healthy diet and regular exercise in order for weight loss, and to reduce the risk of further co-morbidity.  Continue to monitor.

## 2020-09-06 NOTE — Assessment & Plan Note (Signed)
Overdue for repeat colonoscopy, referral placed to GI.

## 2020-09-06 NOTE — Patient Instructions (Signed)
Go to [facility] to complete your knee xray.  Start Meloxicam 15 mg once daily for knee pain. Consider adding Tylenol 500 mg along with meloxicam for pain.  You will be contacted regarding your referral to physical therapy and for the colonoscopy.  Please let us know if you have not been contacted within two weeks.   Please update me regarding the migraines.   [facility]   It was a pleasure to see you today!

## 2020-09-06 NOTE — Assessment & Plan Note (Addendum)
Improved. Lab work up negative. Continue to monitor.

## 2021-11-28 ENCOUNTER — Telehealth: Payer: Self-pay | Admitting: Primary Care

## 2021-11-28 NOTE — Telephone Encounter (Signed)
Per appt notes pt already has appt to see Red Christians FNP on 11/29/21 at 8:20. Sending note to Red Christians FNP and [facility].

## 2021-11-28 NOTE — Telephone Encounter (Signed)
Noted  

## 2021-11-28 NOTE — Telephone Encounter (Signed)
[facility] - Client TELEPHONE ADVICE RECORD [facility] Patient Name: Krystal Mann Gender: Female DOB: [birth year] Age: 51 Y 2 M 16 D Return Phone Number: [phone] (Primary) Address: City/ State/ Zip: [place]  [address] Client [facility] - Client Client Site [place] - Day Provider Alma Friendly - NP Contact Type Call Who Is Calling Patient / Member / Family / Caregiver Call Type Triage / Clinical Relationship To Patient Self Return Phone Number [phone] (Primary) Chief Complaint Abdominal Pain Reason for Call Symptomatic / Request for [place] states she is having nausea, diarrhea, and abdominal pain. Translation No Nurse Assessment Nurse: Hassell Done, RN, Melanie Date/Time (Eastern Time): 11/28/2021 2:42:11 PM Confirm and document reason for call. If symptomatic, describe symptoms. ---Caller states she has abdominal pain that is coming and going she also has diarrhea and nausea. She has been sick since last Monday. Tried to go back to work a few days ago and had to come home . This is the 10th day she has been sick. Does the patient have any new or worsening symptoms? ---Yes Will a triage be completed? ---Yes Related visit to physician within the last 2 weeks? ---No Does the PT have any chronic conditions? (i.e. diabetes, asthma, this includes High risk factors for pregnancy, etc.) ---Yes List chronic conditions. ---diverticulitis Is the patient pregnant or possibly pregnant? (Ask all females between the ages of 5-55) ---No Is this a behavioral health or substance abuse call? ---No Guidelines Guideline Title Affirmed Question Affirmed Notes Nurse Date/Time (Eastern Time) Diarrhea [1] MODERATE diarrhea (e.g., 4-6 times / day more than normal) AND [2] present > 48 hours (2 days) Hassell Done, RN, Threasa Beards 11/28/2021 2:46:15 PM PLEASE  NOTE: All timestamps contained within this report are represented as [place] Standard Time. CONFIDENTIALTY NOTICE: This fax transmission is intended only for the addressee. It contains information that is legally privileged, confidential or otherwise protected from use or disclosure. If you are not the intended recipient, you are strictly prohibited from reviewing, disclosing, copying using or disseminating any of this information or taking any action in reliance on or regarding this information. If you have received this fax in error, please notify us immediately by telephone so that we can arrange for its return to [place]. Phone: [phone], Toll-Free: [phone], Fax: [phone] Page: 2 of 2 Call Id: [id number] [place]. Time Eilene Ghazi Time) Disposition Final User 11/28/2021 2:53:03 PM See PCP within 24 Hours Yes Hassell Done RN, [place] Final Disposition 11/28/2021 2:53:03 PM See PCP within 24 Hours Yes Hassell Done, RN, Threasa Beards Caller Disagree/Comply Comply Caller Understands Yes PreDisposition Call Doctor Care Advice Given Per Guideline SEE PCP WITHIN 24 HOURS: * IF OFFICE WILL BE OPEN: You need to be examined within the next 24 hours. Call your doctor (or NP/PA) when the office opens and make an appointment. * SPORTS DRINKS: You can also drink half-strength sports drinks (e.g., Gatorade, Powerade) to help treat and prevent dehydration. Mix the sports drink half and half with water. FLUID THERAPY DURING MILD TO MODERATE DIARRHEA: * AVOID caffeinated beverages. Reason: Caffeine is mildly dehydrating. * AVOID alcohol beverages (e.g., beer, wine, hard liquor). * AVOID carbonated soft drinks (soda) as these can make your diarrhea worse. * Begin with boiled starches / cereals (e.g., potatoes, rice, noodles, wheat, oats) with a small amount of salt to taste. * You can also eat bananas, yogurt, crackers, and soup. FOOD AND NUTRITION DURING MILD TO MODERATE DIARRHEA: DIARRHEA  MEDICINE - LOPERAMIDE (IMODIUM AD):  * This medicine helps decrease diarrhea. It is available over-the-counter (OTC) in a drugstore. DIARRHEA MEDICINE - LOPERAMIDE - EXTRA NOTES AND WARNINGS: * DO NOT use if there is a fever over 100.4 F (38.0 C) or if there is blood or mucus in the stools. * DO NOT drink tonic water. It can interact with loperamide and may cause a serious heart problem. * DO NOT take more than 8 mg per day (4 capsules) each day, or for longer than 2 days, unless told to do this by your doctor (or NP/PA). [place] YOUR HANDS: * Wash your hands after using the bathroom. * Wash your hands before fixing or eating food. CALL BACK IF: * Signs of dehydration occur (e.g., no urine over 12 hours, very dry mouth, lightheaded, etc.) * Bloody stools * Constant or severe abdomen pain * You become worse CARE ADVICE given per Diarrhea (Adult) guideline. Referrals REFERRED TO PCP OFFIC

## 2021-11-28 NOTE — Telephone Encounter (Signed)
Patient called in stating she is experiencing some nausea, diarrhea, and lower abdominal pain. She stated she think it may be her diverticulitis. Sent over to access nurse.

## 2021-11-29 ENCOUNTER — Encounter (HOSPITAL_COMMUNITY): Payer: Self-pay

## 2021-11-29 ENCOUNTER — Ambulatory Visit: Payer: BC Managed Care – PPO | Admitting: Family

## 2021-11-29 ENCOUNTER — Other Ambulatory Visit: Payer: Self-pay

## 2021-11-29 ENCOUNTER — Encounter: Payer: Self-pay | Admitting: Family

## 2021-11-29 ENCOUNTER — Emergency Department (HOSPITAL_COMMUNITY)
Admission: EM | Admit: 2021-11-29 | Discharge: 2021-11-29 | Disposition: A | Discharge status: Home / Self Care | Payer: BC Managed Care – PPO | Attending: Emergency Medicine | Admitting: Emergency Medicine

## 2021-11-29 ENCOUNTER — Emergency Department (HOSPITAL_COMMUNITY): Payer: BC Managed Care – PPO

## 2021-11-29 VITALS — BP 118/78 | HR 94 | Temp 98.2°F | Resp 16 | Ht 68.0 in | Wt 280.0 lb

## 2021-11-29 DIAGNOSIS — R1032 Left lower quadrant pain: Secondary | ICD-10-CM | POA: Insufficient documentation

## 2021-11-29 DIAGNOSIS — R1084 Generalized abdominal pain: Secondary | ICD-10-CM | POA: Diagnosis present

## 2021-11-29 LAB — CBC WITH DIFFERENTIAL/PLATELET
Abs Immature Granulocytes: 0.01 10*3/uL (ref 0.00–0.07)
Basophils Absolute: 0 10*3/uL (ref 0.0–0.1)
Basophils Relative: 0 %
Eosinophils Absolute: 0.2 10*3/uL (ref 0.0–0.5)
Eosinophils Relative: 4 %
HCT: 41.6 % (ref 36.0–46.0)
Hemoglobin: 13.2 g/dL (ref 12.0–15.0)
Immature Granulocytes: 0 %
Lymphocytes Relative: 29 %
Lymphs Abs: 1.5 10*3/uL (ref 0.7–4.0)
MCH: 28.1 pg (ref 26.0–34.0)
MCHC: 31.7 g/dL (ref 30.0–36.0)
MCV: 88.5 fL (ref 80.0–100.0)
Monocytes Absolute: 0.3 10*3/uL (ref 0.1–1.0)
Monocytes Relative: 7 %
Neutro Abs: 3.1 10*3/uL (ref 1.7–7.7)
Neutrophils Relative %: 60 %
Platelets: 315 10*3/uL (ref 150–400)
RBC: 4.7 MIL/uL (ref 3.87–5.11)
RDW: 13.5 % (ref 11.5–15.5)
WBC: 5 10*3/uL (ref 4.0–10.5)
nRBC: 0 % (ref 0.0–0.2)

## 2021-11-29 LAB — COMPREHENSIVE METABOLIC PANEL
ALT: 19 U/L (ref 0–44)
AST: 22 U/L (ref 15–41)
Albumin: 3.7 g/dL (ref 3.5–5.0)
Alkaline Phosphatase: 105 U/L (ref 38–126)
Anion gap: 10 (ref 5–15)
BUN: 6 mg/dL (ref 6–20)
CO2: 24 mmol/L (ref 22–32)
Calcium: 9.3 mg/dL (ref 8.9–10.3)
Chloride: 104 mmol/L (ref 98–111)
Creatinine, Ser: 0.85 mg/dL (ref 0.44–1.00)
GFR, Estimated: >60 mL/min (ref >60)
Glucose, Bld: 104 mg/dL — ABNORMAL HIGH (ref 70–99)
Potassium: 4 mmol/L (ref 3.5–5.1)
Sodium: 138 mmol/L (ref 135–145)
Total Bilirubin: 0.9 mg/dL (ref 0.3–1.2)
Total Protein: 7.4 g/dL (ref 6.5–8.1)

## 2021-11-29 LAB — URINALYSIS, ROUTINE W REFLEX MICROSCOPIC
Bacteria, UA: NONE SEEN
Bilirubin Urine: NEGATIVE
Glucose, UA: NEGATIVE mg/dL
Ketones, ur: NEGATIVE mg/dL
Leukocytes,Ua: NEGATIVE
Nitrite: NEGATIVE
Protein, ur: NEGATIVE mg/dL
Specific Gravity, Urine: 1.011 (ref 1.005–1.030)
pH: 5 (ref 5.0–8.0)

## 2021-11-29 LAB — LIPASE, BLOOD: Lipase: 43 U/L (ref 11–51)

## 2021-11-29 MED ORDER — KETOROLAC TROMETHAMINE 15 MG/ML IJ SOLN
15.0000 mg | Freq: Once | INTRAMUSCULAR | Status: AC
  Administered 2021-11-29: 15 mg via INTRAVENOUS
  Filled 2021-11-29: qty 1

## 2021-11-29 MED ORDER — IOHEXOL 350 MG/ML SOLN
75.0000 mL | Freq: Once | INTRAVENOUS | Status: AC | PRN
  Administered 2021-11-29: 75 mL via INTRAVENOUS

## 2021-11-29 MED ORDER — FAMOTIDINE 20 MG PO TABS
20.0000 mg | ORAL_TABLET | Freq: Once | ORAL | Status: AC
  Administered 2021-11-29: 20 mg via ORAL
  Filled 2021-11-29: qty 1

## 2021-11-29 MED ORDER — ONDANSETRON HCL 4 MG PO TABS: 4.0000 mg | ORAL_TABLET | Freq: Four times a day (QID) | ORAL | 0 refills | Status: AC

## 2021-11-29 MED ORDER — OMEPRAZOLE 20 MG PO CPDR: 20.0000 mg | DELAYED_RELEASE_CAPSULE | Freq: Every day | ORAL | 0 refills | Status: DC

## 2021-11-29 MED ORDER — DICYCLOMINE HCL 20 MG PO TABS: 20.0000 mg | ORAL_TABLET | Freq: Two times a day (BID) | ORAL | 0 refills | Status: DC

## 2021-11-29 MED ORDER — ACETAMINOPHEN 500 MG PO TABS
1000.0000 mg | ORAL_TABLET | Freq: Once | ORAL | Status: AC
  Administered 2021-11-29: 1000 mg via ORAL
  Filled 2021-11-29: qty 2

## 2021-11-29 NOTE — ED Provider Notes (Signed)
[facility] EMERGENCY DEPARTMENT Provider Note   CSN: [id number] Arrival date & time: 11/29/21  0946     History Chief Complaint  Patient presents with   Abdominal Pain    HPI Krystal Mann is a 51 y.o. female presenting for Chief complaint of abdominal pain.  She is history diverticulitis.  She endorses 4 days of progressive diffuse abdominal pain.  He denies fevers or chills, nausea vomiting, syncope or shortness of breath.  She is otherwise ambulatory tolerating p.o. intake.  Called her PCP today and recommend she come in for CT scan to evaluate for appendicitis or recurrent diverticulitis..   Patient's recorded medical, surgical, social, medication list and allergies were reviewed in the Snapshot window as part of the initial history.   Review of Systems   Review of Systems  Constitutional:  Negative for chills and fever.  HENT:  Negative for ear pain and sore throat.   Eyes:  Negative for pain and visual disturbance.  Respiratory:  Negative for cough and shortness of breath.   Cardiovascular:  Negative for chest pain and palpitations.  Gastrointestinal:  Positive for abdominal pain and nausea. Negative for vomiting.  Genitourinary:  Negative for dysuria and hematuria.  Musculoskeletal:  Negative for arthralgias and back pain.  Skin:  Negative for color change and rash.  Neurological:  Negative for seizures and syncope.  All other systems reviewed and are negative.   Physical Exam Updated Vital Signs BP 132/86 (BP Location: Left Arm)   Pulse 87   Temp 98.9 F (37.2 C)   Resp 18   Ht 5' 6.5" (1.689 m)   Wt 127.5 kg   SpO2 97%   BMI 44.68 kg/m  Physical Exam Vitals and nursing note reviewed.  Constitutional:      General: She is not in acute distress.    Appearance: She is well-developed.  HENT:     Head: Normocephalic and atraumatic.  Eyes:     Conjunctiva/sclera: Conjunctivae normal.  Cardiovascular:     Rate and Rhythm: Normal  rate and regular rhythm.     Heart sounds: No murmur heard. Pulmonary:     Effort: Pulmonary effort is normal. No respiratory distress.     Breath sounds: Normal breath sounds.  Abdominal:     Palpations: Abdomen is soft.     Tenderness: There is abdominal tenderness. There is no right CVA tenderness, left CVA tenderness or guarding. Negative signs include Murphy's sign.  Musculoskeletal:        General: No swelling.     Cervical back: Neck supple.  Skin:    General: Skin is warm and dry.     Capillary Refill: Capillary refill takes less than 2 seconds.  Neurological:     Mental Status: She is alert.  Psychiatric:        Mood and Affect: Mood normal.      ED Course/ Medical Decision Making/ A&P    Procedures Procedures   Medications Ordered in ED Medications  ketorolac (TORADOL) 15 MG/ML injection 15 mg (has no administration in time range)  acetaminophen (TYLENOL) tablet 1,000 mg (has no administration in time range)  famotidine (PEPCID) tablet 20 mg (has no administration in time range)  iohexol (OMNIPAQUE) 350 MG/ML injection 75 mL (75 mLs Intravenous Contrast Given 11/29/21 1235)   Medical Decision Making:   Krystal Mann is a 51 y.o. female who presented to the ED today with abdominal pain, detailed above.    Patient's presentation is complicated  by their history of elevated BMI, history of diverticulitis.  Patient placed on continuous vitals and telemetry monitoring while in ED which was reviewed periodically.  Complete initial physical exam performed, notably the patient  was hemodynamically stable in no acute distress.  She doesMild abdominal tenderness diffusely.  No local guarding..     Reviewed and confirmed nursing documentation for past medical history, family history, social history.    Initial Assessment:   With the patient's presentation of abdominal pain, most likely diagnosis is nonspecific etiology including musculoskeletal or IBS. Other diagnoses  were considered including (but not limited to) gastroenteritis, colitis, small bowel obstruction, appendicitis, cholecystitis, pancreatitis, nephrolithiasis, UTI, pyleonephritis, ovarian torsion. These are considered less likely due to history of present illness and physical exam findings.   This is most consistent with an acute life/limb threatening illness complicated by underlying chronic conditions.   Initial Plan:  CBC/CMP to evaluate for underlying infectious/metabolic etiology for patient's abdominal pain  Lipase to evaluate for pancreatitis  CTAB/Pelvis with contrast to evaluate for structural/surgical etiology of patients' severe abdominal pain.  Urinalysis and repeat physical assessment to evaluate for UTI/Pyelonpehritis  Empiric management of symptoms with escalating pain control and antiemetics as needed.   Initial Study Results:   Laboratory  All laboratory results reviewed without evidence of clinically relevant pathology.     Radiology All images reviewed independently. Agree with radiology report at this time.   CT Abdomen Pelvis W Contrast  Result Date: 11/29/2021 CLINICAL DATA:  Abdominal pain, acute, nonlocalized EXAM: CT ABDOMEN AND PELVIS WITH CONTRAST TECHNIQUE: Multidetector CT imaging of the abdomen and pelvis was performed using the standard protocol following bolus administration of intravenous contrast. RADIATION DOSE REDUCTION: This exam was performed according to the departmental dose-optimization program which includes automated exposure control, adjustment of the mA and/or kV according to patient size and/or use of iterative reconstruction technique. CONTRAST:  57m OMNIPAQUE IOHEXOL 350 MG/ML SOLN COMPARISON:  August 13, 2015 FINDINGS: Lower chest: No acute abnormality. Hepatobiliary: Gallbladder is unremarkable. No focal hepatic lesion. No intrahepatic or extrahepatic biliary ductal dilation. Query hepatic steatosis. Pancreas: Unremarkable. No pancreatic ductal  dilatation or surrounding inflammatory changes. Spleen: Normal in size without focal abnormality. Adrenals/Urinary Tract: Adrenal glands are unremarkable. No hydronephrosis. No obstructing nephrolithiasis. Kidneys enhance symmetrically. Bladder is decompressed. Stomach/Bowel: No evidence of bowel obstruction. Scattered mild diverticulosis throughout the colon. No evidence of acute diverticulitis. Appendix is normal. Vascular/Lymphatic: No significant vascular findings are present. No enlarged abdominal or pelvic lymph nodes. Reproductive: IUD.  Adnexa are unremarkable. Other: No free air or free fluid. Musculoskeletal: No acute or significant osseous findings. IMPRESSION: 1. No suspicious findings in the abdomen or pelvis to explain abdominal pain. 2. Query hepatic steatosis. Electronically Signed   By: SValentino SaxonM.D.   On: 11/29/2021 12:49    Final Reassessment and Plan:   Ultimately, patient was observed in the emergency department for 3 and half hours.  She has had no interval worsening.  She is currently in no acute distress.  Had a prolonged conversation with the patient, given no acute pathology appreciated today, patient stable for outpatient care and management.  Will refer patient to gastroenterology, recommend she trial PPI, Bentyl, Zofran as needed and follow-up with PCP in the interim. We also discussed her elevated risk for colon cancer given significant family history of similar. She stated that she would call the gastroenterologist to get reevaluated for colonoscopy. Disposition:  I have considered need for hospitalization, however, considering all of the  above, I believe this patient is stable for discharge at this time.  Patient/family educated about specific return precautions for given chief complaint and symptoms.  Patient/family educated about follow-up with PCP/Gastro.     Patient/family expressed understanding of return precautions and need for follow-up. Patient spoken to  regarding all imaging and laboratory results and appropriate follow up for these results. All education provided in verbal form with additional information in written form. Time was allowed for answering of patient questions. Patient discharged.    Emergency Department Medication Summary:   Medications  ketorolac (TORADOL) 15 MG/ML injection 15 mg (has no administration in time range)  acetaminophen (TYLENOL) tablet 1,000 mg (has no administration in time range)  famotidine (PEPCID) tablet 20 mg (has no administration in time range)  iohexol (OMNIPAQUE) 350 MG/ML injection 75 mL (75 mLs Intravenous Contrast Given 11/29/21 1235)           Clinical Impression:  1. Generalized abdominal pain      Discharge   Final Clinical Impression(s) / ED Diagnoses Final diagnoses:  Generalized abdominal pain    Rx / DC Orders ED Discharge Orders          Ordered    Ambulatory referral to Gastroenterology        11/29/21 1308    ondansetron (ZOFRAN) 4 MG tablet  Every 6 hours        11/29/21 1311    omeprazole (PRILOSEC) 20 MG capsule  Daily        11/29/21 1311    dicyclomine (BENTYL) 20 MG tablet  2 times daily        11/29/21 1311              Tretha Sciara, MD 11/29/21 1313

## 2021-11-29 NOTE — Assessment & Plan Note (Signed)
Moderate to severe abdominal pain on palpation , pt tearing up with exam. Reviewed 2016 colonoscopy. Worry for obstruction vs diverticulosis. Pt stable upon leaving, advised to go to ER. Advised this could be potential illness that could pose a threat to life or bodily function. Pt agreeable to plan.

## 2021-11-29 NOTE — Progress Notes (Signed)
Established Patient Office Visit  Subjective:  Patient ID: Krystal Mann, female    DOB: 1970/02/08  Age: 51 y.o. MRN: [id number]  CC:  Chief Complaint  Patient presents with   Diverticulitis    Never last this long   Nausea   Diarrhea   Abdominal Pain    Left side cramp sharp X 2 weeks    HPI Krystal Mann is here today with concerns.   For the last ten days with abdominal pain. Two Mondays ago started with nausea and fatigue. She felt as though she would throw up (which she never did throw up ) but she did have diarrhea. Se has already had diarrhea three times as of this am. Her normal otherwise would be to have constipation. Denies seeing blood in stool. The pain is sharp and cramping on the left lower side. Does see some mucous in the stool.  At current pain is 0/10, otherwise comes and goes. When the pain comes it is 10/10 and lasts for about a few seconds up to one minute.   Yesterday all day with diarrhea x 2, more today already however. Does often get the urge to go to the bathroom.   Suspects diverticulitis, as she has had it in the past, last episode maybe slightly over one year ago.   Colonoscopy 2016 with polyp and also diverticulosis. Overdue as was supposed to repeat in two years due to fhx colon cancer and polyposis.   Past Medical History:  Diagnosis Date   Anemia    Arthritis    knee   Axillary abscess 06/13/2014   Back pain    Breast lump on right side at 9 o'clock position 07/27/2012   Referred patient to the [facility] for diagnostic mammogram and right breast ultrasound. Appointment scheduled for Wednesday, August 04, 2012 at 1510.    Chicken pox    Depression 2004   Diverticulitis    Migraines    no longer has them. Was medication related   Rotator cuff (capsule) sprain    right   Seasonal allergies    Shortness of breath dyspnea    occasionally    Past Surgical History:  Procedure Laterality Date   APPLICATION OF  A-CELL OF CHEST/ABDOMEN Bilateral 12/31/2015   Procedure: APPLICATION OF A-CELL OF BILATERAL AXILLA;  Surgeon: Coralie Keens, MD;  Location: [place];  Service: General;  Laterality: Bilateral;   BREAST CYST ASPIRATION  2014   CERVICAL DISCECTOMY  2012   HYDRADENITIS EXCISION Bilateral 12/31/2015   Procedure: WIDE EXCISION BILATERAL AXILLARY HIDRADENITIS WITH ACELL APPLICATION;  Surgeon: Coralie Keens, MD;  Location: [place];  Service: General;  Laterality: Bilateral;   JOINT REPLACEMENT     TOTAL KNEE ARTHROPLASTY Right 06/18/2015   Procedure: TOTAL KNEE ARTHROPLASTY;  Surgeon: Frederik Pear, MD;  Location: [place];  Service: Orthopedics;  Laterality: Right;   TUBAL LIGATION      Family History  Problem Relation Age of Onset   Kidney cancer Mother        malaginant cancer (kidney), skin cancer   Diabetes Mother    Hyperlipidemia Mother    Skin cancer Mother    Cancer Mother        colon   Lung cancer Father    Diabetes Sister    Asthma Sister    Colon polyps Brother 6       x 45 removed   Liver cancer Brother 66   Colon  polyps Brother     Social History   Socioeconomic History   Marital status: Divorced    Spouse name: Not on file   Number of children: 2   Years of education: Not on file   Highest education level: Some college, no degree  Occupational History   Occupation: student bus driver   Occupation: customer service -road side assistance  Tobacco Use   Smoking status: Never   Smokeless tobacco: Never  Vaping Use   Vaping Use: Never used  Substance and Sexual Activity   Alcohol use: Yes    Alcohol/week: 0.0 standard drinks of alcohol    Comment: social   Drug use: No   Sexual activity: Yes    Partners: Male    Birth control/protection: Surgical    Comment: tubial ligation  Other Topics Concern   Not on file  Social History Narrative   Single.   2 Children, 1 grandson.   Works as a Teacher, early years/pre.   Enjoys  working with her church, singing, makes keep sake items.   Social Determinants of Health   Financial Resource Strain: Not on file  Food Insecurity: Not on file  Transportation Needs: No Transportation Needs (02/18/2018)   PRAPARE - Hydrologist (Medical): No    Lack of Transportation (Non-Medical): No  Physical Activity: Not on file  Stress: Not on file  Social Connections: Not on file  Intimate Partner Violence: Not on file    Outpatient Medications Prior to Visit  Medication Sig Dispense Refill   acetaminophen (TYLENOL) 325 MG tablet Take 650 mg by mouth. 6A, 12N, 6P, 12MN     meclizine (ANTIVERT) 25 MG tablet Take 0.5-1 tablets (12.5-25 mg total) by mouth 3 (three) times daily as needed for dizziness. (Patient not taking: Reported on 11/29/2021) 30 tablet 0   fluticasone (FLONASE) 50 MCG/ACT nasal spray Place 2 sprays into both nostrils daily. (Patient not taking: Reported on 11/29/2021) 16 g 0   meloxicam (MOBIC) 15 MG tablet Take 1 tablet (15 mg total) by mouth daily as needed for pain. (Patient not taking: Reported on 11/29/2021) 90 tablet 0   No facility-administered medications prior to visit.    Allergies  Allergen Reactions   Morphine And Related Itching and Nausea And Vomiting   Medroxyprogesterone     Gave her migraine for 3 months   Hydrocodone Itching        Objective:    Physical Exam Constitutional:      Appearance: She is well-developed. She is obese.  Abdominal:     General: Bowel sounds are absent.     Palpations: Abdomen is soft.     Tenderness: There is abdominal tenderness in the right lower quadrant and left lower quadrant. There is guarding and rebound.  Neurological:     Mental Status: She is alert.     BP 118/78   Pulse 94   Temp 98.2 F (36.8 C)   Resp 16   Ht '5\' 8"'$  (1.727 m)   Wt 280 lb (127 kg)   SpO2 97%   BMI 42.57 kg/m  Wt Readings from Last 3 Encounters:  11/29/21 280 lb (127 kg)  09/06/20 294 lb  (133.4 kg)  08/02/20 299 lb (135.6 kg)     Health Maintenance Due  Topic Date Due   TETANUS/TDAP  Never done   COLONOSCOPY (Pts 45-6yr Insurance coverage will need to be confirmed)  08/30/2016   Zoster Vaccines- Shingrix (1 of 2) Never  done   MAMMOGRAM  02/19/2020    There are no preventive care reminders to display for this patient.  Lab Results  Component Value Date   TSH 1.650 08/02/2018   Lab Results  Component Value Date   WBC 4.7 08/02/2020   HGB 12.7 08/02/2020   HCT 38.8 08/02/2020   MCV 86.7 08/02/2020   PLT 321.0 08/02/2020   Lab Results  Component Value Date   NA 138 08/02/2020   K 3.9 08/02/2020   CO2 27 08/02/2020   GLUCOSE 112 (H) 08/02/2020   BUN 7 08/02/2020   CREATININE 0.78 08/02/2020   BILITOT 0.7 08/02/2020   ALKPHOS 109 08/02/2020   AST 15 08/02/2020   ALT 11 08/02/2020   PROT 7.7 08/02/2020   ALBUMIN 4.1 08/02/2020   CALCIUM 9.1 08/02/2020   ANIONGAP 7 06/19/2015   GFR 87.65 08/02/2020   Lab Results  Component Value Date   HGBA1C 6.0 08/03/2020      Assessment & Plan:   Problem List Items Addressed This Visit       Other   Left lower quadrant abdominal pain - Primary    Moderate to severe abdominal pain on palpation , pt tearing up with exam. Reviewed 2016 colonoscopy. Worry for obstruction vs diverticulosis. Pt stable upon leaving, advised to go to ER. Advised this could be potential illness that could pose a threat to life or bodily function. Pt agreeable to plan.       No orders of the defined types were placed in this encounter.   Follow-up: No follow-ups on file.    Eugenia Pancoast, FNP

## 2021-11-29 NOTE — Discharge Instructions (Addendum)
You were seen today for abdominal pain. We did not identify any emergent cause for your symptoms. Your evaluation is most consistent with nonspecific etiology.   Plan and next steps:   The following may be helpful in managing your symptoms:   Pain- Lidocaine Patches  Apply to affected area for up to 12 hours at a time.   Pain/Fever- Adult Tylenol dosing:  650 mg orally every 4 to 6 hours as needed, MAX: 3250 mg/24 hours   (Extra-strength) 1000 mg orally every 6 hours as needed; MAX: 3000 mg/24 hours   Do not use if you have liver disease. Read the label on the bottle.   Pain/Fever- Adult Ibuprofen Dosing  200 to 400 mg orally every 4 to 6 hours as needed; MAX 1200 mg/day; do not take longer than 10 days   Do not use if you have kidney disease. Read the label on the bottle   Nausea- You have been prescribed Zofran Take '4mg'$  by mouth as needed every 6 hours approximately 30 minutes before eating.  Findings:  You may see all of your lab and imaging results utilizing our online portal! Look in this document or ask a team member for your mychart* access information. The most notable results have additionally been verbally communicated with you and your bedside family.    Follow-up Plan:   Follow up with the patient's normal primary care provider for monitoring of this condition within 48 hours.   Signs/Symptoms that would warrant return to the ED:  Please return to the ED if you experience worsening of symptoms or any abrupt changes in your health. Standard of care precautions for your chief complaint have already been verbally communicated with you. Always be on alert for fevers, chills, shortness of breath, chest pains, or sudden changes that warrant immediate evaluation.    Thank you for allowing [place] to be a part of you and your families' care.   Tretha Sciara MD

## 2021-11-29 NOTE — Telephone Encounter (Signed)
Saw patient already, thank you for speaking with the patient.  Please see note for further information.  

## 2021-11-29 NOTE — ED Triage Notes (Signed)
Pt arrived POV from home c/o LLQ abdominal pain x2 weeks. Pt also endorses N/V. Pt has a hx of diverticulitis.

## 2021-11-29 NOTE — ED Provider Triage Note (Signed)
Emergency Medicine Provider Triage Evaluation Note  Krystal Mann , a 51 y.o. female  was evaluated in triage.  Pt complains of LLQ pain over the last 2 weeks.  Hx of diverticulitis, states this feels similar.  Endorses nausea and chills, however no vomiting or fevers.  Denies bloody bowel movements or black tarry stools.  Occasionally feels as if "things might come out both ends".  Over the last 2 days, patient has thought things would get better as they sometimes do, however continues to get worse.  Denies chest pain, shortness of breath, or urinary symptoms  Review of Systems  Positive:  Negative: See above  Physical Exam  BP 132/86 (BP Location: Left Arm)   Pulse 87   Temp 98.9 F (37.2 C)   Resp 18   Ht 5' 6.5" (1.689 m)   Wt 127.5 kg   SpO2 97%   BMI 44.68 kg/m  Gen:   Awake, no distress   Resp:  Normal effort MSK:   Moves extremities without difficulty  Other:  LLQ tenderness.  No flank tenderness.  Abdomen soft.  Medical Decision Making  Medically screening exam initiated at 10:32 AM.  Appropriate orders placed.  Kayleen Memos Jeziorski was informed that the remainder of the evaluation will be completed by another provider, this initial triage assessment does not replace that evaluation, and the importance of remaining in the ED until their evaluation is complete.     Prince Rome, PA-C 37/62/83 1036

## 2021-12-19 ENCOUNTER — Ambulatory Visit: Payer: BC Managed Care – PPO | Admitting: Primary Care

## 2021-12-19 ENCOUNTER — Encounter: Payer: Self-pay | Admitting: Nurse Practitioner

## 2021-12-19 ENCOUNTER — Encounter: Payer: Self-pay | Admitting: Primary Care

## 2021-12-19 VITALS — BP 136/80 | HR 75 | Temp 97.5°F | Ht 66.5 in | Wt 286.0 lb

## 2021-12-19 DIAGNOSIS — F32A Depression, unspecified: Secondary | ICD-10-CM | POA: Insufficient documentation

## 2021-12-19 DIAGNOSIS — Z1211 Encounter for screening for malignant neoplasm of colon: Secondary | ICD-10-CM

## 2021-12-19 DIAGNOSIS — R7303 Prediabetes: Secondary | ICD-10-CM | POA: Diagnosis not present

## 2021-12-19 DIAGNOSIS — R1032 Left lower quadrant pain: Secondary | ICD-10-CM | POA: Diagnosis not present

## 2021-12-19 DIAGNOSIS — G8929 Other chronic pain: Secondary | ICD-10-CM | POA: Diagnosis not present

## 2021-12-19 DIAGNOSIS — M2550 Pain in unspecified joint: Secondary | ICD-10-CM | POA: Diagnosis not present

## 2021-12-19 DIAGNOSIS — F331 Major depressive disorder, recurrent, moderate: Secondary | ICD-10-CM

## 2021-12-19 DIAGNOSIS — F329 Major depressive disorder, single episode, unspecified: Secondary | ICD-10-CM | POA: Insufficient documentation

## 2021-12-19 DIAGNOSIS — Z800 Family history of malignant neoplasm of digestive organs: Secondary | ICD-10-CM | POA: Diagnosis not present

## 2021-12-19 LAB — SEDIMENTATION RATE: Sed Rate: 62 mm/hr — ABNORMAL HIGH (ref 0–30)

## 2021-12-19 LAB — C-REACTIVE PROTEIN: CRP: <1 mg/dL (ref 0.5–20.0)

## 2021-12-19 LAB — HEMOGLOBIN A1C: Hgb A1c MFr Bld: 6.3 % (ref 4.6–6.5)

## 2021-12-19 LAB — URIC ACID: Uric Acid, Serum: 7 mg/dL (ref 2.4–7.0)

## 2021-12-19 MED ORDER — DULOXETINE HCL 30 MG PO CPEP: 30.0000 mg | ORAL_CAPSULE | Freq: Every day | ORAL | 0 refills | Status: DC

## 2021-12-19 NOTE — Assessment & Plan Note (Addendum)
Recent emergency department visit.  ED notes, labs, imaging reviewed.  Given her ongoing symptoms we will refer her over to GI for her colonoscopy and further evaluation.  Follow-up Do question muscular etiology given her significantly altered gait and need to use abdominal muscles with walking.

## 2021-12-19 NOTE — Assessment & Plan Note (Signed)
Overdue, referred for colonoscopy last year but unable to go.  She agrees to proceed with colonoscopy now, referral placed to GI.

## 2021-12-19 NOTE — Assessment & Plan Note (Signed)
Repeat A1c pending. 

## 2021-12-19 NOTE — Progress Notes (Signed)
Subjective:    Patient ID: Krystal Mann, female    DOB: 17-May-1970, 51 y.o.   MRN: [id number]  Abdominal Pain Associated symptoms include arthralgias, constipation, diarrhea, headaches and nausea. Pertinent negatives include no vomiting.    Krystal Mann is a very pleasant 51 y.o. female with a history of osteoarthritis, abnormal uterine bleeding, lower extremity edema, iron deficiency anemia, prediabetes, frequent headaches who presents today to discuss abdominal pain, chronic joint pain, depression, and for ED follow-up.  1) Abdominal Pain: Evaluated at [facility] ED on 11/29/2021 for a 4-day history of progressive, diffuse abdominal pain.  Initially evaluated by Cassandria Santee, NP who referred her to the ED for CT and further evaluation.  During her stay in the ED she underwent CBC/CMP/lipase, all of which were grossly negative.  She underwent CT abdomen/pelvis which was negative for acute diverticulitis/appendicitis/other.  Urinalysis revealed moderate blood, negative for leukocytes and nitrates.  She was discharged home later that afternoon with a referral to GI, trial of PPI, Bentyl, Zofran as needed.  Since her ED visit she continues to experience abdominal pain which is mostly located to the LLQ, sometimes to the RLQ. Her pain occurs daily and is intermittent. Also with daily and frequent nausea, alternating constipation and diarrhea. She's had four bowel movements since 11/29/21. She's had to miss several days of work given her symptoms. She struggles to walk in general, uses her abdominal muscles when walking. She has a strong family history of colon cancer in mother and 2 brothers. Her last colonoscopy was in 2016. During her visit with [place] in 2022 we referred her to GI for colonoscopy but she did not complete as she lost her insurance.   2) Depression: Chronic for years, worse over the last 1 year. Her depression began with her chronic pain and has increased over the last year given  unfavorable circumstances with her job, car, and finances.  She has had to depend on family members to provide her with transportation, and she feels like she's "a burden to everyone". She's tired of being in pain. She's partly avoided her colonoscopy as "I don't want to be here anymore". She doesn't have a plan to harm herself but has had thoughts of not wanting to live.  3) Chronic Joint Pain: Chronic to most every joint in her body, but worse to the knees, lower back, ankles.  She underwent right total knee replacement in 2017 and cervical discectomy in 2012.  Pain is worse with weightbearing activity.  Unfortunately, she has gained weight since the pandemic as she has been more sedentary.  She walks with a cane and walker, has fallen a few times.  She is requesting renewal of her handicap placard.  She has never been tested for rheumatoid arthritis.  She has not seen orthopedics or anyone for her pain recently.   Review of Systems  Gastrointestinal:  Positive for abdominal pain, constipation, diarrhea and nausea. Negative for blood in stool and vomiting.  Musculoskeletal:  Positive for arthralgias.  Neurological:  Positive for headaches.  Psychiatric/Behavioral:  The patient is nervous/anxious.        See HPI         Past Medical History:  Diagnosis Date   Anemia    Arthritis    knee   Axillary abscess 06/13/2014   Back pain    Breast lump on right side at 9 o'clock position 07/27/2012   Referred patient to the [facility] for diagnostic mammogram and right breast  ultrasound. Appointment scheduled for Wednesday, August 04, 2012 at 1510.    Chicken pox    Depression 2004   Diverticulitis    Migraines    no longer has them. Was medication related   Rotator cuff (capsule) sprain    right   Seasonal allergies    Shortness of breath dyspnea    occasionally    Social History   Socioeconomic History   Marital status: Single    Spouse name: Not on file   Number of  children: 2   Years of education: Not on file   Highest education level: Some college, no degree  Occupational History   Occupation: student bus driver   Occupation: customer service -road [employer]  Tobacco Use   Smoking status: Never   Smokeless tobacco: Never  Vaping Use   Vaping Use: Never used  Substance and Sexual Activity   Alcohol use: Yes    Alcohol/week: 0.0 standard drinks of alcohol    Comment: social   Drug use: No   Sexual activity: Yes    Partners: Male    Birth control/protection: Surgical    Comment: tubial ligation  Other Topics Concern   Not on file  Social History Narrative   Single.   2 Children, 1 grandson.   Works as a Teacher, early years/pre.   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   Social Determinants of Health   Financial Resource Strain: Not on file  Food Insecurity: Not on file  Transportation Needs: No Transportation Needs (02/18/2018)   PRAPARE - Hydrologist (Medical): No    Lack of Transportation (Non-Medical): No  Physical Activity: Not on file  Stress: Not on file  Social Connections: Not on file  Intimate Partner Violence: Not on file    Past Surgical History:  Procedure Laterality Date   APPLICATION OF A-CELL OF CHEST/ABDOMEN Bilateral 12/31/2015   Procedure: APPLICATION OF A-CELL OF BILATERAL AXILLA;  Surgeon: Coralie Keens, MD;  Location: [facility];  Service: General;  Laterality: Bilateral;   BREAST CYST ASPIRATION  2014   CERVICAL DISCECTOMY  2012   HYDRADENITIS EXCISION Bilateral 12/31/2015   Procedure: WIDE EXCISION BILATERAL AXILLARY HIDRADENITIS WITH ACELL APPLICATION;  Surgeon: Coralie Keens, MD;  Location: [employer] [facility];  Service: General;  Laterality: Bilateral;   JOINT REPLACEMENT     TOTAL KNEE ARTHROPLASTY Right 06/18/2015   Procedure: TOTAL KNEE ARTHROPLASTY;  Surgeon: Frederik Pear, MD;  Location: [facility];  Service: Orthopedics;   Laterality: Right;   TUBAL LIGATION      Family History  Problem Relation Age of Onset   Kidney cancer Mother        malaginant cancer (kidney), skin cancer   Diabetes Mother    Hyperlipidemia Mother    Skin cancer Mother    Cancer Mother        colon   Lung cancer Father    Diabetes Sister    Asthma Sister    Colon polyps Brother 60       x 21 removed   Liver cancer Brother 56   Colon polyps Brother     Allergies  Allergen Reactions   Morphine And Related Itching and Nausea And Vomiting   Medroxyprogesterone     Gave her migraine for 3 months   Hydrocodone Itching    Current Outpatient Medications on File Prior to Visit  Medication Sig Dispense Refill   acetaminophen (TYLENOL) 325 MG  tablet Take 650 mg by mouth. 6A, 12N, 6P, 12MN     No current facility-administered medications on file prior to visit.    BP 136/80   Pulse 75   Temp (!) 97.5 F (36.4 C) (Temporal)   Ht 5' 6.5" (1.689 m)   Wt 286 lb (129.7 kg)   SpO2 98%   BMI 45.47 kg/m  Objective:   Physical Exam Cardiovascular:     Rate and Rhythm: Normal rate and regular rhythm.  Pulmonary:     Effort: Pulmonary effort is normal.     Breath sounds: Normal breath sounds.  Abdominal:     Palpations: Abdomen is soft.     Tenderness: There is abdominal tenderness in the right lower quadrant and left lower quadrant.  Musculoskeletal:     Cervical back: Neck supple.     Comments: Significantly altered gait, mostly to the knees bilaterally.  Walking with a cane in the office today, unstable.  Skin:    General: Skin is warm and dry.  Neurological:     Mental Status: She is alert.  Psychiatric:        Mood and Affect: Mood normal.     Comments: Tearful at times during visit           Assessment & Plan:   Problem List Items Addressed This Visit       Other   Family history of colon cancer    Overdue, referred for colonoscopy last year but unable to go.  She agrees to proceed with colonoscopy  now, referral placed to GI.      Relevant Orders   Ambulatory referral to Gastroenterology   Prediabetes    Repeat A1c pending.      Relevant Orders   Hemoglobin A1c   Left lower quadrant abdominal pain - Primary    Recent emergency department visit.  ED notes, labs, imaging reviewed.  Given her ongoing symptoms we will refer her over to GI for her colonoscopy and further evaluation.  Follow-up Do question muscular etiology given her significantly altered gait and need to use abdominal muscles with walking.       Relevant Orders   Ambulatory referral to Gastroenterology   Chronic joint pain    Significant debilitation, joint pain.  Checking labs today for evaluation of rheumatoid arthritis and gout.  Consider referral to orthopedics once labs have returned.      Relevant Medications   DULoxetine (CYMBALTA) 30 MG capsule   Other Relevant Orders   C-reactive protein   Cyclic citrul peptide antibody, IgG   Rheumatoid factor   Sedimentation rate   Uric acid   MDD (major depressive disorder)    Discussed options today for treatment, she opts for both therapy and medication.  Referral placed for therapy.  Start duloxetine 30 mg daily.  This was chosen given her history of chronic pain and depression. Will plan to see her back in the office in 4 to 6 weeks for follow-up.  She contracts to safety today.      Relevant Medications   DULoxetine (CYMBALTA) 30 MG capsule   Other Relevant Orders   Ambulatory referral to Psychology   Other Visit Diagnoses     Screening for colon cancer       Relevant Orders   Ambulatory referral to Gastroenterology          Pleas Koch, NP

## 2021-12-19 NOTE — Assessment & Plan Note (Signed)
Discussed options today for treatment, she opts for both therapy and medication.  Referral placed for therapy.  Start duloxetine 30 mg daily.  This was chosen given her history of chronic pain and depression. Will plan to see her back in the office in 4 to 6 weeks for follow-up.  She contracts to safety today.

## 2021-12-19 NOTE — Assessment & Plan Note (Signed)
Significant debilitation, joint pain.  Checking labs today for evaluation of rheumatoid arthritis and gout.  Consider referral to orthopedics once labs have returned.

## 2021-12-19 NOTE — Patient Instructions (Signed)
Stop by the lab prior to leaving today. I will notify you of your results once received.   Duloxetine (Cymbalta) 30 mg once daily for depression.  You will either be contacted via phone regarding your referral to GI, or you may receive a letter on your MyChart portal from our referral team with instructions for scheduling an appointment. Please let us know if you have not been contacted by anyone within two weeks.  Schedule a follow-up visit for 4-6 weeks.  It was a pleasure to see you today!

## 2021-12-22 LAB — RHEUMATOID FACTOR: Rheumatoid fact SerPl-aCnc: <14 IU/mL (ref <14)

## 2021-12-22 LAB — CYCLIC CITRUL PEPTIDE ANTIBODY, IGG: Cyclic Citrullin Peptide Ab: <16 UNITS

## 2022-01-16 ENCOUNTER — Ambulatory Visit: Payer: BC Managed Care – PPO | Admitting: Primary Care

## 2022-01-23 ENCOUNTER — Encounter: Payer: Self-pay | Admitting: Nurse Practitioner

## 2022-01-23 ENCOUNTER — Ambulatory Visit: Payer: BC Managed Care – PPO | Admitting: Nurse Practitioner

## 2022-01-23 VITALS — BP 120/88 | HR 94 | Ht 67.0 in | Wt 265.0 lb

## 2022-01-23 DIAGNOSIS — Z800 Family history of malignant neoplasm of digestive organs: Secondary | ICD-10-CM

## 2022-01-23 DIAGNOSIS — K625 Hemorrhage of anus and rectum: Secondary | ICD-10-CM

## 2022-01-23 DIAGNOSIS — R110 Nausea: Secondary | ICD-10-CM

## 2022-01-23 DIAGNOSIS — Z86010 Personal history of colonic polyps: Secondary | ICD-10-CM | POA: Diagnosis not present

## 2022-01-23 DIAGNOSIS — K6289 Other specified diseases of anus and rectum: Secondary | ICD-10-CM | POA: Diagnosis not present

## 2022-01-23 MED ORDER — AMBULATORY NON FORMULARY MEDICATION: 1 refills | Status: DC

## 2022-01-23 MED ORDER — METOCLOPRAMIDE HCL 10 MG PO TABS: 10.0000 mg | ORAL_TABLET | ORAL | 0 refills | Status: DC

## 2022-01-23 NOTE — Progress Notes (Signed)
Assessment    Patient profile:  Marguerite Barba is a 52 y.o. year old female , known to Dr. Carlean Purl with a past medical history of colon polyps in 2016, family history colon cancer in mother and brother, obesity. See PMH / [place] for additional history.  Referred by PCP for abdominal pain and colon cancer screening  # Nausea x 2 months without associated vomiting or weight loss. Unremarkable CT scan in November.  Cannot correlate timewise with any medications. No NSAID use.   # Intermittent LLQ / bowel changes ( currently constipation) / rectal pain and bleeding. All symptoms started within the last 1-2 months. The bleeding could be from an anal fissure given the pain she had on attempted rectal exam. Could be hemorrhoidal vrs neoplasm. Recent CT scan w contrast in ED was unremarkable which is reassuring   # [facility] of colon cancer in mother and brother ( age 57) / personal history of colon polyps ( small TA in 2016). Recommended 2 year follow up colonoscopy in 2018 was not done.     Plan:    Schedule for colonoscopy with two day prep. The risks and benefits of colonoscopy with possible polypectomy / biopsies were discussed and the patient agrees to proceed.  Give 10 mg Reglan 1 hour before each dose of bowel prep given nausea Add two colace at bedtime Schedule for EGD for evaluation of nausea. The risks and benefits of EGD with possible biopsies were discussed with the patient who agrees to proceed.  Treat empirically for anal fissure. Diltiazem 2% gel. Apply inside anus as directed Q 8 hours x 6 week.    HPI:    Chief Complaint: nausea , bowel changes, rectal bleeding for a month, overdue for colonoscopy   Interval history: Doroteo Bradford is is known to Dr. Carlean Purl for history of colon polyps and also family history colon cancer.  She was seen in the ED 11/29/2021 with abdominal pain.  CBC, c-Met and lipase unremarkable. ESR 62.  CT scan with contrast without acute abnormality . discharged  home after pain improved.   Doroteo Bradford has been having LLQ abdominal pain  and nausea without vomiting since October. No NSAID use. No known history of PUD. No upper abdominal pain. Pain doesn't improve with defecation. Episodes occur a couple of times a week. Laying down helps. Weight fluctuating.   She has also been having bowel changes over the last couple of months. She has has both loose stools and constipation. More recently has been constipated which she defines as larger than normal stools. She has been having rectal bleeding with bowel movements over the last month. She is passing some clots ( saw photo)   Previous Labs / Imaging::    Latest Ref Rng & Units 11/29/2021   10:48 AM 08/02/2020   12:51 PM 08/02/2018    2:42 PM  CBC  WBC 4.0 - 10.5 K/uL 5.0  4.7  6.6   Hemoglobin 12.0 - 15.0 g/dL 13.2  12.7  8.9   Hematocrit 36.0 - 46.0 % 41.6  38.8  28.8   Platelets 150 - 400 K/uL 315  321.0  440     Lab Results  Component Value Date   LIPASE 43 11/29/2021      Latest Ref Rng & Units 11/29/2021   10:48 AM 08/02/2020   12:51 PM 08/13/2015   12:21 PM  CMP  Glucose 70 - 99 mg/dL 104  112  93   BUN 6 - 20 mg/dL  $'6  7  6   'J$ Creatinine 0.44 - 1.00 mg/dL 0.85  0.78  0.60   Sodium 135 - 145 mmol/L 138  138  138   Potassium 3.5 - 5.1 mmol/L 4.0  3.9  3.3   Chloride 98 - 111 mmol/L 104  103  103   CO2 22 - 32 mmol/L '24  27  29   '$ Calcium 8.9 - 10.3 mg/dL 9.3  9.1  9.2   Total Protein 6.5 - 8.1 g/dL 7.4  7.7  7.5   Total Bilirubin 0.3 - 1.2 mg/dL 0.9  0.7  0.8   Alkaline Phos 38 - 126 U/L 105  109  97   AST 15 - 41 U/L '22  15  11   '$ ALT 0 - 44 U/L '19  11  6       '$ Previous GI Evaluation   Aug 2016 screening colonoscopy -4 mm sessile polyp.  Mild diverticulosis Path - tubular adenoma  Imaging:  CT Abdomen Pelvis W Contrast CLINICAL DATA:  Abdominal pain, acute, nonlocalized  EXAM: CT ABDOMEN AND PELVIS WITH CONTRAST  TECHNIQUE: Multidetector CT imaging of the abdomen and  pelvis was performed using the standard protocol following bolus administration of intravenous contrast.  RADIATION DOSE REDUCTION: This exam was performed according to the departmental dose-optimization program which includes automated exposure control, adjustment of the mA and/or kV according to patient size and/or use of iterative reconstruction technique.  CONTRAST:  90m OMNIPAQUE IOHEXOL 350 MG/ML SOLN  COMPARISON:  August 13, 2015  FINDINGS: Lower chest: No acute abnormality.  Hepatobiliary: Gallbladder is unremarkable. No focal hepatic lesion. No intrahepatic or extrahepatic biliary ductal dilation. Query hepatic steatosis.  Pancreas: Unremarkable. No pancreatic ductal dilatation or surrounding inflammatory changes.  Spleen: Normal in size without focal abnormality.  Adrenals/Urinary Tract: Adrenal glands are unremarkable. No hydronephrosis. No obstructing nephrolithiasis. Kidneys enhance symmetrically. Bladder is decompressed.  Stomach/Bowel: No evidence of bowel obstruction. Scattered mild diverticulosis throughout the colon. No evidence of acute diverticulitis. Appendix is normal.  Vascular/Lymphatic: No significant vascular findings are present. No enlarged abdominal or pelvic lymph nodes.  Reproductive: IUD.  Adnexa are unremarkable.  Other: No free air or free fluid.  Musculoskeletal: No acute or significant osseous findings.  IMPRESSION: 1. No suspicious findings in the abdomen or pelvis to explain abdominal pain. 2. Query hepatic steatosis.  Electronically Signed   By: SValentino SaxonM.D.   On: 11/29/2021 12:49    Past Medical History:  Diagnosis Date   Anemia    Arthritis    knee   Axillary abscess 06/13/2014   Back pain    Breast lump on right side at 9 o'clock position 07/27/2012   [facility]. Appointment scheduled for Wednesday, August 04, 2012  at 1510.    Chicken pox    Depression 2004   Diverticulitis    Migraines    no longer has them. Was medication related   Rotator cuff (capsule) sprain    right   Seasonal allergies    Shortness of breath dyspnea    occasionally   Past Surgical History:  Procedure Laterality Date   APPLICATION OF A-CELL OF CHEST/ABDOMEN Bilateral 12/31/2015   Procedure: APPLICATION OF A-CELL OF BILATERAL AXILLA;  Surgeon: DCoralie Keens MD;  [facility]  Service: General;  Laterality: Bilateral;   BREAST CYST ASPIRATION  2014   CERVICAL DISCECTOMY  2012   HYDRADENITIS EXCISION  Bilateral 12/31/2015   Procedure: WIDE EXCISION BILATERAL AXILLARY HIDRADENITIS WITH ACELL APPLICATION;  Surgeon: Coralie Keens, MD;  Location: [facility];  Service: General;  Laterality: Bilateral;   JOINT REPLACEMENT     TOTAL KNEE ARTHROPLASTY Right 06/18/2015   Procedure: TOTAL KNEE ARTHROPLASTY;  Surgeon: Frederik Pear, MD;  Location: [facility];  Service: Orthopedics;  Laterality: Right;   TUBAL LIGATION     Family History  Problem Relation Age of Onset   Kidney cancer Mother        malaginant cancer (kidney), skin cancer   Diabetes Mother    Hyperlipidemia Mother    Skin cancer Mother    Cancer Mother        colon   Lung cancer Father    Diabetes Sister    Asthma Sister    Colon polyps Brother 75       x 83 removed   Liver cancer Brother 45   Colon polyps Brother    Social History   Tobacco Use   Smoking status: Never   Smokeless tobacco: Never  Vaping Use   Vaping Use: Never used  Substance Use Topics   Alcohol use: Yes    Alcohol/week: 0.0 standard drinks of alcohol    Comment: social   Drug use: No   Current Outpatient Medications  Medication Sig Dispense Refill   acetaminophen (TYLENOL) 325 MG tablet Take 650 mg by mouth. 6A, 12N, 6P, 12MN     DULoxetine (CYMBALTA) 30 MG capsule Take 1 capsule (30 mg total) by mouth daily. For pain and depression 90 capsule  0   No current facility-administered medications for this visit.   Allergies  Allergen Reactions   Morphine And Related Itching and Nausea And Vomiting   Medroxyprogesterone     Gave her migraine for 3 months   Hydrocodone Itching     ROS All other systems reviewed and negative except where noted in HPI.   Wt Readings from Last 3 Encounters:  12/19/21 286 lb (129.7 kg)  11/29/21 281 lb (127.5 kg)  11/29/21 280 lb (127 kg)    Physical Exam   BP 120/88   Pulse 94   Ht '5\' 7"'$  (1.702 m)   Wt 265 lb (120.2 kg)   SpO2 98%   BMI 41.50 kg/m  Constitutional:  pleasant obese female in no acute distress. Psychiatric:  Normal mood and affect. Behavior is normal. EENT: Pupils normal.  Conjunctivae are normal. No scleral icterus. Neck supple.  Cardiovascular: Normal rate, regular rhythm.  Pulmonary/chest: Effort normal and breath sounds normal. No wheezing, rales or rhonchi. Abdominal: Soft, nondistended, nontender. Bowel sounds active throughout. There are no masses palpable. No hepatomegaly. Neurological: Alert and oriented to person place and time. Extremities: No edema Skin: Skin is warm and dry. No rashes noted.  Tye Savoy, NP  01/23/2022, 8:07 AM  Cc:  Referring Provider Pleas Koch, NP

## 2022-01-23 NOTE — Patient Instructions (Addendum)
_______________________________________________________  If you are age 54 or older, your body mass index should be between 23-30. Your Body mass index is 41.5 kg/m. If this is out of the aforementioned range listed, please consider follow up with your Primary Care Provider.  If you are age 32 or younger, your body mass index should be between 19-25. Your Body mass index is 41.5 kg/m. If this is out of the aformentioned range listed, please consider follow up with your Primary Care Provider.   ________________________________________________________  The [facility] providers would like to encourage you to use [facility] to communicate with providers for non-urgent requests or questions.  Due to long hold times on the telephone, sending your provider a message by [facility] may be a faster and more efficient way to get a response.  Please allow 48 business hours for a response.  Please remember that this is for non-urgent requests.  _______________________________________________________   Dennis Bast have been given a script for the diltiazem gel to take with you to the pharmacy [facility]'s information is below: Address: [address], [place], [place] [address]  [phone]  *Please DO NOT go directly from our office to pick up this medication! Give the pharmacy 1 day to process the prescription as this is compounded and takes time to make.   Take 2 colace daily at bedtime  We have sent the following medications to your pharmacy for you to pick up at your convenience: Reglan '10mg'$  take 1 hour before each dose of prep   You have been scheduled for an endoscopy and colonoscopy. Please follow the written instructions given to you at your visit today. Please pick up your prep supplies at the pharmacy within the next 1-3 days. If you use inhalers (even only as needed), please bring them with you on the day of your procedure.  It was a pleasure to see you today!  Thank you for  trusting me with your gastrointestinal care!

## 2022-01-24 ENCOUNTER — Ambulatory Visit: Payer: BC Managed Care – PPO | Admitting: Primary Care

## 2022-01-30 ENCOUNTER — Encounter: Payer: Self-pay | Admitting: Internal Medicine

## 2022-02-02 ENCOUNTER — Encounter: Payer: Self-pay | Admitting: Certified Registered Nurse Anesthetist

## 2022-02-06 ENCOUNTER — Encounter: Payer: Self-pay | Admitting: Internal Medicine

## 2022-02-06 ENCOUNTER — Ambulatory Visit (AMBULATORY_SURGERY_CENTER): Payer: BC Managed Care – PPO | Admitting: Internal Medicine

## 2022-02-06 VITALS — BP 136/83 | HR 75 | Temp 97.1°F | Resp 14 | Ht 67.0 in | Wt 265.0 lb

## 2022-02-06 DIAGNOSIS — R110 Nausea: Secondary | ICD-10-CM

## 2022-02-06 DIAGNOSIS — W44F1XA Bezoar entering into or through a natural orifice, initial encounter: Secondary | ICD-10-CM

## 2022-02-06 DIAGNOSIS — Z800 Family history of malignant neoplasm of digestive organs: Secondary | ICD-10-CM

## 2022-02-06 DIAGNOSIS — Z09 Encounter for follow-up examination after completed treatment for conditions other than malignant neoplasm: Secondary | ICD-10-CM | POA: Diagnosis present

## 2022-02-06 DIAGNOSIS — T189XXA Foreign body of alimentary tract, part unspecified, initial encounter: Secondary | ICD-10-CM | POA: Diagnosis not present

## 2022-02-06 DIAGNOSIS — Z86010 Personal history of colonic polyps: Secondary | ICD-10-CM | POA: Diagnosis not present

## 2022-02-06 DIAGNOSIS — D120 Benign neoplasm of cecum: Secondary | ICD-10-CM

## 2022-02-06 MED ORDER — SODIUM CHLORIDE 0.9 % IV SOLN: 500.0000 mL | Freq: Once | INTRAVENOUS | Status: DC

## 2022-02-06 NOTE — Progress Notes (Signed)
History and Physical Interval Note:  02/06/2022 3:03 PM  Krystal Mann  has presented today for endoscopic procedure(s), with the diagnosis of  Encounter Diagnoses  Name Primary?   Family history of colon cancer Yes   Nausea without vomiting   .  The various methods of evaluation and treatment have been discussed with the patient and/or family. After consideration of risks, benefits and other options for treatment, the patient has consented to  the endoscopic procedure(s).   The patient's history has been reviewed, patient examined, no change in status, stable for endoscopic procedure(s).  I have reviewed the patient's chart and labs.  Questions were answered to the patient's satisfaction.     Gatha Mayer, MD, [facility]

## 2022-02-06 NOTE — Progress Notes (Signed)
1508 Robinul 0.1 mg IV given due large amount of secretions upon assessment.  MD made aware, vss

## 2022-02-06 NOTE — Progress Notes (Signed)
VS by NS  Pt's states no medical or surgical changes since previsit or office visit.

## 2022-02-06 NOTE — Progress Notes (Signed)
1523 HR > 100 with esmolol 25 mg given IV, MD updated, vss

## 2022-02-06 NOTE — Op Note (Signed)
[facility] Patient Name: Oneida Mckamey Procedure Date: 02/06/2022 3:07 PM MRN: [id number] Endoscopist: Gatha Mayer , MD, [id number] Age: 52 Referring MD:  Date of Birth: September 05, 1970 Gender: Female Account #: [id number] Procedure:                Upper GI endoscopy Indications:              Nausea Medicines:                Monitored Anesthesia Care Procedure:                Pre-Anesthesia Assessment:                           - Prior to the procedure, a History and Physical                            was performed, and patient medications and                            allergies were reviewed. The patient's tolerance of                            previous anesthesia was also reviewed. The risks                            and benefits of the procedure and the sedation                            options and risks were discussed with the patient.                            All questions were answered, and informed consent                            was obtained. Prior Anticoagulants: The patient has                            taken no anticoagulant or antiplatelet agents. ASA                            Grade Assessment: III - A patient with severe                            systemic disease. After reviewing the risks and                            benefits, the patient was deemed in satisfactory                            condition to undergo the procedure.                           After obtaining informed consent, the endoscope was  passed under direct vision. Throughout the                            procedure, the patient's blood pressure, pulse, and                            oxygen saturations were monitored continuously. The                            GIF [id number] #[id number] was introduced through the                            mouth, and advanced to the second part of duodenum.                            The upper GI endoscopy was accomplished  without                            difficulty. The patient tolerated the procedure                            well. Scope In: Scope Out: Findings:                 The examined esophagus was normal.                           A small amount of a phytobezoar was found in the                            gastric body.                           The examined duodenum was normal.                           The cardia and gastric fundus were normal on                            retroflexion. Complications:            No immediate complications. Estimated Blood Loss:     Estimated blood loss: none. Impression:               - Normal esophagus.                           - A small amount of a phytobezoar in the stomach.                           - Normal examined duodenum.                           - No specimens collected. No cause of nausea found.                            Cosider anxiety/depression, inner ear disturbance  as causes. Duloxetine started last month - may be                            helping overall, would titrate dose up at PCP f/u.                            Take with food as it may cause nausea but nausea                            predates duloxetine initiation. Recommendation:           - Patient has a contact number available for                            emergencies. The signs and symptoms of potential                            delayed complications were discussed with the                            patient. Return to normal activities tomorrow.                            Written discharge instructions were provided to the                            patient.                           - Resume previous diet.                           - Continue present medications.                           - See the other procedure note for documentation of                            additional recommendations. Gatha Mayer, MD 02/06/2022 3:52:32 PM This  report has been signed electronically.

## 2022-02-06 NOTE — Patient Instructions (Addendum)
There was some retained fibrous food in the stomach - I do not think this represents a problem, necessarily. We see this sometimes in people.Otherwise ok.  The colonoscopy revealed 2 tiny polyps that I removed, and diverticulosis.  I will let you know pathology results and when to have another routine colonoscopy by mail and/or My Chart.  Bleeding could have been from diverticulosis. Nothing bleeding now.  As far as nausea - could be from stress, anxiety - it does that. I think increasing duloxetine could help. It can also help abdominal pain and IBS issues. Follow up with Pleas Koch, NP about this. I noticed that you at one time took meclizine for dizziness - sometimes inner ear problems cause nausea. Again, follow-up with Pleas Koch, NP  I appreciate the opportunity to care for you. Gatha Mayer, MD, [facility]  Handouts provided on polyps and diverticulosis.  Resume previous diet.  Continue present medications.   YOU HAD AN ENDOSCOPIC PROCEDURE TODAY AT [facility]:   Refer to the procedure report that was given to you for any specific questions about what was found during the examination.  If the procedure report does not answer your questions, please call your gastroenterologist to clarify.  If you requested that your care partner not be given the details of your procedure findings, then the procedure report has been included in a sealed envelope for you to review at your convenience later.  YOU SHOULD EXPECT: Some feelings of bloating in the abdomen. Passage of more gas than usual.  Walking can help get rid of the air that was put into your GI tract during the procedure and reduce the bloating. If you had a lower endoscopy (such as a colonoscopy or flexible sigmoidoscopy) you may notice spotting of blood in your stool or on the toilet paper. If you underwent a bowel prep for your procedure, you may not have a normal bowel movement for a few days.  Please Note:   You might notice some irritation and congestion in your nose or some drainage.  This is from the oxygen used during your procedure.  There is no need for concern and it should clear up in a day or so.  SYMPTOMS TO REPORT IMMEDIATELY:  Following lower endoscopy (colonoscopy or flexible sigmoidoscopy):  Excessive amounts of blood in the stool  Significant tenderness or worsening of abdominal pains  Swelling of the abdomen that is new, acute  Fever of 100F or higher  Following upper endoscopy (EGD)  Vomiting of blood or coffee ground material  New chest pain or pain under the shoulder blades  Painful or persistently difficult swallowing  New shortness of breath  Fever of 100F or higher  Black, tarry-looking stools  For urgent or emergent issues, a gastroenterologist can be reached at any hour by calling [phone]. Do not use MyChart messaging for urgent concerns.    DIET:  We do recommend a small meal at first, but then you may proceed to your regular diet.  Drink plenty of fluids but you should avoid alcoholic beverages for 24 hours.  ACTIVITY:  You should plan to take it easy for the rest of today and you should NOT DRIVE or use heavy machinery until tomorrow (because of the sedation medicines used during the test).    FOLLOW UP: Our staff will call the number listed on your records the next business day following your procedure.  We will call around 7:15- 8:00 am to check on  you and address any questions or concerns that you may have regarding the information given to you following your procedure. If we do not reach you, we will leave a message.     If any biopsies were taken you will be contacted by phone or by letter within the next 1-3 weeks.  Please call us at [phone] if you have not heard about the biopsies in 3 weeks.    SIGNATURES/CONFIDENTIALITY: You and/or your care partner have signed paperwork which will be entered into your electronic medical record.  These  signatures attest to the fact that that the information above on your After Visit Summary has been reviewed and is understood.  Full responsibility of the confidentiality of this discharge information lies with you and/or your care-partner.

## 2022-02-06 NOTE — Progress Notes (Signed)
Report given to PACU, vss 

## 2022-02-06 NOTE — Op Note (Addendum)
[facility] Patient Name: Krystal Mann Procedure Date: 02/06/2022 3:06 PM MRN: [id number] Endoscopist: Gatha Mayer , MD, [id number] Age: 52 Referring MD:  Date of Birth: May 19, 1970 Gender: Female Account #: [id number] Procedure:                Colonoscopy Indications:              High risk colon cancer surveillance: Personal                            history of colonic polyps + FHx CRCA and attenuated                            polyposis (2 brothers) Medicines:                Monitored Anesthesia Care Procedure:                Pre-Anesthesia Assessment:                           - Prior to the procedure, a History and Physical                            was performed, and patient medications and                            allergies were reviewed. The patient's tolerance of                            previous anesthesia was also reviewed. The risks                            and benefits of the procedure and the sedation                            options and risks were discussed with the patient.                            All questions were answered, and informed consent                            was obtained. Prior Anticoagulants: The patient has                            taken no anticoagulant or antiplatelet agents. ASA                            Grade Assessment: III - A patient with severe                            systemic disease. After reviewing the risks and                            benefits, the patient was deemed in satisfactory  condition to undergo the procedure.                           After obtaining informed consent, the colonoscope                            was passed under direct vision. Throughout the                            procedure, the patient's blood pressure, pulse, and                            oxygen saturations were monitored continuously. The                            CF [id number] #[id number] was introduced  through the anus                            and advanced to the the cecum, identified by                            appendiceal orifice and ileocecal valve. The                            colonoscopy was performed without difficulty. The                            patient tolerated the procedure well. The quality                            of the bowel preparation was excellent. The                            ileocecal valve, appendiceal orifice, and rectum                            were photographed. The bowel preparation used was                            Miralax via split dose instruction. Scope In: 3:24:05 PM Scope Out: 3:39:16 PM Scope Withdrawal Time: 0 hours 10 minutes 39 seconds  Total Procedure Duration: 0 hours 15 minutes 11 seconds  Findings:                 The perianal and digital rectal examinations were                            normal.                           Two sessile polyps were found in the cecum and                            ileocecal valve. The polyps were diminutive in  size. These polyps were removed with a cold snare.                            Resection and retrieval were complete. Verification                            of patient identification for the specimen was                            done. Estimated blood loss was minimal.                           Multiple diverticula were found in the sigmoid                            colon.                           The exam was otherwise without abnormality on                            direct and retroflexion views. Complications:            No immediate complications. Estimated Blood Loss:     Estimated blood loss was minimal. Impression:               - Two diminutive polyps in the cecum and at the                            ileocecal valve, removed with a cold snare.                            Resected and retrieved.                           - Diverticulosis in the sigmoid  colon.                           - The examination was otherwise normal on direct                            and retroflexion views. Recent abdominal pain +                            altered bowel habits and bleeding could have been                            an infectious illness, ? diverticular bleeing?? -                            has resolved                           - Personal history of colonic polyp 4 mm adenoma  2016 + FHx CRCA in mother + brothers x 2 w/                            attenuated polyposis in at least 1. Recommendation:           - Patient has a contact number available for                            emergencies. The signs and symptoms of potential                            delayed complications were discussed with the                            patient. Return to normal activities tomorrow.                            Written discharge instructions were provided to the                            patient.                           - Resume previous diet.                           - Continue present medications.                           - Await pathology results.                           - Repeat colonoscopy is recommended. The                            colonoscopy date will be determined after pathology                            results from today's exam become available for                            review. Gatha Mayer, MD 02/06/2022 3:58:27 PM This report has been signed electronically.

## 2022-02-07 ENCOUNTER — Telehealth: Payer: Self-pay | Admitting: *Deleted

## 2022-02-07 NOTE — Telephone Encounter (Signed)
  Follow up Call-     02/06/2022    2:37 PM  Call back number  Post procedure Call Back phone  # [phone]  Permission to leave phone message Yes     Patient questions:  Do you have a fever, pain , or abdominal swelling? No. Pain Score  0 *  Have you tolerated food without any problems? Yes.    Have you been able to return to your normal activities? Yes.    Do you have any questions about your discharge instructions: Diet   No. Medications  No. Follow up visit  No.  Do you have questions or concerns about your Care? No.  Actions: * If pain score is 4 or above: No action needed, pain <4.

## 2022-02-14 ENCOUNTER — Ambulatory Visit: Payer: BC Managed Care – PPO | Admitting: Primary Care

## 2022-02-14 ENCOUNTER — Encounter: Payer: Self-pay | Admitting: Primary Care

## 2022-02-14 VITALS — BP 106/78 | HR 85 | Temp 97.6°F | Ht 67.0 in | Wt 275.0 lb

## 2022-02-14 DIAGNOSIS — Z800 Family history of malignant neoplasm of digestive organs: Secondary | ICD-10-CM | POA: Diagnosis not present

## 2022-02-14 DIAGNOSIS — R519 Headache, unspecified: Secondary | ICD-10-CM

## 2022-02-14 DIAGNOSIS — G43909 Migraine, unspecified, not intractable, without status migrainosus: Secondary | ICD-10-CM | POA: Insufficient documentation

## 2022-02-14 DIAGNOSIS — G43009 Migraine without aura, not intractable, without status migrainosus: Secondary | ICD-10-CM

## 2022-02-14 DIAGNOSIS — F331 Major depressive disorder, recurrent, moderate: Secondary | ICD-10-CM

## 2022-02-14 DIAGNOSIS — G8929 Other chronic pain: Secondary | ICD-10-CM

## 2022-02-14 DIAGNOSIS — M2550 Pain in unspecified joint: Secondary | ICD-10-CM

## 2022-02-14 MED ORDER — KETOROLAC TROMETHAMINE 60 MG/2ML IM SOLN
60.0000 mg | Freq: Once | INTRAMUSCULAR | Status: AC
  Administered 2022-02-14: 60 mg via INTRAMUSCULAR

## 2022-02-14 MED ORDER — DULOXETINE HCL 60 MG PO CPEP: 60.0000 mg | ORAL_CAPSULE | Freq: Every day | ORAL | 0 refills | Status: DC

## 2022-02-14 MED ORDER — SUMATRIPTAN SUCCINATE 50 MG PO TABS: ORAL_TABLET | ORAL | 0 refills | Status: DC

## 2022-02-14 MED ORDER — AMITRIPTYLINE HCL 10 MG PO TABS: 10.0000 mg | ORAL_TABLET | Freq: Every day | ORAL | 0 refills | Status: DC

## 2022-02-14 NOTE — Assessment & Plan Note (Signed)
Uncontrolled and frequent.  Start amitriptyline 10 mg HS. Drowsiness precautions provided.  Start sumatriptan 50 mg PRN.   Follow up in 1 month.
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
## 2022-02-14 NOTE — Assessment & Plan Note (Signed)
Slight improvement, appears worse today.  Increase Cymbalta to 60 mg daily. Adding amitriptyline 10 mg HS for migraines and pain.  Follow up in 1 month.  Agree to proceed with continuous FMLA from November 23, 2021 through January 12, 2022. Agree for intermittent FMLA for January 13, 2022 through April 13, 2022.  She will have employer send paperwork.

## 2022-02-14 NOTE — Assessment & Plan Note (Signed)
Reviewed colonoscopy from January 2024. Awaiting recall date.

## 2022-02-14 NOTE — Assessment & Plan Note (Signed)
No improvement.  Increase Cymbalta to 60 mg daily. Adding amitriptyline 10 mg HS for migraine prevention and pain.   Follow up in 1 month.

## 2022-02-14 NOTE — Assessment & Plan Note (Addendum)
Uncontrolled.  IM Toradol 60 mg provided today. Start amitriptyline 10 mg HS for prevention. Start sumatriptan 50 mg PRN.  Follow up in 1 month.

## 2022-02-14 NOTE — Patient Instructions (Signed)
We increased the dose of your Cymbalta to 60 mg daily. I sent a new prescription to your pharmacy. This is for depression and joint pain.  Start amitriptyline 10 mg at bedtime for headache prevention.   Start sumatriptan (Imitrex) 50 mg for migraine abortion. Take 1 tablet at migraine onset. May take second tablet in 2 hours if migraine persists. Do not exceed 2 tablets in 24 hours.  Please try to get in with therapy.  Have your employer send [place] FMLA paperwork.  Schedule a follow up visit for 1 month.  It was a pleasure to see you today!

## 2022-02-19 ENCOUNTER — Encounter: Payer: Self-pay | Admitting: Internal Medicine

## 2022-02-19 DIAGNOSIS — Z860101 Personal history of adenomatous and serrated colon polyps: Secondary | ICD-10-CM

## 2022-02-19 DIAGNOSIS — Z86010 Personal history of colonic polyps: Secondary | ICD-10-CM | POA: Insufficient documentation

## 2022-02-19 HISTORY — DX: Personal history of adenomatous and serrated colon polyps: Z86.0101

## 2022-02-20 ENCOUNTER — Other Ambulatory Visit: Payer: Self-pay

## 2022-02-20 ENCOUNTER — Telehealth: Payer: Self-pay | Admitting: Genetic Counselor

## 2022-02-20 DIAGNOSIS — Z800 Family history of malignant neoplasm of digestive organs: Secondary | ICD-10-CM

## 2022-02-20 NOTE — Telephone Encounter (Signed)
Scheduled appt per 2/8 referral. Pt is aware of appt date and time. Pt is aware to arrive 15 mins prior to appt time and to bring and updated insurance card. Pt is aware of appt location.   

## 2022-02-24 ENCOUNTER — Telehealth: Payer: Self-pay | Admitting: Primary Care

## 2022-02-24 NOTE — Telephone Encounter (Signed)
Type of forms received: FMLA  Routed to:[employer] received by :    Individual made aware of 3-5 business day turn around (Y/N): Y  Form completed and patient made aware of charges(Y/N): Y   Faxed to :   Form location:  Place in [employer] folder

## 2022-02-25 NOTE — Telephone Encounter (Signed)
Completed and placed on Kate's desk 

## 2022-02-25 NOTE — Telephone Encounter (Signed)
Patient dropped off FMLA form.  This is to be filled out from appt on 02/14/22.  Form has been placed in your inbox for completion and signing.  Due date 03/04/22.

## 2022-02-26 NOTE — Telephone Encounter (Signed)
Completed form has been faxed to fax# [phone] Attn:   Charma Igo.  We received fax confirmation.  Copies made for scanning, patient, and myself.  Patient's copy placed in outgoing mail per request.  Patient notified over phone this is complete.

## 2022-03-07 ENCOUNTER — Telehealth (INDEPENDENT_AMBULATORY_CARE_PROVIDER_SITE_OTHER): Payer: BC Managed Care – PPO | Admitting: Nurse Practitioner

## 2022-03-07 ENCOUNTER — Encounter: Payer: Self-pay | Admitting: Nurse Practitioner

## 2022-03-07 VITALS — Temp 98.0°F | Ht 67.0 in | Wt 275.0 lb

## 2022-03-07 DIAGNOSIS — R051 Acute cough: Secondary | ICD-10-CM | POA: Diagnosis not present

## 2022-03-07 DIAGNOSIS — R062 Wheezing: Secondary | ICD-10-CM | POA: Insufficient documentation

## 2022-03-07 DIAGNOSIS — J029 Acute pharyngitis, unspecified: Secondary | ICD-10-CM | POA: Diagnosis not present

## 2022-03-07 DIAGNOSIS — G4489 Other headache syndrome: Secondary | ICD-10-CM | POA: Diagnosis not present

## 2022-03-07 LAB — POCT RAPID STREP A (OFFICE): Rapid Strep A Screen: NEGATIVE

## 2022-03-07 MED ORDER — ALBUTEROL SULFATE HFA 108 (90 BASE) MCG/ACT IN AERS: 2.0000 | INHALATION_SPRAY | Freq: Four times a day (QID) | RESPIRATORY_TRACT | 0 refills | Status: DC | PRN

## 2022-03-07 MED ORDER — PROMETHAZINE-DM 6.25-15 MG/5ML PO SYRP: 5.0000 mL | ORAL_SOLUTION | Freq: Four times a day (QID) | ORAL | 0 refills | Status: DC | PRN

## 2022-03-07 NOTE — Patient Instructions (Signed)
Nice to see you today. I have sent in an albuterol inhaler to use as needed and cough medication.  The cough medication may make you sleepy so use caution.  Follow-up if no improvement

## 2022-03-07 NOTE — Assessment & Plan Note (Signed)
Rest drink plenty of fluid.  Over-the-counter analgesics as needed pending strep test, flu test, COVID test

## 2022-03-07 NOTE — Assessment & Plan Note (Signed)
Will send in albuterol inhaler to use on a as needed basis.  Pending flu, COVID, RSV test

## 2022-03-07 NOTE — Assessment & Plan Note (Signed)
Or outpatient promethazine-dextromethorphan cough medications sedation precautions reviewed pending COVID, flu, RSV testing

## 2022-03-07 NOTE — Assessment & Plan Note (Signed)
History of migraines.  Patient continues an over-the-counter analgesics as needed.  Rest, drink plenty of fluids.  Pending COVID flu RSV testing

## 2022-03-07 NOTE — Progress Notes (Signed)
Patient ID: Krystal Mann, female    DOB: July 22, 1970, 52 y.o.   MRN: [id number]  Virtual visit completed through [place], a video enabled telemedicine application. Due to national recommendations of social distancing due to COVID-19, a virtual visit is felt to be most appropriate for this patient at this time. Reviewed limitations, risks, security and privacy concerns of performing a virtual visit and the availability of in person appointments. I also reviewed that there may be a patient responsible charge related to this service. The patient agreed to proceed.   Patient location: home Provider location: [place] at [facility], office Persons participating in this virtual visit: patient, provider   If any vitals were documented, they were collected by patient at home unless specified below.    Temp 98 F (36.7 C)   Ht '5\' 7"'$  (1.702 m)   Wt 275 lb (124.7 kg)   BMI 43.07 kg/m    CC: cough Subjective:   HPI: Krystal Mann is a 52 y.o. female presenting on 03/07/2022 for Cough, Sore Throat (X Sunday have not taken a covid test.), and Wheezing    Symptoms started on Sunday Sick contacts: none that she is aware of. 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 Covid vaccine: pfizer x2 Flu vaccine: Not up-to-date States that she took Theraflu that did not help    Relevant past medical, surgical, family and social history reviewed and updated as indicated. Interim medical history since our last visit reviewed. Allergies and medications reviewed and updated. Outpatient Medications Prior to Visit  Medication Sig Dispense Refill   acetaminophen (TYLENOL) 325 MG tablet Take 650 mg by mouth. 6A, 12N, 6P, 12MN     AMBULATORY NON FORMULARY MEDICATION Medication Name: Diltiazem gel 2% apply every 8 hours as directed for 6 weeks 30 g 1   amitriptyline (ELAVIL) 10 MG tablet Take 1 tablet (10 mg total) by mouth at bedtime. For headache prevention  and joint pain 90 tablet 0   DULoxetine (CYMBALTA) 60 MG capsule Take 1 capsule (60 mg total) by mouth daily. For depression and joint pain 90 capsule 0   SUMAtriptan (IMITREX) 50 MG tablet Take 1 tablet by mouth at migraine onset. May repeat in 2 hours if headache persists or recurs. Do not exceed 2 tablets in 24 hours. 10 tablet 0   No facility-administered medications prior to visit.     Per HPI unless specifically indicated in ROS section below Review of Systems  Constitutional:  Positive for fatigue. Negative for chills and fever.  HENT:  Positive for sore throat. Negative for ear discharge, ear pain, sinus pressure and sinus pain.   Respiratory:  Positive for cough, shortness of breath (with coughing jags) and wheezing.   Gastrointestinal:  Positive for nausea. Negative for abdominal pain, diarrhea and vomiting.  Neurological:  Positive for headaches.   Objective:  Temp 98 F (36.7 C)   Ht '5\' 7"'$  (1.702 m)   Wt 275 lb (124.7 kg)   BMI 43.07 kg/m   Wt Readings from Last 3 Encounters:  03/07/22 275 lb (124.7 kg)  02/14/22 275 lb (124.7 kg)  02/06/22 265 lb (120.2 kg)       Physical exam: Gen: alert, NAD, not ill appearing Pulm: speaks in complete sentences without increased work of breathing Psych: normal mood, normal thought content      Results for orders placed or performed in visit on 12/19/21  C-reactive protein  Result Value Ref Range  CRP <1.0 0.5 - 0000000 mg/dL  Cyclic citrul peptide antibody, IgG  Result Value Ref Range   Cyclic Citrullin Peptide Ab <16 UNITS  Rheumatoid factor  Result Value Ref Range   Rhuematoid fact SerPl-aCnc <14 <14 IU/mL  Sedimentation rate  Result Value Ref Range   Sed Rate 62 (H) 0 - 30 mm/hr  Hemoglobin A1c  Result Value Ref Range   Hgb A1c MFr Bld 6.3 4.6 - 6.5 %  Uric acid  Result Value Ref Range   Uric Acid, Serum 7.0 2.4 - 7.0 mg/dL   Assessment & Plan:   Acute cough Assessment & Plan: Or outpatient  promethazine-dextromethorphan cough medications sedation precautions reviewed pending COVID, flu, RSV testing  Orders: -     COVID-19, Flu A+B and RSV; Future -     Promethazine-DM; Take 5 mLs by mouth 4 (four) times daily as needed for cough.  Dispense: 118 mL; Refill: 0  Sore throat Assessment & Plan: Rest drink plenty of fluid.  Over-the-counter analgesics as needed pending strep test, flu test, COVID test  Orders: -     COVID-19, Flu A+B and RSV; Future -     POCT rapid strep A; Future  Other headache syndrome Assessment & Plan: History of migraines.  Patient continues an over-the-counter analgesics as needed.  Rest, drink plenty of fluids.  Pending COVID flu RSV testing  Orders: -     COVID-19, Flu A+B and RSV; Future  Wheezing Assessment & Plan: Will send in albuterol inhaler to use on a as needed basis.  Pending flu, COVID, RSV test  Orders: -     Albuterol Sulfate HFA; Inhale 2 puffs into the lungs every 6 (six) hours as needed for wheezing or shortness of breath.  Dispense: 8 g; Refill: 0     I discussed the assessment and treatment plan with the patient. The patient was provided an opportunity to ask questions and all were answered. The patient agreed with the plan and demonstrated an understanding of the instructions. The patient was advised to call back or seek an in-person evaluation if the symptoms worsen or if the condition fails to improve as anticipated.  Follow up plan: Return if symptoms worsen or fail to improve.  Romilda Garret, NP

## 2022-03-09 LAB — COVID-19, FLU A+B AND RSV
Influenza A, NAA: NOT DETECTED
Influenza B, NAA: NOT DETECTED
RSV, NAA: NOT DETECTED
SARS-CoV-2, NAA: NOT DETECTED

## 2022-03-17 ENCOUNTER — Ambulatory Visit: Payer: BC Managed Care – PPO | Admitting: Primary Care

## 2022-04-14 ENCOUNTER — Inpatient Hospital Stay: Payer: BC Managed Care – PPO | Attending: Nurse Practitioner | Admitting: Genetic Counselor

## 2022-04-14 ENCOUNTER — Inpatient Hospital Stay: Payer: BC Managed Care – PPO

## 2022-04-14 ENCOUNTER — Other Ambulatory Visit: Payer: Self-pay

## 2022-04-14 DIAGNOSIS — Z800 Family history of malignant neoplasm of digestive organs: Secondary | ICD-10-CM | POA: Diagnosis not present

## 2022-04-14 DIAGNOSIS — Z83719 Family history of colon polyps, unspecified: Secondary | ICD-10-CM

## 2022-04-15 ENCOUNTER — Encounter: Payer: Self-pay | Admitting: Genetic Counselor

## 2022-04-15 DIAGNOSIS — Z83719 Family history of colon polyps, unspecified: Secondary | ICD-10-CM | POA: Insufficient documentation

## 2022-04-15 NOTE — Progress Notes (Signed)
REFERRING PROVIDER: Gatha Mayer, MD [address],  [place] [address]  PRIMARY PROVIDER:  Pleas Koch, NP  PRIMARY REASON FOR VISIT:  1. Family history of colon cancer   2. Family history of colonic polyps     HISTORY OF PRESENT ILLNESS:   Krystal Mann, a 52 y.o. female, was seen for a [place] cancer genetics consultation at the request of Dr. Carlean Purl due to a family history of cancer and polyps.  Krystal Mann presents to clinic today to discuss the possibility of a hereditary predisposition to cancer, to discuss genetic testing, and to further clarify her future cancer risks, as well as potential cancer risks for family members.   Krystal Mann is a 52 y.o. female with no personal history of cancer. Krystal Mann has had two colonoscopies. Her first colonoscopy in 2016 identified one tubular adenoma and her second colonoscopy in 2024 identified two tubular adenomas.    Past Medical History:  Diagnosis Date   Anemia    Arthritis    knee   Axillary abscess 06/13/2014   Back pain    Breast lump on right side at 9 o'clock position 07/27/2012   Referred patient to the [facility] for diagnostic mammogram and right breast ultrasound. Appointment scheduled for Wednesday, August 04, 2012 at 1510.    Chicken pox    Depression 2004   Diverticulitis    Hx of adenomatous colonic polyps 02/19/2022   Migraines    no longer has them. Was medication related   Rotator cuff (capsule) sprain    right   Seasonal allergies    Shortness of breath dyspnea    occasionally    Past Surgical History:  Procedure Laterality Date   APPLICATION OF A-CELL OF CHEST/ABDOMEN Bilateral 12/31/2015   Procedure: APPLICATION OF A-CELL OF BILATERAL AXILLA;  Surgeon: Coralie Keens, MD;  Location: [place];  Service: General;  Laterality: Bilateral;   BREAST CYST ASPIRATION  2014   CERVICAL DISCECTOMY  2012   HYDRADENITIS EXCISION Bilateral 12/31/2015   Procedure: WIDE  EXCISION BILATERAL AXILLARY HIDRADENITIS WITH ACELL APPLICATION;  Surgeon: Coralie Keens, MD;  Location: [place];  Service: General;  Laterality: Bilateral;   JOINT REPLACEMENT     TOTAL KNEE ARTHROPLASTY Right 06/18/2015   Procedure: TOTAL KNEE ARTHROPLASTY;  Surgeon: Frederik Pear, MD;  Location: [place];  Service: Orthopedics;  Laterality: Right;   TUBAL LIGATION      Social History   Socioeconomic History   Marital status: Single    Spouse name: Not on file   Number of children: 2   Years of education: Not on file   Highest education level: Some college, no degree  Occupational History   Occupation: student bus driver   Occupation: customer service -road side assistance  Tobacco Use   Smoking status: Never   Smokeless tobacco: Never  Vaping Use   Vaping Use: Never used  Substance and Sexual Activity   Alcohol use: Yes    Alcohol/week: 0.0 standard drinks of alcohol    Comment: social   Drug use: No   Sexual activity: Yes    Partners: Male    Birth control/protection: Surgical    Comment: tubial ligation  Other Topics Concern   Not on file  Social History Narrative   Single.   2 Children, 1 grandson.   Works as a Teacher, early years/pre.   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  Social Determinants of Health   Financial Resource Strain: Not on file  Food Insecurity: Not on file  Transportation Needs: No Transportation Needs (02/18/2018)   PRAPARE - Hydrologist (Medical): No    Lack of Transportation (Non-Medical): No  Physical Activity: Not on file  Stress: Not on file  Social Connections: Not on file     FAMILY HISTORY:  We obtained a detailed, 4-generation family history.  Significant diagnoses are listed below: Family History  Problem Relation Age of Onset   Colon cancer Mother 50   Kidney cancer Mother 22   Diabetes Mother    Hyperlipidemia Mother    Melanoma Mother    Lung cancer Father  25 - 52   Diabetes Sister    Asthma Sister    Colon cancer Brother 23   Colon polyps Brother        >40 polyps   Colon cancer Brother 44   Colon polyps Brother        94 polyps   Cancer Maternal Uncle    Cancer Paternal Uncle        unknown types   Esophageal cancer Neg Hx    Rectal cancer Neg Hx    Stomach cancer Neg Hx          Krystal Mann has two brothers. One brother was diagnosed with colon cancer at age 6, he had a history of eight colon polyps and died at age 19. Her second brother was diagnosed with colon cancer at age 75, he has a history of >40 colon polyps, he had a total colectomy. She does not know if he had genetic testing. She reports one of her sisters had genetic testing but she does not know the results. Krystal Mann mother was diagnosed with kidney cancer at age 87, melanoma at an unknown age, and colon cancer at age 44. One maternal uncle and one maternal first cousin have a history of cancer (unknown types). Her father died due to lung cancer at age 19. Krystal Mann has 5 paternal uncles and all reportedly had cancer (unknown types), they are all deceased. There is no reported Ashkenazi Jewish ancestry.   GENETIC COUNSELING ASSESSMENT: Krystal Mann is a 52 y.o. female with a family history of colon cancer and polyposis which is somewhat suggestive of a hereditary predisposition to cancer. We, therefore, discussed and recommended the following at today's visit.   DISCUSSION: We discussed that 5 - 10% of cancer is hereditary, with most cases of hereditary polyposis associated with APC and MUTYH.  There are other genes that can be associated with hereditary polyposis/cancer syndromes.  We discussed that testing is beneficial for several reasons, including knowing about other cancer risks, identifying potential screening and risk-reduction options that may be appropriate, and to understanding if other family members could be at risk for cancer and allowing them to undergo genetic  testing.  We reviewed the genetic testing Krystal Mann had through [facility] [facility] ordered by Darron Doom, MD (her OB/Gyn) in 2018. Krystal Mann had negative [facility] MyRisk Panel in 2018.        Even though a pathogenic variant was not identified, possible explanations for the polyposis/cancer in the family may include: There may be no hereditary risk for polyposis/cancer in the family. The polyposis/cancers in her family may be due to other genetic or environmental factors. There may be a gene mutation in one of these genes that testing methods could not detect There could be another  gene that has not yet been discovered, or that we have not yet tested, that is responsible for the polyposis/cancer diagnoses in the family.  It is also possible there is a hereditary cause for the polyposis/cancer in the family that Krystal Mann did not inherit.  We discussed with Krystal Mann that her genetic testing was fairly extensive. However, labs such as [facility] and [facility] now offer RNA analysis which could potentially identify a mutation not identified on [facility] Panel. We discussed the pros and cons of Krystal Mann proceeding with a more comprehensive gene panel that includes RNA analysis and additional genes vs. discussing testing with her brother and mother. We would recommend her brother and mother pursue genetic testing before Krystal Mann proceeds with updated testing. If they have genetic testing and a mutation is identified in a gene that explains the family history of polyposis/cancer, Krystal Mann may not need updated testing. We can compare their results with the genetic testing she had in 2018 to see if she tested negative for a familial gene mutation. Krystal Mann ultimately decided to speak with her family members and see if they will pursue genetic testing.  PLAN: Based on Krystal Mann's family history and prior negative genetic testing through [facility], we recommended her brother and mother have genetic testing. Ms.  Mann will let us know if we can be of any assistance in coordinating genetic counseling and/or testing for this family member.   Ms. Justus questions were answered to her satisfaction today. Our contact information was provided should additional questions or concerns arise. Thank you for the referral and allowing [place] to share in the care of your patient.   Lucille Passy, MS, [facility] Genetic Counselor [email or web address] (P) [phone]  The patient was seen for a total of 40 minutes in face-to-face genetic counseling. The patient was seen alone.  Drs. Lindi Adie and/or Burr Medico were available to discuss this case as needed.  _______________________________________________________________________ For Office Staff:  Number of people involved in session: 1 Was an Intern/ student involved with case: yes, [facility]

## 2022-05-15 ENCOUNTER — Other Ambulatory Visit: Payer: Self-pay | Admitting: Primary Care

## 2022-05-15 DIAGNOSIS — R519 Headache, unspecified: Secondary | ICD-10-CM

## 2022-05-15 DIAGNOSIS — G43009 Migraine without aura, not intractable, without status migrainosus: Secondary | ICD-10-CM

## 2022-05-15 MED ORDER — SUMATRIPTAN SUCCINATE 50 MG PO TABS
ORAL_TABLET | ORAL | 0 refills | Status: DC
Start: 2022-05-15 — End: 2023-02-17

## 2022-05-15 NOTE — Telephone Encounter (Signed)
Please call patient:  How are her headaches since we started the amitriptyline 10 mg tablets at bedtime for headache prevention?  Is she taking this?  Received refill request from her pharmacy.

## 2022-05-15 NOTE — Telephone Encounter (Signed)
Patient has been taking every night and has had a lot of improvement with symptoms. She is still getting but not as much she also need refill Imitrex as well. She has only needed once after starting the amitriptyline one time but did not have any.

## 2022-05-15 NOTE — Telephone Encounter (Signed)
Noted. Refill(s) sent to pharmacy.  

## 2022-05-20 NOTE — Telephone Encounter (Signed)
Krystal Mann, please fax form. Placed in your inbox.

## 2022-05-20 NOTE — Telephone Encounter (Signed)
Krystal Mann, please fax letter to number below.

## 2022-05-22 ENCOUNTER — Other Ambulatory Visit: Payer: Self-pay | Admitting: Primary Care

## 2022-05-22 DIAGNOSIS — G8929 Other chronic pain: Secondary | ICD-10-CM

## 2022-05-22 DIAGNOSIS — F331 Major depressive disorder, recurrent, moderate: Secondary | ICD-10-CM

## 2022-09-04 ENCOUNTER — Other Ambulatory Visit: Payer: Self-pay | Admitting: Primary Care

## 2022-09-04 DIAGNOSIS — G8929 Other chronic pain: Secondary | ICD-10-CM

## 2022-09-04 DIAGNOSIS — F331 Major depressive disorder, recurrent, moderate: Secondary | ICD-10-CM

## 2022-12-06 ENCOUNTER — Other Ambulatory Visit: Payer: Self-pay | Admitting: Primary Care

## 2022-12-06 DIAGNOSIS — F331 Major depressive disorder, recurrent, moderate: Secondary | ICD-10-CM

## 2022-12-06 DIAGNOSIS — G8929 Other chronic pain: Secondary | ICD-10-CM

## 2023-02-06 ENCOUNTER — Telehealth: Payer: Self-pay

## 2023-02-06 NOTE — Telephone Encounter (Signed)
Please call patient past due for follow up in office with Jae Dire.

## 2023-02-06 NOTE — Telephone Encounter (Signed)
Patient has been scheduled

## 2023-02-13 ENCOUNTER — Ambulatory Visit: Payer: Self-pay | Admitting: Primary Care

## 2023-02-17 ENCOUNTER — Ambulatory Visit
Admission: RE | Admit: 2023-02-17 | Discharge: 2023-02-17 | Disposition: A | Discharge status: Home / Self Care | Payer: 59 | Source: Ambulatory Visit | Attending: Primary Care

## 2023-02-17 ENCOUNTER — Other Ambulatory Visit (HOSPITAL_COMMUNITY)
Admission: RE | Admit: 2023-02-17 | Discharge: 2023-02-17 | Disposition: A | Discharge status: Home / Self Care | Payer: 59 | Source: Ambulatory Visit | Attending: Primary Care | Admitting: Primary Care

## 2023-02-17 ENCOUNTER — Ambulatory Visit: Payer: 59 | Admitting: Primary Care

## 2023-02-17 ENCOUNTER — Other Ambulatory Visit: Payer: Self-pay | Admitting: Primary Care

## 2023-02-17 VITALS — BP 108/70 | HR 82 | Temp 97.2°F | Ht 67.0 in | Wt 291.0 lb

## 2023-02-17 DIAGNOSIS — M25561 Pain in right knee: Secondary | ICD-10-CM | POA: Diagnosis not present

## 2023-02-17 DIAGNOSIS — R7303 Prediabetes: Secondary | ICD-10-CM

## 2023-02-17 DIAGNOSIS — M25562 Pain in left knee: Secondary | ICD-10-CM

## 2023-02-17 DIAGNOSIS — Z1231 Encounter for screening mammogram for malignant neoplasm of breast: Secondary | ICD-10-CM

## 2023-02-17 DIAGNOSIS — Z0001 Encounter for general adult medical examination with abnormal findings: Secondary | ICD-10-CM

## 2023-02-17 DIAGNOSIS — M5441 Lumbago with sciatica, right side: Secondary | ICD-10-CM

## 2023-02-17 DIAGNOSIS — F331 Major depressive disorder, recurrent, moderate: Secondary | ICD-10-CM

## 2023-02-17 DIAGNOSIS — G43009 Migraine without aura, not intractable, without status migrainosus: Secondary | ICD-10-CM

## 2023-02-17 DIAGNOSIS — Z124 Encounter for screening for malignant neoplasm of cervix: Secondary | ICD-10-CM | POA: Diagnosis present

## 2023-02-17 DIAGNOSIS — M5442 Lumbago with sciatica, left side: Secondary | ICD-10-CM

## 2023-02-17 DIAGNOSIS — M542 Cervicalgia: Secondary | ICD-10-CM

## 2023-02-17 DIAGNOSIS — G8929 Other chronic pain: Secondary | ICD-10-CM

## 2023-02-17 DIAGNOSIS — Z23 Encounter for immunization: Secondary | ICD-10-CM | POA: Diagnosis not present

## 2023-02-17 DIAGNOSIS — R519 Headache, unspecified: Secondary | ICD-10-CM

## 2023-02-17 DIAGNOSIS — M2550 Pain in unspecified joint: Secondary | ICD-10-CM

## 2023-02-17 LAB — CBC
HCT: 38.6 % (ref 36.0–46.0)
Hemoglobin: 12.8 g/dL (ref 12.0–15.0)
MCHC: 33.2 g/dL (ref 30.0–36.0)
MCV: 86.7 fL (ref 78.0–100.0)
Platelets: 268 10*3/uL (ref 150.0–400.0)
RBC: 4.46 Mil/uL (ref 3.87–5.11)
RDW: 14.8 % (ref 11.5–15.5)
WBC: 3.9 10*3/uL — ABNORMAL LOW (ref 4.0–10.5)

## 2023-02-17 LAB — LIPID PANEL
Cholesterol: 122 mg/dL (ref 0–200)
HDL: 44.8 mg/dL (ref >39.00)
LDL Cholesterol: 60 mg/dL (ref 0–99)
NonHDL: 76.75
Total CHOL/HDL Ratio: 3
Triglycerides: 86 mg/dL (ref 0.0–149.0)
VLDL: 17.2 mg/dL (ref 0.0–40.0)

## 2023-02-17 LAB — COMPREHENSIVE METABOLIC PANEL
ALT: 11 U/L (ref 0–35)
AST: 15 U/L (ref 0–37)
Albumin: 3.9 g/dL (ref 3.5–5.2)
Alkaline Phosphatase: 112 U/L (ref 39–117)
BUN: 8 mg/dL (ref 6–23)
CO2: 30 meq/L (ref 19–32)
Calcium: 8.8 mg/dL (ref 8.4–10.5)
Chloride: 101 meq/L (ref 96–112)
Creatinine, Ser: 0.66 mg/dL (ref 0.40–1.20)
GFR: 99.44 mL/min (ref >60.00)
Glucose, Bld: 100 mg/dL — ABNORMAL HIGH (ref 70–99)
Potassium: 4 meq/L (ref 3.5–5.1)
Sodium: 138 meq/L (ref 135–145)
Total Bilirubin: 0.6 mg/dL (ref 0.2–1.2)
Total Protein: 7.1 g/dL (ref 6.0–8.3)

## 2023-02-17 LAB — HEMOGLOBIN A1C: Hgb A1c MFr Bld: 6.4 % (ref 4.6–6.5)

## 2023-02-17 MED ORDER — SUMATRIPTAN SUCCINATE 50 MG PO TABS: ORAL_TABLET | ORAL | 0 refills | Status: DC

## 2023-02-17 MED ORDER — AMITRIPTYLINE HCL 25 MG PO TABS
25.0000 mg | ORAL_TABLET | Freq: Every day | ORAL | 3 refills | Status: AC
Start: 2023-02-17 — End: ?

## 2023-02-17 NOTE — Assessment & Plan Note (Signed)
Improving.  Increase amitriptyline to 25 mg at bedtime. She will update.

## 2023-02-17 NOTE — Patient Instructions (Addendum)
 Stop by the lab and xray prior to leaving today. I will notify you of your results once received.   Call to schedule your mammogram.  You will either be contacted via phone regarding your referral to orthopedics, or you may receive a letter on your MyChart portal from our referral team with instructions for scheduling an appointment. Please let us  know if you have not been contacted by anyone within two weeks.  We increased your amitriptyline  medication to 25 mg.  This is to help with migraines and joint pain.  It was a pleasure to see you today!

## 2023-02-17 NOTE — Assessment & Plan Note (Addendum)
Deteriorated with marked decrease in mobility.  X-rays of the bilateral knees ordered and pending. Referral placed to orthopedics.  Handicap placard renewed.

## 2023-02-17 NOTE — Assessment & Plan Note (Signed)
 Repeat A1c pending

## 2023-02-17 NOTE — Assessment & Plan Note (Signed)
Negative for autoimmune arthritis and gout.  Continue Cymbalta 60 mg daily. Increase amitriptyline to 25 mg at bedtime.

## 2023-02-17 NOTE — Assessment & Plan Note (Signed)
With bilateral upper extremity radiculopathy.  X-rays of the cervical spine ordered and pending. Referral placed orthopedics.

## 2023-02-17 NOTE — Assessment & Plan Note (Signed)
-   Improving  - Continue Cymbalta 60 mg daily

## 2023-02-17 NOTE — Assessment & Plan Note (Signed)
 Declines all vaccines. Pap smear due, completed today Mammogram due, orders placed. Colonoscopy UTD, due 2027  Discussed the importance of a healthy diet and regular exercise in order for weight loss, and to reduce the risk of further co-morbidity.  Exam stable. Labs pending.  Follow up in 1 year for repeat physical.

## 2023-02-17 NOTE — Assessment & Plan Note (Addendum)
X-rays of the lumbar spine ordered and pending.  Referral placed to orthopedics.  Handicap placard renewed.

## 2023-02-17 NOTE — Progress Notes (Signed)
 Subjective:    Patient ID: Krystal Mann, female    DOB: 30-Dec-1970, 53 y.o.   MRN: [id number]  HPI  Krystal Mann is a very pleasant 53 y.o. female who presents today for complete physical and follow up of chronic conditions.  She continues to experience chronic migraines which are occurring 2-3 times monthly, but improved prior to initiation of amitriptyline  10 mg at bedtime. Migraines are located to the left frontal lobe and behind her left eye. She is also managed on Imitrex  which has helped with migraine abortion.  She is also requesting to file permanent disability. She is requesting disability due to chronic bilateral knee pain with immobility which has progressed over the years. Also with chronic bilateral back pain with radiation of pain to her lower extremities, chronic bilateral shoulder pain with radiation down to her bilateral upper extremities to her fingers. She cannot sit longer 30 minutes and cannot stand longer than 5 minutes without pain. 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 She has difficulty driving, getting up and down from the bus. She also has difficulty with all household chores, cooking, driving in general. She primarily uses a cane for support, has fallen at work, outside of her home, in the shower at home. She is managed on amitriptyline  and Cymbalta  which have helped some with pain. She underwent lab work in December 2023 which was negative for autoimmune arthritis. She underwent a right total knee replacement several years ago.  She completed physical therapy in 2022 for her chronic knee pain, no improvement.  Immunizations: -Tetanus: Completed > 10 years ago.  -Influenza: Declines influenza vaccine. -Shingles: Never completed.   Diet: Fair diet.  Exercise: No regular exercise.  Eye exam: Completed years ago  Dental exam: Completed years ago   Pap Smear: January 2020 per GYN Mammogram: February 2020 per GYN  Colonoscopy:  Completed in 2024, due 2027  BP Readings from Last 3 Encounters:  02/17/23 108/70  02/14/22 106/78  02/06/22 136/83       Review of Systems  Constitutional:  Negative for unexpected weight change.  HENT:  Negative for rhinorrhea.   Respiratory:  Negative for cough and shortness of breath.   Cardiovascular:  Negative for chest pain.  Gastrointestinal:  Negative for constipation and diarrhea.  Genitourinary:  Negative for difficulty urinating.  Musculoskeletal:  Positive for arthralgias.  Skin:  Negative for rash.  Allergic/Immunologic: Negative for environmental allergies.  Neurological:  Negative for dizziness, numbness and headaches.  Psychiatric/Behavioral:  The patient is not nervous/anxious.          Past Medical History:  Diagnosis Date   Anemia    Arthritis    knee   Axillary abscess 06/13/2014   Back pain    Breast lump on right side at 9 o'clock position 07/27/2012   Referred patient to the [facility] for diagnostic mammogram and right breast ultrasound. Appointment scheduled for Wednesday, August 04, 2012 at 1510.    Chicken pox    Depression 2004   Diverticulitis    Hx of adenomatous colonic polyps 02/19/2022   Migraines    no longer has them. Was medication related   Rotator cuff (capsule) sprain    right   Seasonal allergies    Shortness of breath dyspnea    occasionally    Social History   Socioeconomic History   Marital status: Single    Spouse name: Not on file   Number of children: 2  Years of education: Not on file   Highest education level: Some college, no degree  Occupational History   Occupation: student bus driver   Occupation: customer service -road side assistance  Tobacco Use   Smoking status: Never   Smokeless tobacco: Never  Vaping Use   Vaping status: Never Used  Substance and Sexual Activity   Alcohol use: Yes    Alcohol/week: 0.0 standard drinks of alcohol    Comment: social   Drug use: No   Sexual  activity: Yes    Partners: Male    Birth control/protection: Surgical    Comment: tubial ligation  Other Topics Concern   Not on file  Social History Narrative   Single.   2 Children, 1 grandson.   Works as a surveyor, mining.   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   Social Drivers of Corporate Investment Banker Strain: High Risk (02/17/2023)   Overall Financial Resource Strain (CARDIA)    Difficulty of Paying Living Expenses: Very hard  Food Insecurity: Food Insecurity Present (02/17/2023)   Hunger Vital Sign    Worried About Running Out of Food in the Last Year: Often true    Ran Out of Food in the Last Year: Often true  Transportation Needs: No Transportation Needs (02/17/2023)   PRAPARE - Administrator, Civil Service (Medical): No    Lack of Transportation (Non-Medical): No  Physical Activity: Unknown (02/17/2023)   Exercise Vital Sign    Days of Exercise per Week: 0 days    Minutes of Exercise per Session: Not on file  Stress: Stress Concern Present (02/17/2023)   [employer] of Occupational Health - Occupational Stress Questionnaire    Feeling of Stress : To some extent  Social Connections: Moderately Isolated (02/17/2023)   Social Connection and Isolation Panel [NHANES]    Frequency of Communication with Friends and Family: Once a week    Frequency of Social Gatherings with Friends and Family: Never    Attends Religious Services: More than 4 times per year    Active Member of [employer] or Organizations: Yes    Attends Engineer, Structural: More than 4 times per year    Marital Status: Divorced  Catering Manager Violence: Not on file    Past Surgical History:  Procedure Laterality Date   APPLICATION OF A-CELL OF CHEST/ABDOMEN Bilateral 12/31/2015   Procedure: APPLICATION OF A-CELL OF BILATERAL AXILLA;  Surgeon: Vicenta Poli, MD;  Location: [facility];  Service: General;  Laterality: Bilateral;   BREAST CYST  ASPIRATION  2014   CERVICAL DISCECTOMY  2012   HYDRADENITIS EXCISION Bilateral 12/31/2015   Procedure: WIDE EXCISION BILATERAL AXILLARY HIDRADENITIS WITH ACELL APPLICATION;  Surgeon: Vicenta Poli, MD;  Location: [facility];  Service: General;  Laterality: Bilateral;   JOINT REPLACEMENT     TOTAL KNEE ARTHROPLASTY Right 06/18/2015   Procedure: TOTAL KNEE ARTHROPLASTY;  Surgeon: Dempsey Sensor, MD;  Location: [facility];  Service: Orthopedics;  Laterality: Right;   TUBAL LIGATION      Family History  Problem Relation Age of Onset   Colon cancer Mother 68   Kidney cancer Mother 52   Diabetes Mother    Hyperlipidemia Mother    Melanoma Mother    Lung cancer Father 61 - 96   Diabetes Sister    Asthma Sister    Colon cancer Brother 75   Colon polyps Brother        >  40 polyps   Colon cancer Brother 48   Colon polyps Brother        8 polyps   Cancer Maternal Uncle    Cancer Paternal Uncle        unknown types   Esophageal cancer Neg Hx    Rectal cancer Neg Hx    Stomach cancer Neg Hx     Allergies  Allergen Reactions   Morphine And Codeine Itching and Nausea And Vomiting   Medroxyprogesterone      Gave her migraine for 3 months   Hydrocodone  Itching    Current Outpatient Medications on File Prior to Visit  Medication Sig Dispense Refill   acetaminophen  (TYLENOL ) 325 MG tablet Take 650 mg by mouth. 6A, 12N, 6P,     DULoxetine  (CYMBALTA ) 60 MG capsule TAKE 1 CAPSULE BY MOUTH ONCE DAILY FOR DEPRESSION AND JOINT PAIN 90 capsule 0   No current facility-administered medications on file prior to visit.    BP 108/70   Pulse 82   Temp (!) 97.2 F (36.2 C) (Temporal)   Ht 5' 7 (1.702 m)   Wt 291 lb (132 kg)   SpO2 99%   BMI 45.58 kg/m  Objective:   Physical Exam HENT:     Right Ear: Tympanic membrane and ear canal normal.     Left Ear: Tympanic membrane and ear canal normal.  Eyes:     Pupils: Pupils are equal, round, and reactive to light.   Cardiovascular:     Rate and Rhythm: Normal rate and regular rhythm.  Pulmonary:     Effort: Pulmonary effort is normal.     Breath sounds: Normal breath sounds.  Abdominal:     General: Bowel sounds are normal.     Palpations: Abdomen is soft.     Tenderness: There is no abdominal tenderness.  Musculoskeletal:     Cervical back: Neck supple. Tenderness present. Pain with movement present. Decreased range of motion.     Lumbar back: Decreased range of motion.       Back:     Right knee: Decreased range of motion.     Left knee: Decreased range of motion.     Comments: Limited ability to ambulate with cane, unable to stand up right. Slowly ambulates.   Decrease in ROM with lumbar extension and flexion.   Decrease in ROM with flexion, extension, lateral rotation of cervical spine.   Marked decrease ROM to bilateral knees with flexion and extension.   Skin:    General: Skin is warm and dry.  Neurological:     Mental Status: She is alert and oriented to person, place, and time.     Cranial Nerves: No cranial nerve deficit.     Deep Tendon Reflexes:     Reflex Scores:      Patellar reflexes are 2+ on the right side and 2+ on the left side. Psychiatric:        Mood and Affect: Mood normal.           Assessment & Plan:  Encounter for annual general medical examination with abnormal findings in adult Assessment & Plan: Declines all vaccines. Pap smear due, completed today Mammogram due, orders placed. Colonoscopy UTD, due 2027  Discussed the importance of a healthy diet and regular exercise in order for weight loss, and to reduce the risk of further co-morbidity.  Exam stable. Labs pending.  Follow up in 1 year for repeat physical.    Screening mammogram for breast cancer -  3D Screening Mammogram, Left and Right; Future  Screening for cervical cancer -     Cytology - PAP  Frequent headaches Assessment & Plan: Improving.  Increase amitriptyline  to 25 mg  at bedtime. She will update.  Orders: -     Amitriptyline  HCl; Take 1 tablet (25 mg total) by mouth at bedtime. For migraine prevention and joint pain  Dispense: 90 tablet; Refill: 3  Moderate episode of recurrent major depressive disorder [facility]) Assessment & Plan: Improving.  Continue Cymbalta  60 mg daily.   Prediabetes Assessment & Plan: Repeat A1c pending.  Orders: -     Hemoglobin A1c -     Lipid panel -     Comprehensive metabolic panel -     CBC  Migraine without aura and without status migrainosus, not intractable Assessment & Plan: Improved, not at goal.  Increase amitriptyline  to 25 mg at bedtime. Continue sumatriptan  50 mg at bedtime.  She will update.  Orders: -     Amitriptyline  HCl; Take 1 tablet (25 mg total) by mouth at bedtime. For migraine prevention and joint pain  Dispense: 90 tablet; Refill: 3 -     SUMAtriptan  Succinate; Take 1 tablet by mouth at migraine onset. May repeat in 2 hours if headache persists or recurs. Do not exceed 2 tablets in 24 hours.  Dispense: 10 tablet; Refill: 0  Bilateral chronic knee pain Assessment & Plan: Deteriorated with marked decrease in mobility.  X-rays of the bilateral knees ordered and pending. Referral placed to orthopedics.  Handicap placard renewed.  Orders: -     Ambulatory referral to Orthopedic Surgery -     DG Knee 1-2 Views Left -     DG Knee 1-2 Views Right  Chronic neck pain Assessment & Plan: With bilateral upper extremity radiculopathy.  X-rays of the cervical spine ordered and pending. Referral placed orthopedics.  Orders: -     Ambulatory referral to Orthopedic Surgery -     DG Cervical Spine 2 or 3 views  Chronic bilateral low back pain with bilateral sciatica Assessment & Plan: X-rays of the lumbar spine ordered and pending.  Referral placed to orthopedics.  Handicap placard renewed.  Orders: -     Ambulatory referral to Orthopedic Surgery -     DG Lumbar Spine 2-3  Views  Encounter for immunization -     Varicella-zoster vaccine IM  Chronic joint pain Assessment & Plan: Negative for autoimmune arthritis and gout.  Continue Cymbalta  60 mg daily. Increase amitriptyline  to 25 mg at bedtime.         Ryley Bachtel K Yehudit Fulginiti, NP

## 2023-02-17 NOTE — Assessment & Plan Note (Signed)
Improved, not at goal.  Increase amitriptyline to 25 mg at bedtime. Continue sumatriptan 50 mg at bedtime.  She will update.

## 2023-02-19 LAB — CYTOLOGY - PAP
Adequacy: ABSENT
Comment: NEGATIVE
Diagnosis: NEGATIVE
High risk HPV: NEGATIVE

## 2023-03-01 DIAGNOSIS — G8929 Other chronic pain: Secondary | ICD-10-CM

## 2023-03-05 ENCOUNTER — Encounter: Payer: Self-pay | Admitting: Physical Medicine and Rehabilitation

## 2023-03-05 ENCOUNTER — Ambulatory Visit (INDEPENDENT_AMBULATORY_CARE_PROVIDER_SITE_OTHER): Payer: 59 | Admitting: Physical Medicine and Rehabilitation

## 2023-03-05 VITALS — BP 132/87 | HR 78

## 2023-03-05 DIAGNOSIS — M5412 Radiculopathy, cervical region: Secondary | ICD-10-CM | POA: Diagnosis not present

## 2023-03-05 DIAGNOSIS — M7918 Myalgia, other site: Secondary | ICD-10-CM | POA: Diagnosis not present

## 2023-03-05 DIAGNOSIS — M545 Low back pain, unspecified: Secondary | ICD-10-CM | POA: Diagnosis not present

## 2023-03-05 DIAGNOSIS — M961 Postlaminectomy syndrome, not elsewhere classified: Secondary | ICD-10-CM | POA: Diagnosis not present

## 2023-03-05 DIAGNOSIS — R269 Unspecified abnormalities of gait and mobility: Secondary | ICD-10-CM

## 2023-03-05 DIAGNOSIS — G8929 Other chronic pain: Secondary | ICD-10-CM

## 2023-03-05 NOTE — Progress Notes (Signed)
Krystal Mann - 53 y.o. female MRN [id number]  Date of birth: 1970-09-19  Office Visit Note: Visit Date: 03/05/2023 PCP: Doreene Nest, NP Referred by: Doreene Nest, NP  Subjective: Chief Complaint  Patient presents with   Lower Back - Pain   HPI: Krystal Mann is a 53 y.o. female who comes in today per the request of Vernona Rieger, NP for multiple issues. Chronic, worsening and severe left sided lower back pain. Intermittent numbness and tingling to left leg and chronic bilateral neck pain radiating down both arms. Pain ongoing for several years, worsens with standing and activity, prolonged sitting also causes severe pain. Laying down seems to alleviate her pain. She describes her pain as sore, aching and sharp, currently rates as 8 out of 10. Some relief of pain with home exercise regimen, rest and use of medications. No history of formal physical therapy. Recent lumbar radiographs show extensive lower lumbar spine facet degenerative changes. No prior MRI imaging of lumbar spine. No history of lumbar surgery/injections.   Chronic, worsening and severe bilateral neck pain radiating to shoulders and down both arms. Pain ongoing for several years, worsens with activity and movement. She does have chronic migraine headaches, feels these headaches are connect to her neck pain. She describes pain as sore and aching, currently rates as 6 out of 10. Some relief of pain with home exercise regimen, rest and use of medications. No history of formal physical therapy for neck pain. She underwent C4-C5 and C5-C6 ACDF with Dr. Marikay Alar in 2012, she reports good relief of pain for several years post surgery. Severe pain started back in 2020.   Patient denies focal weakness. She is currently using walker to assist with ambulation. Patients course is complicated by morbid obesity, chronic joint pain, chronic headaches and depression. She does take Cymbalta daily. 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     Review of Systems  Musculoskeletal:  Positive for back pain, joint pain, myalgias and neck pain.  Neurological:  Positive for tingling and headaches. Negative for focal weakness and weakness.  Psychiatric/Behavioral:  Positive for depression.   All other systems reviewed and are negative.  Otherwise per HPI.  Assessment & Plan: Visit Diagnoses:    ICD-10-CM   1. Chronic left-sided low back pain without sciatica  M54.50    G89.29     2. Myofascial pain syndrome  M79.18     3. Radiculopathy, cervical region  M54.12     4. Post laminectomy syndrome  M96.1     5. Gait abnormality  R26.9        Plan: Findings:  1. Chronic, worsening and severe left sided lower back pain, intermittent paresthesias down left leg. Patient continues to have severe pain despite good conservative therapies such as home exercise regimen, rest and use of medications. Patients clinical presentation and exam are consistent with facet mediated pain. She does have pain with lumbar extension. There is prominent facet arthropathy of lower lumbar spine on recent radiographs. I also feel there is a myofascial component contributing to her pain. There is significant myofascial tenderness noted upon palpation of left lumbar paraspinal region, left lateral hip and left buttock region today. We discussed treatment plan in detail. Given the chronicity of her symptoms and continued severe pain I placed order for lumbar MRI imaging. Depending on results of MRI imaging we discussed possibility of performing lumbar facet joint injections. Should her pain present as more radicular in  nature would consider performing lumbar epidural steroid injection. She has no focal weakness on exam today, good strength noted to bilateral lower extremities. No red flag symptoms noted upon exam today.   2. Chronic, worsening and severe bilateral neck pain radiating to shoulders and down both arms. Intermittent  paresthesias to bilateral upper extremities.  Patient continues to have severe pain despite good conservative therapy such as home exercise regimen, rest and use of medications.  Patient's clinical presentation and exam are consistent with cervical radiculopathy.  Also feel there is a myofascial component contributing to her symptoms as she does have severe myofascial tenderness to bilateral levator scapula and trapezius regions.  She does have a history of prior C4-C5 and C5-C6 ACDF in 2012.  We discussed treatment plan in detail today.  Next step is to place order for new cervical MRI imaging.  Depending on results of MRI imaging would consider having her follow-up with Dr. Yetta Barre for re-evaluation, would also consider cervical injections if appropriate. I do think she would benefit from short course of formal physical therapy with a focus on manual treatments and dry needling, we will hold on PT for now. No red flag symptoms noted upon exam of cervical spine today.     Meds & Orders: No orders of the defined types were placed in this encounter.  No orders of the defined types were placed in this encounter.   Follow-up: Return for Lumbar and Cervical MRI review.   Procedures: No procedures performed      Clinical History: No specialty comments available.   She reports that she has never smoked. She has never used smokeless tobacco.  Recent Labs    02/17/23 0923  HGBA1C 6.4    Objective:  VS:  HT:    WT:   BMI:     BP:132/87  HR:78bpm  TEMP: ( )  RESP:  Physical Exam Vitals and nursing note reviewed.  HENT:     Head: Normocephalic and atraumatic.     Right Ear: External ear normal.     Left Ear: External ear normal.     Nose: Nose normal.     Mouth/Throat:     Mouth: Mucous membranes are moist.  Eyes:     Extraocular Movements: Extraocular movements intact.  Cardiovascular:     Rate and Rhythm: Normal rate.     Pulses: Normal pulses.  Pulmonary:     Effort: Pulmonary  effort is normal.  Abdominal:     General: Abdomen is flat. There is no distension.  Musculoskeletal:        General: Tenderness present.     Cervical back: Tenderness present.     Comments: Patient is slow to rise from seated position to standing. Pain noted with facet loading and lumbar extension. 5/5 strength noted with bilateral hip flexion, knee flexion/extension, ankle dorsiflexion/plantarflexion and EHL. No clonus noted bilaterally. No pain upon palpation of greater trochanters. No pain with internal/external rotation of bilateral hips. Sensation intact bilaterally. Myofascial tenderness noted to left lumbar paraspinal region. Negative slump test bilaterally. Ambulates with cane, antalgic gait noted.  No discomfort noted with flexion, extension and side-to-side rotation. Patient has good strength in the upper extremities including 5 out of 5 strength in wrist extension, long finger flexion and APB. Shoulder range of motion is full bilaterally without any sign of impingement. There is no atrophy of the hands intrinsically. Sensation intact bilaterally. Myofascial tenderness noted to bilateral levator scapulae and trapezius regions. Negative Hoffman's sign. Negative Spurling's  sign.       Skin:    General: Skin is warm and dry.     Capillary Refill: Capillary refill takes less than 2 seconds.  Neurological:     Mental Status: She is alert and oriented to person, place, and time.     Gait: Gait abnormal.  Psychiatric:        Mood and Affect: Mood normal.        Behavior: Behavior normal.     Ortho Exam  Imaging: No results found.  Past Medical/Family/Surgical/Social History: Medications & Allergies reviewed per EMR, new medications updated. Patient Active Problem List   Diagnosis Date Noted   Chronic neck pain 02/17/2023   Chronic back pain 02/17/2023   Family history of colonic polyps 04/15/2022   Hx of adenomatous colonic polyps 02/19/2022   Migraines 02/14/2022   Chronic  joint pain 12/19/2021   MDD (major depressive disorder) 12/19/2021   Prediabetes 09/06/2020   Frequent headaches 09/06/2020   Uterus, adenomyosis 08/19/2018   Abnormal uterine bleeding (AUB) 08/19/2018   Iron deficiency anemia secondary to blood loss (chronic) 08/05/2018   Primary osteoarthritis of right knee 06/15/2015   Encounter for annual general medical examination with abnormal findings in adult 01/01/2015   Family history of familial polyposis - attenuated - brother 08/31/2014   Family history of colon cancer 08/30/2014   Bilateral chronic knee pain 06/13/2014   Obesity (BMI 35.0-39.9 without comorbidity) 06/13/2014   Eczema 06/13/2014   Bilateral lower extremity edema 10/10/2011   Past Medical History:  Diagnosis Date   Anemia    Arthritis    knee   Axillary abscess 06/13/2014   Back pain    Breast lump on right side at 9 o'clock position 07/27/2012   Referred patient to the [facility] for diagnostic mammogram and right breast ultrasound. Appointment scheduled for Wednesday, August 04, 2012 at 1510.    Chicken pox    Depression 2004   Diverticulitis    Hx of adenomatous colonic polyps 02/19/2022   Migraines    no longer has them. Was medication related   Rotator cuff (capsule) sprain    right   Seasonal allergies    Shortness of breath dyspnea    occasionally   Family History  Problem Relation Age of Onset   Colon cancer Mother 37   Kidney cancer Mother 77   Diabetes Mother    Hyperlipidemia Mother    Melanoma Mother    Lung cancer Father 79 - 36   Diabetes Sister    Asthma Sister    Colon cancer Brother 68   Colon polyps Brother        >40 polyps   Colon cancer Brother 72   Colon polyps Brother        8 polyps   Cancer Maternal Uncle    Cancer Paternal Uncle        unknown types   Esophageal cancer Neg Hx    Rectal cancer Neg Hx    Stomach cancer Neg Hx    Past Surgical History:  Procedure Laterality Date   APPLICATION OF A-CELL OF  CHEST/ABDOMEN Bilateral 12/31/2015   Procedure: APPLICATION OF A-CELL OF BILATERAL AXILLA;  Surgeon: Abigail Miyamoto, MD;  Location: [facility];  Service: General;  Laterality: Bilateral;   BREAST CYST ASPIRATION  2014   CERVICAL DISCECTOMY  2012   HYDRADENITIS EXCISION Bilateral 12/31/2015   Procedure: WIDE EXCISION BILATERAL AXILLARY HIDRADENITIS WITH ACELL APPLICATION;  Surgeon: Abigail Miyamoto,  MD;  Location: [facility];  Service: General;  Laterality: Bilateral;   JOINT REPLACEMENT     TOTAL KNEE ARTHROPLASTY Right 06/18/2015   Procedure: TOTAL KNEE ARTHROPLASTY;  Surgeon: Gean Birchwood, MD;  Location: [facility];  Service: Orthopedics;  Laterality: Right;   TUBAL LIGATION     Social History   Occupational History   Occupation: Chartered loss adjuster   Occupation: customer service -road side assistance  Tobacco Use   Smoking status: Never   Smokeless tobacco: Never  Vaping Use   Vaping status: Never Used  Substance and Sexual Activity   Alcohol use: Yes    Alcohol/week: 0.0 standard drinks of alcohol    Comment: social   Drug use: No   Sexual activity: Yes    Partners: Male    Birth control/protection: Surgical    Comment: tubial ligation

## 2023-03-05 NOTE — Progress Notes (Signed)
 Pain score =8

## 2023-03-09 MED ORDER — MELOXICAM 15 MG PO TABS: 15.0000 mg | ORAL_TABLET | Freq: Every day | ORAL | 0 refills | Status: DC | PRN

## 2023-03-11 ENCOUNTER — Other Ambulatory Visit: Payer: Self-pay | Admitting: Primary Care

## 2023-03-11 DIAGNOSIS — R519 Headache, unspecified: Secondary | ICD-10-CM

## 2023-03-11 DIAGNOSIS — F331 Major depressive disorder, recurrent, moderate: Secondary | ICD-10-CM

## 2023-03-11 DIAGNOSIS — G8929 Other chronic pain: Secondary | ICD-10-CM

## 2023-03-20 ENCOUNTER — Ambulatory Visit
Admission: RE | Admit: 2023-03-20 | Discharge: 2023-03-20 | Disposition: A | Discharge status: Home / Self Care | Payer: 59 | Source: Ambulatory Visit | Attending: Primary Care | Admitting: Primary Care

## 2023-03-20 DIAGNOSIS — Z1231 Encounter for screening mammogram for malignant neoplasm of breast: Secondary | ICD-10-CM

## 2023-03-22 ENCOUNTER — Ambulatory Visit
Admission: RE | Admit: 2023-03-22 | Discharge: 2023-03-22 | Disposition: A | Discharge status: Home / Self Care | Payer: 59 | Source: Ambulatory Visit | Attending: Physical Medicine and Rehabilitation | Admitting: Physical Medicine and Rehabilitation

## 2023-03-22 DIAGNOSIS — G8929 Other chronic pain: Secondary | ICD-10-CM

## 2023-03-22 DIAGNOSIS — R269 Unspecified abnormalities of gait and mobility: Secondary | ICD-10-CM

## 2023-03-22 DIAGNOSIS — M7918 Myalgia, other site: Secondary | ICD-10-CM

## 2023-04-05 ENCOUNTER — Other Ambulatory Visit: Payer: Self-pay | Admitting: Primary Care

## 2023-04-05 DIAGNOSIS — G8929 Other chronic pain: Secondary | ICD-10-CM

## 2023-04-15 ENCOUNTER — Encounter: Payer: Self-pay | Admitting: Emergency Medicine

## 2023-04-15 ENCOUNTER — Ambulatory Visit
Admission: EM | Admit: 2023-04-15 | Discharge: 2023-04-15 | Disposition: A | Discharge status: Home / Self Care | Attending: Internal Medicine | Admitting: Internal Medicine

## 2023-04-15 DIAGNOSIS — J301 Allergic rhinitis due to pollen: Secondary | ICD-10-CM | POA: Diagnosis not present

## 2023-04-15 DIAGNOSIS — H8112 Benign paroxysmal vertigo, left ear: Secondary | ICD-10-CM

## 2023-04-15 DIAGNOSIS — H6992 Unspecified Eustachian tube disorder, left ear: Secondary | ICD-10-CM

## 2023-04-15 MED ORDER — FLUTICASONE PROPIONATE 50 MCG/ACT NA SUSP: 1.0000 | Freq: Every day | NASAL | 0 refills | Status: AC

## 2023-04-15 MED ORDER — PREDNISONE 20 MG PO TABS: 40.0000 mg | ORAL_TABLET | Freq: Every day | ORAL | 0 refills | Status: AC

## 2023-04-15 MED ORDER — MECLIZINE HCL 12.5 MG PO TABS: 12.5000 mg | ORAL_TABLET | Freq: Three times a day (TID) | ORAL | 0 refills | Status: DC | PRN

## 2023-04-15 MED ORDER — CETIRIZINE HCL 10 MG PO TABS: 10.0000 mg | ORAL_TABLET | Freq: Every day | ORAL | 2 refills | Status: AC

## 2023-04-15 MED ORDER — ONDANSETRON 4 MG PO TBDP: 4.0000 mg | ORAL_TABLET | Freq: Three times a day (TID) | ORAL | 0 refills | Status: DC | PRN

## 2023-04-15 NOTE — ED Provider Notes (Signed)
 [facility]    CSN: [id number] Arrival date & time: 04/15/23  1214      History   Chief Complaint Chief Complaint  Patient presents with   Hearing Problem    HPI Krystal Mann is a 53 y.o. female.   Krystal Mann is a 53 y.o. female presenting for chief complaint of tinnitus/muffled hearing to the left ear that started 2 days ago. No recent trauma/injuries to the ear/eardrum or recent swimming. She hears a "swooshing and sometimes high pitched ringing sound" to the left ear. Describes sensation of feeling like she's under water. She washed out the ear with peroxide at home due to suspicion for ear wax impaction. She was unable to remove any wax from the ear and has not seen any purulent/yellow drainage from the ear. She reports associated dizziness with head movement and attributes this to vertigo at baseline. States dizziness has worsened since ear began to feel "weird".  Reports intermittent nausea without vomiting as well. Dizziness is triggered by head movement and feels like a room spinning sensation. No recent/history of head trauma or injury. Denies recent falls, urinary symptoms, fevers/chills, cough/congestion, and headache. Eating does not change dizziness, she reports adequate water intake. No paresthesias, extremity weakness, or vision changes. She has a history of seasonal allergies and states she has not started taking allergy meds yet.      Past Medical History:  Diagnosis Date   Anemia    Arthritis    knee   Axillary abscess 06/13/2014   Back pain    Breast lump on right side at 9 o'clock position 07/27/2012   Referred patient to the [facility] for diagnostic mammogram and right breast ultrasound. Appointment scheduled for Wednesday, August 04, 2012 at 1510.    Chicken pox    Depression 2004   Diverticulitis    Hx of adenomatous colonic polyps 02/19/2022   Migraines    no longer has them. Was medication related   Rotator  cuff (capsule) sprain    right   Seasonal allergies    Shortness of breath dyspnea    occasionally    Patient Active Problem List   Diagnosis Date Noted   Chronic neck pain 02/17/2023   Chronic back pain 02/17/2023   Family history of colonic polyps 04/15/2022   Hx of adenomatous colonic polyps 02/19/2022   Migraines 02/14/2022   Chronic joint pain 12/19/2021   MDD (major depressive disorder) 12/19/2021   Prediabetes 09/06/2020   Frequent headaches 09/06/2020   Uterus, adenomyosis 08/19/2018   Abnormal uterine bleeding (AUB) 08/19/2018   Iron deficiency anemia secondary to blood loss (chronic) 08/05/2018   Primary osteoarthritis of right knee 06/15/2015   Encounter for annual general medical examination with abnormal findings in adult 01/01/2015   Family history of familial polyposis - attenuated - brother 08/31/2014   Family history of colon cancer 08/30/2014   Bilateral chronic knee pain 06/13/2014   Obesity (BMI 35.0-39.9 without comorbidity) 06/13/2014   Eczema 06/13/2014   Bilateral lower extremity edema 10/10/2011    Past Surgical History:  Procedure Laterality Date   APPLICATION OF A-CELL OF CHEST/ABDOMEN Bilateral 12/31/2015   Procedure: APPLICATION OF A-CELL OF BILATERAL AXILLA;  Surgeon: Abigail Miyamoto, MD;  Location: [facility];  Service: General;  Laterality: Bilateral;   BREAST CYST ASPIRATION  2014   CERVICAL DISCECTOMY  2012   HYDRADENITIS EXCISION Bilateral 12/31/2015   Procedure: WIDE EXCISION BILATERAL AXILLARY HIDRADENITIS WITH ACELL APPLICATION;  Surgeon:  Abigail Miyamoto, MD;  Location: [facility];  Service: General;  Laterality: Bilateral;   JOINT REPLACEMENT     TOTAL KNEE ARTHROPLASTY Right 06/18/2015   Procedure: TOTAL KNEE ARTHROPLASTY;  Surgeon: Gean Birchwood, MD;  Location: [facility];  Service: Orthopedics;  Laterality: Right;   TUBAL LIGATION      OB History     Gravida  3   Para  2   Term  2   Preterm  0   AB   1   Living  2      SAB  1   IAB      Ectopic      Multiple      Live Births               Home Medications    Prior to Admission medications   Medication Sig Start Date End Date Taking? Authorizing Provider  cetirizine (ZYRTEC ALLERGY) 10 MG tablet Take 1 tablet (10 mg total) by mouth daily. 04/15/23  Yes Carlisle Beers, FNP  fluticasone (FLONASE) 50 MCG/ACT nasal spray Place 1 spray into both nostrils daily. 04/15/23  Yes Carlisle Beers, FNP  meclizine (ANTIVERT) 12.5 MG tablet Take 1 tablet (12.5 mg total) by mouth 3 (three) times daily as needed for dizziness. 04/15/23  Yes Carlisle Beers, FNP  ondansetron (ZOFRAN-ODT) 4 MG disintegrating tablet Take 1 tablet (4 mg total) by mouth every 8 (eight) hours as needed for nausea or vomiting. 04/15/23  Yes Carlisle Beers, FNP  predniSONE (DELTASONE) 20 MG tablet Take 2 tablets (40 mg total) by mouth daily with breakfast for 5 days. 04/15/23 04/20/23 Yes StanhopeDonavan Burnet, FNP  acetaminophen (TYLENOL) 325 MG tablet Take 650 mg by mouth. 6A, 12N, 6P,    [provider]  amitriptyline (ELAVIL) 25 MG tablet Take 1 tablet (25 mg total) by mouth at bedtime. For migraine prevention and joint pain 02/17/23   Doreene Nest, NP  DULoxetine (CYMBALTA) 60 MG capsule TAKE 1 CAPSULE BY MOUTH ONCE DAILY FOR  DEPRESSION  AND  JOINT  PAIN 03/12/23   Doreene Nest, NP  meloxicam (MOBIC) 15 MG tablet TAKE 1 TABLET BY MOUTH ONCE DAILY AS NEEDED FOR PAIN 04/05/23   Doreene Nest, NP  SUMAtriptan (IMITREX) 50 MG tablet Take 1 tablet by mouth at migraine onset. May repeat in 2 hours if headache persists or recurs. Do not exceed 2 tablets in 24 hours. 02/17/23   Doreene Nest, NP    Family History Family History  Problem Relation Age of Onset   Colon cancer Mother 63   Kidney cancer Mother 35   Diabetes Mother    Hyperlipidemia Mother    Melanoma Mother    Lung cancer Father 81 - 26   Diabetes  Sister    Asthma Sister    Colon cancer Brother 27   Colon polyps Brother        >40 polyps   Colon cancer Brother 19   Colon polyps Brother        8 polyps   Cancer Maternal Uncle    Cancer Paternal Uncle        unknown types   Esophageal cancer Neg Hx    Rectal cancer Neg Hx    Stomach cancer Neg Hx     Social History Social History   Tobacco Use   Smoking status: Never   Smokeless tobacco: Never  Vaping Use   Vaping status: Never  Used  Substance Use Topics   Alcohol use: Yes    Alcohol/week: 0.0 standard drinks of alcohol    Comment: social   Drug use: No     Allergies   Morphine and codeine, Medroxyprogesterone, and Hydrocodone   Review of Systems Review of Systems Per HPI  Physical Exam Triage Vital Signs ED Triage Vitals  Encounter Vitals Group     BP 04/15/23 1233 127/86     Systolic BP Percentile --      Diastolic BP Percentile --      Pulse Rate 04/15/23 1233 96     Resp 04/15/23 1233 17     Temp 04/15/23 1233 98.2 F (36.8 C)     Temp Source 04/15/23 1233 Oral     SpO2 04/15/23 1233 95 %     Weight --      Height --      Head Circumference --      Peak Flow --      Pain Score 04/15/23 1238 0     Pain Loc --      Pain Education --      Exclude from Growth Chart --    No data found.  Updated Vital Signs BP 127/86 (BP Location: Right Arm)   Pulse 96   Temp 98.2 F (36.8 C) (Oral)   Resp 17   SpO2 95%   Visual Acuity Right Eye Distance:   Left Eye Distance:   Bilateral Distance:    Right Eye Near:   Left Eye Near:    Bilateral Near:     Physical Exam Vitals and nursing note reviewed.  Constitutional:      Appearance: She is not ill-appearing or toxic-appearing.  HENT:     Head: Normocephalic and atraumatic.     Right Ear: Hearing, ear canal and external ear normal. No decreased hearing noted. A middle ear effusion is present. There is no impacted cerumen. Tympanic membrane is not perforated, erythematous or bulging.      Left Ear: Ear canal and external ear normal. Decreased hearing noted. A middle ear effusion is present. There is no impacted cerumen. Tympanic membrane is not perforated, erythematous or bulging.     Nose: Nose normal.     Mouth/Throat:     Lips: Pink.     Mouth: Mucous membranes are moist. No injury or oral lesions.     Dentition: Normal dentition.     Tongue: No lesions.     Pharynx: Oropharynx is clear. Uvula midline. No pharyngeal swelling, oropharyngeal exudate, posterior oropharyngeal erythema, uvula swelling or postnasal drip.     Tonsils: No tonsillar exudate.  Eyes:     General: Lids are normal. Vision grossly intact. Gaze aligned appropriately.     Extraocular Movements: Extraocular movements intact.     Conjunctiva/sclera: Conjunctivae normal.  Neck:     Trachea: Trachea and phonation normal.  Cardiovascular:     Rate and Rhythm: Normal rate and regular rhythm.     Heart sounds: Normal heart sounds, S1 normal and S2 normal.  Pulmonary:     Effort: Pulmonary effort is normal. No respiratory distress.     Breath sounds: Normal breath sounds and air entry.  Musculoskeletal:     Cervical back: Neck supple.  Lymphadenopathy:     Cervical: No cervical adenopathy.  Skin:    General: Skin is warm and dry.     Capillary Refill: Capillary refill takes less than 2 seconds.     Findings: No rash.  Neurological:     General: No focal deficit present.     Mental Status: She is alert and oriented to person, place, and time. Mental status is at baseline.     GCS: GCS eye subscore is 4. GCS verbal subscore is 5. GCS motor subscore is 6.     Cranial Nerves: Cranial nerves 2-12 are intact. No dysarthria or facial asymmetry.     Sensory: Sensation is intact.     Motor: Motor function is intact. No weakness, tremor, abnormal muscle tone or pronator drift.     Coordination: Coordination is intact. Romberg sign negative. Coordination normal. Finger-Nose-Finger Test normal.     Gait: Gait is  intact.     Comments: Strength and sensation intact to bilateral upper and lower extremities (5/5). Moves all 4 extremities with normal coordination voluntarily. Non-focal neuro exam.   Psychiatric:        Mood and Affect: Mood normal.        Speech: Speech normal.        Behavior: Behavior normal.        Thought Content: Thought content normal.        Judgment: Judgment normal.      UC Treatments / Results  Labs (all labs ordered are listed, but only abnormal results are displayed) Labs Reviewed - No data to display  EKG   Radiology No results found.  Procedures Procedures (including critical care time)  Medications Ordered in UC Medications - No data to display  Initial Impression / Assessment and Plan / UC Course  I have reviewed the triage vital signs and the nursing notes.  Pertinent labs & imaging results that were available during my care of the patient were reviewed by me and considered in my medical decision making (see chart for details).   1. Acute dysfunction of left eustachian tube, seasonal allergic rhinitis due to pollen, benign paroxysmal vertigo of left ear Presentation consistent with vertigo that is likely worsened by left eustachian tube dysfunction and ear effusion.  Neuro exam normal, non-focal, low suspicion for acute intracranial abnormality causing dizziness/hearing loss.  Meclizine ordered for vertigo, drowsiness precautions discussed.  Prednisone burst ordered to reduce inflammation to the bilateral ears contributing to dizziness/eustachian tube dysfunction. Flonase and zyrtec ordered to treat allergies.  Zofran as needed for nausea.   Recommend follow-up with PCP in 5-7 days, sooner if symptoms fail to improve in the next 48-72 hours.   Counseled patient on potential for adverse effects with medications prescribed/recommended today, strict ER and return-to-clinic precautions discussed, patient verbalized understanding.    Final Clinical  Impressions(s) / UC Diagnoses   Final diagnoses:  Acute dysfunction of left eustachian tube  Benign paroxysmal positional vertigo of left ear  Seasonal allergic rhinitis due to pollen     Discharge Instructions      Your eustachian tubes are inflamed causing ear fullness and vertigo symptoms.  Take meclizine every 8 hours as needed for dizziness, this will make you drowsy so only take at bedtime/when you plan to stay home and not drive.  Flonase 2 puffs into each nostril daily.  Take oral antihistamine daily (choose one of the following: Claritin, allegra, zyrtec).   Take prednisone once daily for the next 5 days with breakfast.  Tylenol as needed for pain.  Zofran as needed for nausea/vomiting.   If you develop any new or worsening symptoms or if your symptoms do not start to improve, please return here or follow-up with your primary care provider.  If your symptoms are severe, please go to the emergency room.     ED Prescriptions     Medication Sig Dispense Auth. Provider   ondansetron (ZOFRAN-ODT) 4 MG disintegrating tablet Take 1 tablet (4 mg total) by mouth every 8 (eight) hours as needed for nausea or vomiting. 20 tablet Reita May M, FNP   meclizine (ANTIVERT) 12.5 MG tablet Take 1 tablet (12.5 mg total) by mouth 3 (three) times daily as needed for dizziness. 30 tablet Reita May M, FNP   predniSONE (DELTASONE) 20 MG tablet Take 2 tablets (40 mg total) by mouth daily with breakfast for 5 days. 10 tablet Reita May M, FNP   fluticasone [facility]) 50 MCG/ACT nasal spray Place 1 spray into both nostrils daily. 16 g Reita May M, FNP   cetirizine (ZYRTEC ALLERGY) 10 MG tablet Take 1 tablet (10 mg total) by mouth daily. 30 tablet Carlisle Beers, FNP      PDMP not reviewed this encounter.   Carlisle Beers, [place] 04/16/23 [phone]

## 2023-04-15 NOTE — Discharge Instructions (Signed)
 Your eustachian tubes are inflamed causing ear fullness and vertigo symptoms.  Take meclizine every 8 hours as needed for dizziness, this will make you drowsy so only take at bedtime/when you plan to stay home and not drive.  Flonase 2 puffs into each nostril daily.  Take oral antihistamine daily (choose one of the following: Claritin, allegra, zyrtec).   Take prednisone once daily for the next 5 days with breakfast.  Tylenol as needed for pain.  Zofran as needed for nausea/vomiting.   If you develop any new or worsening symptoms or if your symptoms do not start to improve, please return here or follow-up with your primary care provider. If your symptoms are severe, please go to the emergency room.

## 2023-04-15 NOTE — ED Triage Notes (Addendum)
 Pt c/o left ear hearing loss for 3 days. She tried peroxide at home.  She has had some vertigo

## 2023-04-23 ENCOUNTER — Encounter: Payer: Self-pay | Admitting: Primary Care

## 2023-04-23 ENCOUNTER — Ambulatory Visit: Admitting: Primary Care

## 2023-04-23 VITALS — BP 110/74 | HR 97 | Temp 97.3°F | Ht 67.0 in | Wt 307.0 lb

## 2023-04-23 DIAGNOSIS — R42 Dizziness and giddiness: Secondary | ICD-10-CM

## 2023-04-23 DIAGNOSIS — R519 Headache, unspecified: Secondary | ICD-10-CM | POA: Diagnosis not present

## 2023-04-23 DIAGNOSIS — H9192 Unspecified hearing loss, left ear: Secondary | ICD-10-CM | POA: Insufficient documentation

## 2023-04-23 NOTE — Assessment & Plan Note (Signed)
 Given changes in headaches, will proceed with MRI brain.  Stat orders for MRI brain ordered and pending. Continue amitriptyline 25 mg at bedtime for headache prevention, continue sumatriptan 50 mg as needed.  We discussed to try a sumatriptan tablet at home to see if this helps.

## 2023-04-23 NOTE — Assessment & Plan Note (Signed)
 Symptoms representative of tinnitus. Urgent care notes reviewed.  Given hearing loss, coupled with vertigo and headaches, need to rule out other causes.  Stat MRI brain ordered and pending. ENT referral placed.

## 2023-04-23 NOTE — Assessment & Plan Note (Addendum)
 Symptoms mostly representative of vertigo, however will rule out other causes.  MRI brain ordered and pending.  Agreed to provide FMLA for patient given her job duties for her occupation. FMLA start date of 04/13/2023 with tentative return to work date of 05/14/2023.  Referral placed to ENT.

## 2023-04-23 NOTE — Progress Notes (Signed)
 Subjective:    Patient ID: Krystal Mann, female    DOB: 1970/02/22, 53 y.o.   MRN: [id number]  HPI  Krystal Mann is a very pleasant 53 y.o. female with a history of migraines, abnormal uterine bleeding, lower extremity edema, obesity, prediabetes, frequent headaches, chronic joint pain, MDD, chronic back pain, chronic neck pain who presents today for urgent care follow-up.  She presented to [facility] on 04/15/2023 for symptoms of tinnitus/muffled hearing to the left ear that started 2 days prior.  She describes her symptoms as a "whooshing and sometimes high-pitched ringing sound".  Also felt like she was underwater.  Also with intermittent dizziness with head movement, nauseated without vomiting.   She was diagnosed with acute dysfunction of left eustachian tube, vertigo.  She was treated with meclizine, prednisone, Zofran, Flonase, and Zyrtec.   Today she discusses that she cannot hear well out of her left ear. She mostly hears a constant high pitched ringing sound to her left year. She's been taking prednisone, Zyrtec, Flonase without improvement. She continues to experience intermittent vertigo symptoms that occur when turning her head. Also with intermittent headache behind her left eye. These headaches are different from her typical migraines.   She has missed work. She needs FMLA for her symptoms of vertigo/dizziness, left sided headaches. 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 She would like a start date of 04/13/23. She would like to try to return on 05/14/23.   She denies unilateral weakness, paresthesias, changes in speech.  She is not managed on aspirin. She's been taking Meloxicam 60 mg (four 15 mg tablets at a time) on occasion for chronic back pain. Has an appointment with orthopedics tomorrow to discuss MRI results.   Review of Systems  HENT:  Positive for hearing loss and tinnitus.   Eyes:  Negative  for visual disturbance.  Neurological:  Positive for dizziness and headaches. Negative for speech difficulty and numbness.         Past Medical History:  Diagnosis Date   Anemia    Arthritis    knee   Axillary abscess 06/13/2014   Back pain    Breast lump on right side at 9 o'clock position 07/27/2012   Referred patient to the [facility] for diagnostic mammogram and right breast ultrasound. Appointment scheduled for Wednesday, August 04, 2012 at 1510.    Chicken pox    Depression 2004   Diverticulitis    Hx of adenomatous colonic polyps 02/19/2022   Migraines    no longer has them. Was medication related   Rotator cuff (capsule) sprain    right   Seasonal allergies    Shortness of breath dyspnea    occasionally    Social History   Socioeconomic History   Marital status: Single    Spouse name: Not on file   Number of children: 2   Years of education: Not on file   Highest education level: Some college, no degree  Occupational History   Occupation: student bus driver   Occupation: customer service -road side assistance  Tobacco Use   Smoking status: Never   Smokeless tobacco: Never  Vaping Use   Vaping status: Never Used  Substance and Sexual Activity   Alcohol use: Yes    Alcohol/week: 0.0 standard drinks of alcohol    Comment: social   Drug use: No   Sexual activity: Yes    Partners: Male  Birth control/protection: Surgical    Comment: tubial ligation  Other Topics Concern   Not on file  Social History Narrative   Single.   2 Children, 1 grandson.   Works as a Surveyor, mining.   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   Social Drivers of Corporate investment banker Strain: High Risk (02/17/2023)   Overall Financial Resource Strain (CARDIA)    Difficulty of Paying Living Expenses: Very hard  Food Insecurity: Food Insecurity Present (02/17/2023)   Hunger Vital Sign    Worried About Running Out of Food in the  Last Year: Often true    Ran Out of Food in the Last Year: Often true  Transportation Needs: No Transportation Needs (02/17/2023)   PRAPARE - Administrator, Civil Service (Medical): No    Lack of Transportation (Non-Medical): No  Physical Activity: Unknown (02/17/2023)   Exercise Vital Sign    Days of Exercise per Week: 0 days    Minutes of Exercise per Session: Not on file  Stress: Stress Concern Present (02/17/2023)   [employer] of Occupational Health - Occupational Stress Questionnaire    Feeling of Stress : To some extent  Social Connections: Moderately Isolated (02/17/2023)   Social Connection and Isolation Panel [NHANES]    Frequency of Communication with Friends and Family: Once a week    Frequency of Social Gatherings with Friends and Family: Never    Attends Religious Services: More than 4 times per year    Active Member of [employer] or Organizations: Yes    Attends Engineer, structural: More than 4 times per year    Marital Status: Divorced  Catering manager Violence: Not on file    Past Surgical History:  Procedure Laterality Date   APPLICATION OF A-CELL OF CHEST/ABDOMEN Bilateral 12/31/2015   Procedure: APPLICATION OF A-CELL OF BILATERAL AXILLA;  Surgeon: Abigail Miyamoto, MD;  Location: [facility];  Service: General;  Laterality: Bilateral;   BREAST CYST ASPIRATION  2014   CERVICAL DISCECTOMY  2012   HYDRADENITIS EXCISION Bilateral 12/31/2015   Procedure: WIDE EXCISION BILATERAL AXILLARY HIDRADENITIS WITH ACELL APPLICATION;  Surgeon: Abigail Miyamoto, MD;  Location: [facility];  Service: General;  Laterality: Bilateral;   JOINT REPLACEMENT     TOTAL KNEE ARTHROPLASTY Right 06/18/2015   Procedure: TOTAL KNEE ARTHROPLASTY;  Surgeon: Gean Birchwood, MD;  Location: [facility];  Service: Orthopedics;  Laterality: Right;   TUBAL LIGATION      Family History  Problem Relation Age of Onset   Colon cancer Mother 39   Kidney cancer  Mother 14   Diabetes Mother    Hyperlipidemia Mother    Melanoma Mother    Lung cancer Father 42 - 77   Diabetes Sister    Asthma Sister    Colon cancer Brother 29   Colon polyps Brother        >40 polyps   Colon cancer Brother 43   Colon polyps Brother        8 polyps   Cancer Maternal Uncle    Cancer Paternal Uncle        unknown types   Esophageal cancer Neg Hx    Rectal cancer Neg Hx    Stomach cancer Neg Hx     Allergies  Allergen Reactions   Morphine And Codeine Itching and Nausea And Vomiting   Medroxyprogesterone     Gave her migraine for 3 months   Hydrocodone  Itching    Current Outpatient Medications on File Prior to Visit  Medication Sig Dispense Refill   acetaminophen (TYLENOL) 325 MG tablet Take 650 mg by mouth. 6A, 12N, 6P,     amitriptyline (ELAVIL) 25 MG tablet Take 1 tablet (25 mg total) by mouth at bedtime. For migraine prevention and joint pain 90 tablet 3   cetirizine (ZYRTEC ALLERGY) 10 MG tablet Take 1 tablet (10 mg total) by mouth daily. 30 tablet 2   DULoxetine (CYMBALTA) 60 MG capsule TAKE 1 CAPSULE BY MOUTH ONCE DAILY FOR  DEPRESSION  AND  JOINT  PAIN 90 capsule 2   fluticasone (FLONASE) 50 MCG/ACT nasal spray Place 1 spray into both nostrils daily. 16 g 0   meclizine (ANTIVERT) 12.5 MG tablet Take 1 tablet (12.5 mg total) by mouth 3 (three) times daily as needed for dizziness. 30 tablet 0   meloxicam (MOBIC) 15 MG tablet TAKE 1 TABLET BY MOUTH ONCE DAILY AS NEEDED FOR PAIN 30 tablet 0   SUMAtriptan (IMITREX) 50 MG tablet Take 1 tablet by mouth at migraine onset. May repeat in 2 hours if headache persists or recurs. Do not exceed 2 tablets in 24 hours. 10 tablet 0   ondansetron (ZOFRAN-ODT) 4 MG disintegrating tablet Take 1 tablet (4 mg total) by mouth every 8 (eight) hours as needed for nausea or vomiting. (Patient not taking: Reported on 04/23/2023) 20 tablet 0   No current facility-administered medications on file prior to visit.    BP  110/74   Pulse 97   Temp (!) 97.3 F (36.3 C) (Temporal)   Ht 5\' 7"  (1.702 m)   Wt (!) 307 lb (139.3 kg)   SpO2 98%   BMI 48.08 kg/m  Objective:   Physical Exam HENT:     Right Ear: Tympanic membrane and ear canal normal. There is no impacted cerumen.     Left Ear: Tympanic membrane and ear canal normal. There is no impacted cerumen.  Eyes:     Extraocular Movements: Extraocular movements intact.  Cardiovascular:     Rate and Rhythm: Normal rate and regular rhythm.  Pulmonary:     Effort: Pulmonary effort is normal.     Breath sounds: Normal breath sounds.  Musculoskeletal:     Cervical back: Neck supple.  Skin:    General: Skin is warm and dry.  Neurological:     Mental Status: She is alert and oriented to person, place, and time.     Cranial Nerves: No cranial nerve deficit.  Psychiatric:        Mood and Affect: Mood normal.           Assessment & Plan:  Hearing loss of left ear, unspecified hearing loss type Assessment & Plan: Symptoms representative of tinnitus. Urgent care notes reviewed.  Given hearing loss, coupled with vertigo and headaches, need to rule out other causes.  Stat MRI brain ordered and pending. ENT referral placed.  Orders: -     Ambulatory referral to ENT -     MR BRAIN W WO CONTRAST; Future  Frequent headaches Assessment & Plan: Given changes in headaches, will proceed with MRI brain.  Stat orders for MRI brain ordered and pending. Continue amitriptyline 25 mg at bedtime for headache prevention, continue sumatriptan 50 mg as needed.  We discussed to try a sumatriptan tablet at home to see if this helps.  Orders: -     Ambulatory referral to ENT -     MR BRAIN W  WO CONTRAST; Future  Vertigo Assessment & Plan: Symptoms mostly representative of vertigo, however will rule out other causes.  MRI brain ordered and pending.  Agreed to provide FMLA for patient given her job duties for her occupation. FMLA start date of 04/13/2023  with tentative return to work date of 05/14/2023.  Referral placed to ENT.  Orders: -     Ambulatory referral to ENT -     MR BRAIN W WO CONTRAST; Future        Doreene Nest, NP

## 2023-04-23 NOTE — Patient Instructions (Addendum)
 You will either be contacted via phone regarding your referral to ENT, or you may receive a letter on your MyChart portal from our referral team with instructions for scheduling an appointment. Please let us know if you have not been contacted by anyone within two weeks.  You will receive a phone call regarding the MRI brain.  Try taking sumatriptan (Imitrex) tablet when you get home.  Send me the [facility] paperwork.  Schedule a follow-up visit before May 14, 2023.  It was a pleasure to see you today!

## 2023-04-24 ENCOUNTER — Ambulatory Visit: Admitting: Physical Medicine and Rehabilitation

## 2023-04-24 ENCOUNTER — Encounter: Payer: Self-pay | Admitting: Physical Medicine and Rehabilitation

## 2023-04-24 DIAGNOSIS — M5441 Lumbago with sciatica, right side: Secondary | ICD-10-CM

## 2023-04-24 DIAGNOSIS — M5442 Lumbago with sciatica, left side: Secondary | ICD-10-CM | POA: Diagnosis not present

## 2023-04-24 DIAGNOSIS — M5412 Radiculopathy, cervical region: Secondary | ICD-10-CM | POA: Diagnosis not present

## 2023-04-24 DIAGNOSIS — M5416 Radiculopathy, lumbar region: Secondary | ICD-10-CM | POA: Diagnosis not present

## 2023-04-24 DIAGNOSIS — R269 Unspecified abnormalities of gait and mobility: Secondary | ICD-10-CM

## 2023-04-24 DIAGNOSIS — M7918 Myalgia, other site: Secondary | ICD-10-CM

## 2023-04-24 DIAGNOSIS — G8929 Other chronic pain: Secondary | ICD-10-CM

## 2023-04-24 NOTE — Progress Notes (Signed)
 Pain Scale   Average Pain 10 Patient advising her pain is constant and walking make it worse, she also advises that at times she can lean forward and the pressure in her back releases. No numbness or tingling. However, patient does advise that pain radiates to he right leg, not sure if it is coming from her back or her chronic knee problems.        +Driver, -BT, -Dye Allergies.

## 2023-04-24 NOTE — Progress Notes (Signed)
 Lesslie Bonita Drouillard - 53 y.o. female MRN [id number]  Date of birth: [birth year]  Office Visit Note: Visit Date: 04/24/2023 PCP: Gabriel John, NP Referred by: Gabriel John, NP  Subjective: Chief Complaint  Patient presents with   Lower Back - Pain   HPI: Mylan Bonita Schorr is a 53 y.o. female who comes in today for evaluation of multiple chronic issues.  Last seen in the office in February of this year.  Chronic, worsening and severe bilateral lower back pain radiating down posterior thighs to knees and bilateral neck pain radiating down both arms. Her lower back seems to be biggest pain generator at this time, her pain has changed some since our last office visit in February. Her pain worsens with prolonged sitting and walking. She describes her pain as sore and burning sensation, currently rates as 8 out of 10. Some relief of pain with home exercise regimen, rest and use of medications. States she has been taking Meloxicam daily, (four 15 mg tablets at one time). Recent lumbar MRI imaging exhibits multi level moderate and severe facet arthropathy, moderate biforaminal stenosis at the level of L5-S1. No high grade spinal canal stenosis noted. Patient currently using cane to assist with ambulation. No recent trauma or falls.   Also reports chronic, worsening and severe bilateral neck pain radiating to shoulders and down both arms. Her pain worsens with driving. She describes pain as sore and tingling sensation, currently rates as 5 out of 10. Recent cervical MRI imaging shows operative changes of anterior cervical fusion from C4 through C7. There is also severe foraminal stenosis bilaterally at C3-C4 and C6-C7 secondary to uncovertebral joint disease and facet arthropathy. History of C4-C5 and C5-C6 ACDF with Dr. Waymond Hailey in 2012, she reports good relief of pain for several years post surgery. Severe pain started back in 2020.   Unfortunately, she is dealing with left ear hearing  loss. She has upcoming appointment with ENT.      Review of Systems  Musculoskeletal:  Positive for back pain, myalgias and neck pain.  Neurological:  Positive for tingling. Negative for focal weakness and weakness.  All other systems reviewed and are negative.  Otherwise per HPI.  Assessment & Plan: Visit Diagnoses:    ICD-10-CM   1. Chronic bilateral low back pain with bilateral sciatica  M54.42 Ambulatory referral to Physical Therapy   M54.41    G89.29     2. Lumbar radiculopathy  M54.16 Ambulatory referral to Physical Therapy    3. Radiculopathy, cervical region  M54.12 Ambulatory referral to Physical Therapy    4. Myofascial pain syndrome  M79.18 Ambulatory referral to Physical Therapy    5. Morbid (severe) obesity due to excess calories (HCC)  E66.01 Ambulatory referral to Physical Therapy    6. Gait abnormality  R26.9        Plan: Findings:  1. Chronic, worsening and severe bilateral lower back pain radiating down posterior thighs to knees. Her lower back and legs seems to be biggest pain generator for her at this time. Her pain continues despite good conservative therapies such as home exercise regimen, rest and use of medications. Patients clinical presentation and exam do seem most consistent with lumbar radiculopathy, more of S1 nerve pattern. I also feel there is a facet mediated aspect contributing to her lower back discomfort. There is multi level moderate and severe facet arthropathy noted on recent lumbar MRI imaging. We discussed treatment plan in detail today. I would recommend diagnostic and  hopefully therapeutic L5-S1 interlaminar epidural steroid injection. If her pain seems more lower back could try facet joint injections. She voiced severe anxiety related to injection procedures, I would be happy to provide her with oral pre-procedure sedation to take prior to injection. She would like some time to think about options and discuss plan with her family. I did go  ahead and place order for short course of formal physical therapy to address both lower back and neck issues. I instructed her to take Meloxicam as prescribed, 1 tablet once a day. She will let us  know how she would like to proceed. She has no questions at this time.   2. Chronic, worsening and severe bilateral neck pain radiating to shoulders and down arms. 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 She is currently out of work on [employer] due to other medical issues. We could potentially look at cervical epidural steroid injection for her neck. I would like her to complete short course of formal physical therapy first as she does have quite a bit of myofascial tenderness to neck and surrounding musculature. Would also consider referral back to Dr. Waymond Hailey with [facility] to discuss treatment options. Patient has no questions at this time. No red flag symptoms noted upon exam today.     Meds & Orders: No orders of the defined types were placed in this encounter.   Orders Placed This Encounter  Procedures   Ambulatory referral to Physical Therapy    Follow-up: Return if symptoms worsen or fail to improve.   Procedures: No procedures performed      Clinical History: CLINICAL DATA:  Low back pain radiating to legs   EXAM: MRI LUMBAR SPINE WITHOUT CONTRAST   TECHNIQUE: Multiplanar, multisequence MR imaging of the lumbar spine was performed. No intravenous contrast was administered.   COMPARISON:  MRI of the lumbar spine dated 03/04/2018   FINDINGS: Segmentation: Standard.   Alignment:  Physiologic lumbar alignment is maintained.   Vertebrae: Vertebral bodies demonstrate normal signal intensity. No compression fractures.   Conus medullaris and cauda equina: The conus medullaris terminates at the level of L1-L2. The distal spinal cord signal intensity is normal.   Paraspinal and other soft tissues: The visualized abdomen and pelvis show no soft tissue  abnormality. The visualized aorta is normal.   Disc levels:   L1-L2: Disc is normal in configuration. Severe bilateral facet arthropathy. No neuroforaminal stenosis. No spinal canal stenosis.   L2-L3: Mild disc bulge. Severe bilateral facet arthropathy. No neuroforaminal stenosis. No spinal canal stenosis.   L3-L4: Mild disc bulge. Moderate bilateral facet arthropathy. No neuroforaminal stenosis. No spinal canal stenosis.   L4-L5: Disc bulge. Severe bilateral facet arthropathy. Mild bilateral neuroforaminal stenosis. Mild spinal canal stenosis.   L5-S1: Disc bulge. Severe bilateral facet arthropathy. Moderate bilateral neuroforaminal stenosis. Mild spinal canal stenosis.   IMPRESSION: 1. Mild canal stenosis at L4-L5 and L5-S1 secondary to disc bulging facet arthropathy. 2. Moderate foraminal stenoses bilaterally at L5-S1. 3. Moderate and severe facet arthropathy throughout the lumbar spine.     Electronically Signed   By: Johnanna Mylar M.D.   On: 04/10/2023 14:24   She reports that she has never smoked. She has never used smokeless tobacco.  Recent Labs    02/17/23 0923  HGBA1C 6.4    Objective:  VS:  HT:    WT:   BMI:     BP:   HR: bpm  TEMP: ( )  RESP:  Physical Exam Vitals and nursing note reviewed.  HENT:     Head: Normocephalic and atraumatic.     Right Ear: External ear normal.     Left Ear: External ear normal.     Nose: Nose normal.     Mouth/Throat:     Mouth: Mucous membranes are moist.  Eyes:     Pupils: Pupils are equal, round, and reactive to light.  Cardiovascular:     Rate and Rhythm: Normal rate and regular rhythm.     Pulses: Normal pulses.  Pulmonary:     Effort: Pulmonary effort is normal.  Abdominal:     General: Abdomen is flat. There is no distension.  Musculoskeletal:        General: Tenderness present.     Cervical back: Tenderness present.     Comments: Patient is slow to rise from seated position to standing. Pain noted with  facet loading and lumbar extension. 5/5 strength noted with bilateral hip flexion, knee flexion/extension, ankle dorsiflexion/plantarflexion and EHL. No clonus noted bilaterally. No pain upon palpation of greater trochanters. No pain with internal/external rotation of bilateral hips. Sensation intact bilaterally. Myofascial tenderness noted to bilateral lumbar paraspinal regions. Negative slump test bilaterally. Ambulates with cane, gait slow and unsteady.   No discomfort noted with flexion, extension and side-to-side rotation. Patient has good strength in the upper extremities including 5 out of 5 strength in wrist extension, long finger flexion and APB. Shoulder range of motion is full bilaterally without any sign of impingement. There is no atrophy of the hands intrinsically. Sensation intact bilaterally. Myofascial tenderness noted to bilateral levator scapulae and trapezius regions. Negative Hoffman's sign. Negative Spurling's sign.     Skin:    General: Skin is warm and dry.     Capillary Refill: Capillary refill takes less than 2 seconds.  Neurological:     Mental Status: She is alert.     Gait: Gait abnormal.  Psychiatric:        Mood and Affect: Mood normal.        Behavior: Behavior normal.     Ortho Exam  Imaging: No results found.  Past Medical/Family/Surgical/Social History: Medications & Allergies reviewed per EMR, new medications updated. Patient Active Problem List   Diagnosis Date Noted   Hearing loss of left ear 04/23/2023   Chronic neck pain 02/17/2023   Chronic back pain 02/17/2023   Family history of colonic polyps 04/15/2022   Hx of adenomatous colonic polyps 02/19/2022   Migraines 02/14/2022   Chronic joint pain 12/19/2021   MDD (major depressive disorder) 12/19/2021   Prediabetes 09/06/2020   Frequent headaches 09/06/2020   Vertigo 08/02/2020   Uterus, adenomyosis 08/19/2018   Abnormal uterine bleeding (AUB) 08/19/2018   Iron deficiency anemia secondary to  blood loss (chronic) 08/05/2018   Primary osteoarthritis of right knee 06/15/2015   Encounter for annual general medical examination with abnormal findings in adult 01/01/2015   Family history of familial polyposis - attenuated - brother 08/31/2014   Family history of colon cancer 08/30/2014   Bilateral chronic knee pain 06/13/2014   Obesity (BMI 35.0-39.9 without comorbidity) 06/13/2014   Eczema 06/13/2014   Bilateral lower extremity edema 10/10/2011   Past Medical History:  Diagnosis Date   Anemia    Arthritis    knee   Axillary abscess 06/13/2014   Back pain    Breast lump on right side at 9 o'clock position 07/27/2012   Referred patient to the [facility] for diagnostic  mammogram and right breast ultrasound. Appointment scheduled for Wednesday, August 04, 2012 at 1510.    Chicken pox    Depression 2004   Diverticulitis    Hx of adenomatous colonic polyps 02/19/2022   Migraines    no longer has them. Was medication related   Rotator cuff (capsule) sprain    right   Seasonal allergies    Shortness of breath dyspnea    occasionally   Family History  Problem Relation Age of Onset   Colon cancer Mother 1   Kidney cancer Mother 29   Diabetes Mother    Hyperlipidemia Mother    Melanoma Mother    Lung cancer Father 43 - 60   Diabetes Sister    Asthma Sister    Colon cancer Brother 45   Colon polyps Brother        >40 polyps   Colon cancer Brother 25   Colon polyps Brother        8 polyps   Cancer Maternal Uncle    Cancer Paternal Uncle        unknown types   Esophageal cancer Neg Hx    Rectal cancer Neg Hx    Stomach cancer Neg Hx    Past Surgical History:  Procedure Laterality Date   APPLICATION OF A-CELL OF CHEST/ABDOMEN Bilateral 12/31/2015   Procedure: APPLICATION OF A-CELL OF BILATERAL AXILLA;  Surgeon: Oza Blumenthal, MD;  Location: [facility];  Service: General;  Laterality: Bilateral;   BREAST CYST ASPIRATION  2014    CERVICAL DISCECTOMY  2012   HYDRADENITIS EXCISION Bilateral 12/31/2015   Procedure: WIDE EXCISION BILATERAL AXILLARY HIDRADENITIS WITH ACELL APPLICATION;  Surgeon: Oza Blumenthal, MD;  Location: [facility];  Service: General;  Laterality: Bilateral;   JOINT REPLACEMENT     TOTAL KNEE ARTHROPLASTY Right 06/18/2015   Procedure: TOTAL KNEE ARTHROPLASTY;  Surgeon: Wendolyn Hamburger, MD;  Location: [facility];  Service: Orthopedics;  Laterality: Right;   TUBAL LIGATION     Social History   Occupational History   Occupation: Chartered loss adjuster   Occupation: customer service -road side assistance  Tobacco Use   Smoking status: Never   Smokeless tobacco: Never  Vaping Use   Vaping status: Never Used  Substance and Sexual Activity   Alcohol use: Yes    Alcohol/week: 0.0 standard drinks of alcohol    Comment: social   Drug use: No   Sexual activity: Yes    Partners: Male    Birth control/protection: Surgical    Comment: tubial ligation

## 2023-04-27 ENCOUNTER — Ambulatory Visit
Admission: RE | Admit: 2023-04-27 | Discharge: 2023-04-27 | Disposition: A | Discharge status: Home / Self Care | Source: Ambulatory Visit | Attending: Primary Care | Admitting: Primary Care

## 2023-04-27 DIAGNOSIS — H9192 Unspecified hearing loss, left ear: Secondary | ICD-10-CM

## 2023-04-27 DIAGNOSIS — R42 Dizziness and giddiness: Secondary | ICD-10-CM

## 2023-04-27 DIAGNOSIS — R519 Headache, unspecified: Secondary | ICD-10-CM

## 2023-04-27 MED ORDER — GADOPICLENOL 0.5 MMOL/ML IV SOLN
10.0000 mL | Freq: Once | INTRAVENOUS | Status: AC | PRN
  Administered 2023-04-27: 10 mL via INTRAVENOUS

## 2023-04-27 NOTE — Telephone Encounter (Signed)
 Filled out FMLA paperwork, left on providers desk for review.

## 2023-04-28 NOTE — Telephone Encounter (Signed)
 Completed and placed on Erin's desk.

## 2023-04-29 NOTE — Telephone Encounter (Signed)
 Reached out to the patient and told her the FMLA paperwork was completed and ready. She said she would be by to pick up the forms in person.  FMLA has been sent to scan into patients chart.

## 2023-04-30 ENCOUNTER — Other Ambulatory Visit: Payer: Self-pay | Admitting: Primary Care

## 2023-04-30 DIAGNOSIS — G8929 Other chronic pain: Secondary | ICD-10-CM

## 2023-05-04 NOTE — Telephone Encounter (Signed)
 Patient's stopped by to pick up [facility] paperwork

## 2023-05-08 ENCOUNTER — Inpatient Hospital Stay: Admitting: Primary Care

## 2023-05-18 ENCOUNTER — Other Ambulatory Visit: Payer: 59

## 2023-06-04 ENCOUNTER — Other Ambulatory Visit: Payer: Self-pay | Admitting: Physical Medicine and Rehabilitation

## 2023-06-04 ENCOUNTER — Telehealth: Payer: Self-pay | Admitting: Physical Medicine and Rehabilitation

## 2023-06-04 DIAGNOSIS — M5416 Radiculopathy, lumbar region: Secondary | ICD-10-CM

## 2023-06-04 MED ORDER — DIAZEPAM 5 MG PO TABS: ORAL_TABLET | ORAL | 0 refills | Status: AC

## 2023-06-04 NOTE — Telephone Encounter (Signed)
 Pt requesting an appt with Elvan Hamel, possibly for a shot.

## 2023-06-15 ENCOUNTER — Ambulatory Visit (INDEPENDENT_AMBULATORY_CARE_PROVIDER_SITE_OTHER): Admitting: Physical Medicine and Rehabilitation

## 2023-06-15 ENCOUNTER — Other Ambulatory Visit: Payer: Self-pay

## 2023-06-15 VITALS — BP 123/87 | HR 111

## 2023-06-15 DIAGNOSIS — M5416 Radiculopathy, lumbar region: Secondary | ICD-10-CM

## 2023-06-15 MED ORDER — METHYLPREDNISOLONE ACETATE 40 MG/ML IJ SUSP
40.0000 mg | Freq: Once | INTRAMUSCULAR | Status: AC
  Administered 2023-06-15: 40 mg

## 2023-06-15 NOTE — Patient Instructions (Signed)

## 2023-06-15 NOTE — Procedures (Signed)
 Lumbar Epidural Steroid Injection - Interlaminar Approach with Fluoroscopic Guidance  [name]      Date of Birth: Mar 18, 1970 MRN: [id number] PCP: Gabriel John, NP      Visit Date: 06/15/2023   Universal Protocol:     Consent Given By: the patient  Position: PRONE  Additional Comments: Vital signs were monitored before and after the procedure. Patient was prepped and draped in the usual sterile fashion. The correct patient, procedure, and site was verified.   Injection Procedure Details:   Procedure diagnoses: Lumbar radiculopathy [M54.16]   Meds Administered:  Meds ordered this encounter  Medications   methylPREDNISolone acetate (DEPO-MEDROL) injection 40 mg     Laterality: Right  Location/Site:  L5-S1  Needle: 6.0 in., 20 ga. Tuohy  Needle Placement: Paramedian epidural  Findings:   -Comments: Excellent flow of contrast into the epidural space.  Procedure Details: Using a paramedian approach from the side mentioned above, the region overlying the inferior lamina was localized under fluoroscopic visualization and the soft tissues overlying this structure were infiltrated with 4 ml. of 1% Lidocaine  without Epinephrine . The Tuohy needle was inserted into the epidural space using a paramedian approach.   The epidural space was localized using loss of resistance along with counter oblique bi-planar fluoroscopic views.  After negative aspirate for air, blood, and CSF, a 2 ml. volume of Isovue-250 was injected into the epidural space and the flow of contrast was observed. Radiographs were obtained for documentation purposes.    The injectate was administered into the level noted above.   Additional Comments:  The patient tolerated the procedure well Dressing: 2 x 2 sterile gauze and Band-Aid    Post-procedure details: Patient was observed during the procedure. Post-procedure instructions were reviewed.  Patient left the clinic in stable  condition.

## 2023-06-15 NOTE — Progress Notes (Signed)
 Krystal Mann - 53 y.o. female MRN [id number]  Date of birth: 20-Apr-1970  Office Visit Note: Visit Date: 06/15/2023 PCP: Gabriel John, NP Referred by: Gabriel John, NP  Subjective: Chief Complaint  Patient presents with   Lower Back - Pain   HPI:  Krystal Mann is a 53 y.o. female who comes in today at the request of Elvan Hamel, FNP for planned Right L5-S1 Lumbar Interlaminar epidural steroid injection with fluoroscopic guidance.  The patient has failed conservative care including home exercise, medications, time and activity modification.  This injection will be diagnostic and hopefully therapeutic.  Please see requesting physician notes for further details and justification.   ROS Otherwise per HPI.  Assessment & Plan: Visit Diagnoses:    ICD-10-CM   1. Lumbar radiculopathy  M54.16 XR C-ARM NO REPORT    Epidural Steroid injection    methylPREDNISolone acetate (DEPO-MEDROL) injection 40 mg      Plan: No additional findings.   Meds & Orders:  Meds ordered this encounter  Medications   methylPREDNISolone acetate (DEPO-MEDROL) injection 40 mg    Orders Placed This Encounter  Procedures   XR C-ARM NO REPORT   Epidural Steroid injection    Follow-up: No follow-ups on file.   Procedures: No procedures performed  Lumbar Epidural Steroid Injection - Interlaminar Approach with Fluoroscopic Guidance  [name]      Date of Birth: 01-Jan-1971 MRN: [id number] PCP: Gabriel John, NP      Visit Date: 06/15/2023   Universal Protocol:     Consent Given By: the patient  Position: PRONE  Additional Comments: Vital signs were monitored before and after the procedure. Patient was prepped and draped in the usual sterile fashion. The correct patient, procedure, and site was verified.   Injection Procedure Details:   Procedure diagnoses: Lumbar radiculopathy [M54.16]   Meds Administered:  Meds ordered this encounter   Medications   methylPREDNISolone acetate (DEPO-MEDROL) injection 40 mg     Laterality: Right  Location/Site:  L5-S1  Needle: 6.0 in., 20 ga. Tuohy  Needle Placement: Paramedian epidural  Findings:   -Comments: Excellent flow of contrast into the epidural space.  Procedure Details: Using a paramedian approach from the side mentioned above, the region overlying the inferior lamina was localized under fluoroscopic visualization and the soft tissues overlying this structure were infiltrated with 4 ml. of 1% Lidocaine  without Epinephrine . The Tuohy needle was inserted into the epidural space using a paramedian approach.   The epidural space was localized using loss of resistance along with counter oblique bi-planar fluoroscopic views.  After negative aspirate for air, blood, and CSF, a 2 ml. volume of Isovue-250 was injected into the epidural space and the flow of contrast was observed. Radiographs were obtained for documentation purposes.    The injectate was administered into the level noted above.   Additional Comments:  The patient tolerated the procedure well Dressing: 2 x 2 sterile gauze and Band-Aid    Post-procedure details: Patient was observed during the procedure. Post-procedure instructions were reviewed.  Patient left the clinic in stable condition.   Clinical History: CLINICAL DATA:  Low back pain radiating to legs   EXAM: MRI LUMBAR SPINE WITHOUT CONTRAST   TECHNIQUE: Multiplanar, multisequence MR imaging of the lumbar spine was performed. No intravenous contrast was administered.   COMPARISON:  MRI of the lumbar spine dated 03/04/2018   FINDINGS: Segmentation: Standard.   Alignment:  Physiologic lumbar alignment is maintained.  Vertebrae: Vertebral bodies demonstrate normal signal intensity. No compression fractures.   Conus medullaris and cauda equina: The conus medullaris terminates at the level of L1-L2. The distal spinal cord signal intensity  is normal.   Paraspinal and other soft tissues: The visualized abdomen and pelvis show no soft tissue abnormality. The visualized aorta is normal.   Disc levels:   L1-L2: Disc is normal in configuration. Severe bilateral facet arthropathy. No neuroforaminal stenosis. No spinal canal stenosis.   L2-L3: Mild disc bulge. Severe bilateral facet arthropathy. No neuroforaminal stenosis. No spinal canal stenosis.   L3-L4: Mild disc bulge. Moderate bilateral facet arthropathy. No neuroforaminal stenosis. No spinal canal stenosis.   L4-L5: Disc bulge. Severe bilateral facet arthropathy. Mild bilateral neuroforaminal stenosis. Mild spinal canal stenosis.   L5-S1: Disc bulge. Severe bilateral facet arthropathy. Moderate bilateral neuroforaminal stenosis. Mild spinal canal stenosis.   IMPRESSION: 1. Mild canal stenosis at L4-L5 and L5-S1 secondary to disc bulging facet arthropathy. 2. Moderate foraminal stenoses bilaterally at L5-S1. 3. Moderate and severe facet arthropathy throughout the lumbar spine.     Electronically Signed   By: Johnanna Mylar M.D.   On: 04/10/2023 14:24     Objective:  VS:  HT:    WT:   BMI:     BP:123/87  HR:(!) 111bpm  TEMP: ( )  RESP:  Physical Exam Vitals and nursing note reviewed.  Constitutional:      General: She is not in acute distress.    Appearance: Normal appearance. She is obese. She is not ill-appearing.  HENT:     Head: Normocephalic and atraumatic.     Right Ear: External ear normal.     Left Ear: External ear normal.  Eyes:     Extraocular Movements: Extraocular movements intact.  Cardiovascular:     Rate and Rhythm: Normal rate.     Pulses: Normal pulses.  Pulmonary:     Effort: Pulmonary effort is normal. No respiratory distress.  Abdominal:     General: There is no distension.     Palpations: Abdomen is soft.  Musculoskeletal:        General: Tenderness present.     Cervical back: Neck supple.     Right lower leg: No  edema.     Left lower leg: No edema.     Comments: Patient has good distal strength with no pain over the greater trochanters.  No clonus or focal weakness.  Skin:    Findings: No erythema, lesion or rash.  Neurological:     General: No focal deficit present.     Mental Status: She is alert and oriented to person, place, and time.     Sensory: No sensory deficit.     Motor: No weakness or abnormal muscle tone.     Coordination: Coordination normal.  Psychiatric:        Mood and Affect: Mood normal.        Behavior: Behavior normal.      Imaging: No results found.

## 2023-06-15 NOTE — Progress Notes (Signed)
 Pain Scale   Average Pain 10 Patient advising she has chronic lower back pain and doesn't get any relief. Patient advising her pain increases when walking or standing.        +Driver, -BT, -Dye Allergies.

## 2023-07-11 ENCOUNTER — Other Ambulatory Visit: Payer: Self-pay | Admitting: Primary Care

## 2023-07-11 DIAGNOSIS — G8929 Other chronic pain: Secondary | ICD-10-CM

## 2023-07-13 ENCOUNTER — Encounter (INDEPENDENT_AMBULATORY_CARE_PROVIDER_SITE_OTHER): Payer: Self-pay | Admitting: Otolaryngology

## 2023-07-13 ENCOUNTER — Ambulatory Visit (INDEPENDENT_AMBULATORY_CARE_PROVIDER_SITE_OTHER): Admitting: Audiology

## 2023-07-13 ENCOUNTER — Ambulatory Visit (INDEPENDENT_AMBULATORY_CARE_PROVIDER_SITE_OTHER): Admitting: Otolaryngology

## 2023-07-13 VITALS — BP 126/86 | HR 102 | Ht 68.0 in | Wt 299.0 lb

## 2023-07-13 DIAGNOSIS — H9312 Tinnitus, left ear: Secondary | ICD-10-CM | POA: Diagnosis not present

## 2023-07-13 DIAGNOSIS — R42 Dizziness and giddiness: Secondary | ICD-10-CM | POA: Diagnosis not present

## 2023-07-13 DIAGNOSIS — H9042 Sensorineural hearing loss, unilateral, left ear, with unrestricted hearing on the contralateral side: Secondary | ICD-10-CM | POA: Diagnosis not present

## 2023-07-13 DIAGNOSIS — H9041 Sensorineural hearing loss, unilateral, right ear, with unrestricted hearing on the contralateral side: Secondary | ICD-10-CM

## 2023-07-13 DIAGNOSIS — H903 Sensorineural hearing loss, bilateral: Secondary | ICD-10-CM

## 2023-07-13 DIAGNOSIS — H9072 Mixed conductive and sensorineural hearing loss, unilateral, left ear, with unrestricted hearing on the contralateral side: Secondary | ICD-10-CM | POA: Diagnosis not present

## 2023-07-13 NOTE — Progress Notes (Signed)
 CC: Left ear hearing loss, left ear tinnitus, recurrent dizziness  HPI:  Krystal Mann is a 53 y.o. female who presents today for evaluation of her left ear hearing loss, left ear tinnitus, and recurrent dizziness. - Presents for evaluation of hearing loss in the left ear, which began on 04/13/2023. The onset was gradual. Reports a loud ringing tinnitus in the left ear despite the hearing loss. Loud noises cause pain in the left ear and can trigger a migraine. The hearing loss and associated symptoms also exacerbate her dizziness. - The dizziness has been present for a little over a year, predating the hearing loss. Episodes of vertigo involve a spinning sensation and last for a few minutes.  The dizziness is often triggered with sudden head movement. - Reports frequent migraines, occurring on a weekly basis. These are sometimes associated with the vertigo, particularly if the triggering noise is loud and prolonged. The migraines can also occur upon waking. The symptoms impact daily activities, such as being unable to attend church services due to loud music. - Associated symptoms include mobility issues and being off-balance, which has led to several falls since a right total knee replacement. Reports pain in the left hip, lower back, and shoulders, which is attributed to compensating for the right knee by putting more weight on the left side. This deconditioning and pain began after the COVID-19 pandemic due to inactivity. - Sudden sensorineural hearing loss, left ear, onset 04/13/2023. Was seen at an urgent care clinic where it was initially thought to be related to allergies or blocked fluid. Was prescribed a course of steroids, which did not improve the hearing. - Chronic migraines, for which they take Amitriptyline . The dose was recently increased from one to two tablets at night by a neurologist, which has resulted in a decreased frequency of headaches, though it causes sleepiness. The  neurologist is also considering another medication for the migraines. - Past surgical history includes a right total knee replacement and an anterior cervical discectomy and fusion. - An MRI of the brain last month was negative for any brain tumor, stroke, or multiple sclerosis. The MRI did show a partial empty sella, which the neurologist noted could be an indication of idiopathic intracranial hypertension. - Reports mobility issues and deconditioning following the knee surgery, exacerbated by inactivity during the COVID-19 pandemic. Was advised to attend physical therapy but has been unable to due to transportation issues. - Denies any history of heart or lung problems. - Denies any prior surgery in the ear, nose, or throat area.  Past Medical History:  Diagnosis Date   Anemia    Arthritis    knee   Axillary abscess 06/13/2014   Back pain    Breast lump on right side at 9 o'clock position 07/27/2012   Referred patient to the [facility] for diagnostic mammogram and right breast ultrasound. Appointment scheduled for Wednesday, August 04, 2012 at 1510.    Chicken pox    Depression 2004   Diverticulitis    Hx of adenomatous colonic polyps 02/19/2022   Migraines    no longer has them. Was medication related   Rotator cuff (capsule) sprain    right   Seasonal allergies    Shortness of breath dyspnea    occasionally    Past Surgical History:  Procedure Laterality Date   APPLICATION OF A-CELL OF CHEST/ABDOMEN Bilateral 12/31/2015   Procedure: APPLICATION OF A-CELL OF BILATERAL AXILLA;  Surgeon: Vicenta Poli, MD;  Location: [facility];  Service: General;  Laterality: Bilateral;   BREAST CYST ASPIRATION  2014   CERVICAL DISCECTOMY  2012   HYDRADENITIS EXCISION Bilateral 12/31/2015   Procedure: WIDE EXCISION BILATERAL AXILLARY HIDRADENITIS WITH ACELL APPLICATION;  Surgeon: Vicenta Poli, MD;  Location: [facility];  Service: General;   Laterality: Bilateral;   JOINT REPLACEMENT     TOTAL KNEE ARTHROPLASTY Right 06/18/2015   Procedure: TOTAL KNEE ARTHROPLASTY;  Surgeon: Dempsey Sensor, MD;  Location: [facility];  Service: Orthopedics;  Laterality: Right;   TUBAL LIGATION      Family History  Problem Relation Age of Onset   Colon cancer Mother 17   Kidney cancer Mother 42   Diabetes Mother    Hyperlipidemia Mother    Melanoma Mother    Lung cancer Father 86 - 74   Diabetes Sister    Asthma Sister    Colon cancer Brother 16   Colon polyps Brother        >40 polyps   Colon cancer Brother 2   Colon polyps Brother        8 polyps   Cancer Maternal Uncle    Cancer Paternal Uncle        unknown types   Esophageal cancer Neg Hx    Rectal cancer Neg Hx    Stomach cancer Neg Hx     Social History:  reports that she has never smoked. She has never used smokeless tobacco. She reports current alcohol use. She reports that she does not use drugs.  Allergies:  Allergies  Allergen Reactions   Morphine And Codeine Itching and Nausea And Vomiting   Medroxyprogesterone      Gave her migraine for 3 months   Hydrocodone  Itching    Prior to Admission medications   Medication Sig Start Date End Date Taking? Authorizing Provider  acetaminophen  (TYLENOL ) 325 MG tablet Take 650 mg by mouth. 6A, 12N, 6P,   Yes [provider]  amitriptyline  (ELAVIL ) 25 MG tablet Take 1 tablet (25 mg total) by mouth at bedtime. For migraine prevention and joint pain 02/17/23  Yes Clark, Katherine K, NP  cetirizine  (ZYRTEC  ALLERGY) 10 MG tablet Take 1 tablet (10 mg total) by mouth daily. 04/15/23  Yes Enedelia Dorna HERO, FNP  diazepam  (VALIUM ) 5 MG tablet Take one tablet by mouth with light food one hour prior to procedure. 06/04/23  Yes Williams, Megan E, NP  DULoxetine  (CYMBALTA ) 60 MG capsule TAKE 1 CAPSULE BY MOUTH ONCE DAILY FOR  DEPRESSION  AND  JOINT  PAIN 03/12/23  Yes Clark, Katherine K, NP  fluticasone  (FLONASE ) 50 MCG/ACT nasal spray  Place 1 spray into both nostrils daily. 04/15/23  Yes Enedelia Dorna HERO, FNP  meclizine  (ANTIVERT ) 12.5 MG tablet Take 1 tablet (12.5 mg total) by mouth 3 (three) times daily as needed for dizziness. 04/15/23  Yes Enedelia Dorna HERO, FNP  meloxicam  (MOBIC ) 15 MG tablet TAKE 1 TABLET BY MOUTH ONCE DAILY AS NEEDED FOR PAIN 07/12/23  Yes Clark, Katherine K, NP  ondansetron  (ZOFRAN -ODT) 4 MG disintegrating tablet Take 1 tablet (4 mg total) by mouth every 8 (eight) hours as needed for nausea or vomiting. 04/15/23  Yes Enedelia Dorna HERO, FNP  SUMAtriptan  (IMITREX ) 50 MG tablet Take 1 tablet by mouth at migraine onset. May repeat in 2 hours if headache persists or recurs. Do not exceed 2 tablets in 24 hours. 02/17/23  Yes Gretta Comer POUR, NP    Blood pressure 126/86, pulse (!) 102, height  5' 8 (1.727 m), weight 299 lb (135.6 kg), SpO2 95%. Exam: General: Communicates without difficulty, well nourished, no acute distress. Head: Normocephalic, no evidence injury, no tenderness, facial buttresses intact without stepoff. Face/sinus: No tenderness to palpation and percussion. Facial movement is normal and symmetric. Eyes: PERRL, EOMI. No scleral icterus, conjunctivae clear. Neuro: CN II exam reveals vision grossly intact.  No nystagmus at any point of gaze. Ears: Auricles well formed without lesions.  Ear canals are intact without mass or lesion.  No erythema or edema is appreciated.  The TMs are intact without fluid. Nose: External evaluation reveals normal support and skin without lesions.  Dorsum is intact.  Anterior rhinoscopy reveals congested mucosa over anterior aspect of inferior turbinates and intact septum.  No purulence noted. Oral:  Oral cavity and oropharynx are intact, symmetric, without erythema or edema.  Mucosa is moist without lesions. Neck: Full range of motion without pain.  There is no significant lymphadenopathy.  No masses palpable.  Thyroid  bed within normal limits to palpation.  Parotid  glands and submandibular glands equal bilaterally without mass.  Trachea is midline. Neuro:  CN 2-12 grossly intact. Vestibular: No nystagmus at any point of gaze. Dix Hallpike negative. Vestibular: There is no nystagmus with pneumatic pressure on either tympanic membrane or Valsalva. The cerebellar examination is unremarkable.   Her hearing test shows asymmetric left ear severe sensorineural hearing loss.  Assessment: - Sudden sensorineural hearing loss, left ear, likely permanent given the duration of over two months and lack of response to steroid treatment. The etiology is likely viral or autoimmune. - Chronic dizziness and balance disturbance, multifactorial, likely related to vestibular migraine, deconditioning, and musculoskeletal issues. - Tinnitus, left ear, likely secondary to her hearing loss. - Her MRI scan showed no retrocochlear lesion.  Plan: - The physical exam findings and the hearing test results are reviewed with the patient. - The pathophysiology of vestibular dysfunction and dizziness are discussed extensively with the patient. The possible differential diagnoses are reviewed. Questions are invited and answered.   - Counseled that the hearing loss in the left ear is permanent as it has persisted for several months. Explained that medical or surgical intervention is not available to restore the hearing. Reassured that the MRI has ruled out serious causes like a brain tumor, stroke, or MS. - Advised to continue with the current migraine management as directed by the neurologist. Emphasized that controlling the migraines is important as it may also help with the dizziness. - A referral will be made for vestibular rehabilitation / physical therapy. Explained that this is a specialized form of therapy focused on balance exercises to help the brain recalibrate and improve the sense of balance.  - Discussed hearing rehabilitation options. A conventional hearing aid is unlikely to be  beneficial for the left ear due to the 0% word discrimination score. A CROS (Contralateral Routing of Signal) hearing aid was recommended as a potential option. This device would pick up sound on the left side and transmit it to the right ear, which would help with sound localization and hearing in noisy environments. - Referred to [facility] to discuss the CROS hearing aid if interested. - Counseled on management of tinnitus. Advised using sound enrichment, such as a fan, radio, or TV, especially at night, to help distract the brain from the ringing.  Pam Vanalstine W Donette Mainwaring 07/13/2023, 9:23 PM

## 2023-07-13 NOTE — Progress Notes (Signed)
  [address] [place], [place] [address] [phone]  Audiological Evaluation    Name: Krystal Mann     DOB:   06-21-1970      MRN:   [id number]                                                                                     Service Date: 07/13/2023     Accompanied by: unaccompanied   Patient comes today after Dr. Karis, ENT sent a referral for a hearing evaluation due to concerns with hearing loss.   Symptoms Yes Details  Hearing loss  [x]  Left ear  - March 31st - thought it was popping  Tinnitus  [x]  Constant high pitched tinnitus   Ear pain/ infections/pressure  [x]  Loud noises hurt, cannot be at church because sound levels hurt her ear   Balance problems  [x]  Reports vertigo that is sometimes noise induced.   Noise exposure history  []    Previous ear surgeries  []    Family history of hearing loss  []    Amplification  []    Other  [x]  Difficulty localizing sounds.Noises trigger migraines and also vertigo.    Otoscopy: Right ear: Clear external ear canal and notable landmarks visualized on the tympanic membrane. Left ear:  Clear external ear canal and notable landmarks visualized on the tympanic membrane.  Tympanometry: Right ear: Type A- Normal external ear canal volume with normal middle ear pressure and tympanic membrane compliance. Left ear: Type A- Normal external ear canal volume with normal middle ear pressure and tympanic membrane compliance.   Pure tone Audiometry: Right ear- Normal hearing from [phone] Hz, then mild presumably sensorineural hearing loss at 8000 Hz. Left ear-  Moderate to profound mixed hearing loss from 125 Hz - 8000 Hz. Of note- patient had  difficulty listening to the tones in the left ear (reached the painful threshold quickly) and also tolerating masking levels. Interpret air-bone gaps with caution.   Speech Audiometry: Right ear- Speech Reception Threshold (SRT) was obtained at 15 dBHL. Left ear-Speech Awareness  Threshold (SAT) was observed at 60 dBHL, with contralateral masking.   Word Recognition Score Tested using NU-6 (recorded) Right ear: 96% was obtained at a presentation level of 50 dBHL with contralateral masking which is deemed as  excellent. Left ear: 0% was obtained at a presentation level of 75 dBHL with contralateral masking which is deemed as  poor.   The hearing test results were completed under headphones and results are deemed to be of good to fair reliability. Test technique:  conventional    Impression: There is a significant difference in pure-tone thresholds between ears.   Recommendations: Follow up with ENT as scheduled for today. Return for a hearing evaluation if concerns with hearing changes arise or per MD recommendation. Recommend [facility] for for tinnitus and sound sensitivity management.  Consider a communication needs assessment after medical clearance for hearing aids is obtained.   Krystal Mann, AUD

## 2023-07-21 ENCOUNTER — Encounter: Payer: Self-pay | Admitting: Audiology

## 2023-07-21 NOTE — Therapy (Signed)
 OUTPATIENT PHYSICAL THERAPY VESTIBULAR EVALUATION     Patient Name: Krystal Mann MRN: [id number] DOB:Jun 23, 1970, 53 y.o., female Today's Date: 07/22/2023  END OF SESSION:  PT End of Session - 07/22/23 0805     Visit Number 1    Number of Visits 5    Date for PT Re-Evaluation 09/20/23    Authorization Type AET[employer]    PT Start Time 0803    PT Stop Time 0844    PT Time Calculation (min) 41 min    Activity Tolerance Patient tolerated treatment well   incr pain ambulating in and out of clinic   Behavior During Therapy [facility] for tasks assessed/performed          Past Medical History:  Diagnosis Date   Anemia    Arthritis    knee   Axillary abscess 06/13/2014   Back pain    Breast lump on right side at 9 o'clock position 07/27/2012   Referred patient to the [facility] for diagnostic mammogram and right breast ultrasound. Appointment scheduled for Wednesday, August 04, 2012 at 1510.    Chicken pox    Depression 2004   Diverticulitis    Hx of adenomatous colonic polyps 02/19/2022   Migraines    no longer has them. Was medication related   Rotator cuff (capsule) sprain    right   Seasonal allergies    Shortness of breath dyspnea    occasionally   Past Surgical History:  Procedure Laterality Date   APPLICATION OF A-CELL OF CHEST/ABDOMEN Bilateral 12/31/2015   Procedure: APPLICATION OF A-CELL OF BILATERAL AXILLA;  Surgeon: Vicenta Poli, MD;  Location: [facility];  Service: General;  Laterality: Bilateral;   BREAST CYST ASPIRATION  2014   CERVICAL DISCECTOMY  2012   HYDRADENITIS EXCISION Bilateral 12/31/2015   Procedure: WIDE EXCISION BILATERAL AXILLARY HIDRADENITIS WITH ACELL APPLICATION;  Surgeon: Vicenta Poli, MD;  Location: [facility];  Service: General;  Laterality: Bilateral;   JOINT REPLACEMENT     TOTAL KNEE ARTHROPLASTY Right 06/18/2015   Procedure: TOTAL KNEE ARTHROPLASTY;  Surgeon: Dempsey Sensor, MD;  Location:  [facility];  Service: Orthopedics;  Laterality: Right;   TUBAL LIGATION     Patient Active Problem List   Diagnosis Date Noted   Left-sided tinnitus 07/13/2023   Hearing loss of left ear 04/23/2023   Chronic neck pain 02/17/2023   Chronic back pain 02/17/2023   Family history of colonic polyps 04/15/2022   Hx of adenomatous colonic polyps 02/19/2022   Migraines 02/14/2022   Chronic joint pain 12/19/2021   MDD (major depressive disorder) 12/19/2021   Prediabetes 09/06/2020   Frequent headaches 09/06/2020   Dizziness 08/02/2020   Uterus, adenomyosis 08/19/2018   Abnormal uterine bleeding (AUB) 08/19/2018   Iron deficiency anemia secondary to blood loss (chronic) 08/05/2018   Primary osteoarthritis of right knee 06/15/2015   Encounter for annual general medical examination with abnormal findings in adult 01/01/2015   Family history of familial polyposis - attenuated - brother 08/31/2014   Family history of colon cancer 08/30/2014   Bilateral chronic knee pain 06/13/2014   Obesity (BMI 35.0-39.9 without comorbidity) 06/13/2014   Eczema 06/13/2014   Bilateral lower extremity edema 10/10/2011    PCP: Gretta Comer POUR, NP  REFERRING PROVIDER: Karis Clunes, MD  REFERRING DIAG: R42 (ICD-10-CM) - Dizziness  THERAPY DIAG:  Dizziness and giddiness  Other abnormalities of gait and mobility  ONSET DATE: 07/13/2023  Rationale for Evaluation and Treatment: Rehabilitation  SUBJECTIVE:   SUBJECTIVE STATEMENT: Reports had a R knee replacement 8 years ago. During COVID when she was unable to move, her R knee has locked up on her and now it is harder to move and get around. Started having migraines and vertigo. In March of 2025, couldn't hear out of her L ear and it is a loud buzzing. Took steroids and allergy treatment and nothing happened. Saw Dr. Karis wo thinks etiology is likely viral or autoimmune. Loud noises makes her ear hurt which triggers her migraine and that makes her vertigo act up.  Will get dizzy and lightheaded. Sometimes when she is changing direction, turning her head, getting up or laying down. Reports it is never the same thing. Reports at Dr. Rojean when he turned her head to the R and laid back, was dizzy. But did not have dizziness in the L sided position. Saw a neurologist recently and wants pt to get a spinal tap for IIH. Feels like everything is happening to her L side. Reports she has severe degenerative arthritis through her spine. Got an epidural shot in her back to help relieve the pain and that did not help. Currently filing for disability. Reports if she stumbles, she is currently unable to catch herself for balance. If she falls, unable to get back up. Had one fall where she slipped in the shower. Did not have the strength to stand back up. Currently has a cane with a seat. Used to have a rollator, but it broke because she would sit on it and wheel around. Has been on FMLA since March and since then have been home in her bed. Holds onto things so that she can make it to the bathroom. For her migraines, from her neurologist was advised to start Topamax. Pt accompanied by: self  PERTINENT HISTORY: PMH: Migraines, hx of right total knee replacement and an anterior cervical discectomy and fusion, obesity, depression  Per Dr. Karis: - Presents for evaluation of hearing loss in the left ear, which began on 04/13/2023. The onset was gradual. Reports a loud ringing tinnitus in the left ear despite the hearing loss. Loud noises cause pain in the left ear and can trigger a migraine. The hearing loss and associated symptoms also exacerbate her dizziness. - The dizziness has been present for a little over a year, predating the hearing loss. Episodes of vertigo involve a spinning sensation and last for a few minutes.  The dizziness is often triggered with sudden head movement. - Reports frequent migraines, occurring on a weekly basis. These are sometimes associated with the  vertigo, particularly if the triggering noise is loud and prolonged. The migraines can also occur upon waking   PAIN:  Are you having pain? Yes: NPRS scale: 10/10 in top shoulder area, lower back is 6/10, but will incr, notes R knee constantly hurts and it is always at a 10 Pain location: top shoulder area, lower back Pain description: achy Aggravating factors: standing/sitting for a long period of time Relieving factors: lying in bed  PRECAUTIONS: Fall   FALLS: Has patient fallen in last 6 months? Yes. Number of falls 1, had to have people help her up since there was nothing for her to pull up on   LIVING ENVIRONMENT: Lives with: lives alone and son and daughter in law and their 6 kids have been spending a lot of time with her  Lives in: House/apartment Stairs: Apartment on ground floor  Has following equipment at home: Single point cane,  Shower bench, and Grab bars  PLOF: Independent with household mobility with device, Needs assistance with homemaking, and Vocation/Vocational requirements: was a driver for [employer] Needs help with cooking and cleaning her home, only does simple meals like cereal and sandwiches due to pain   PATIENT GOALS: I wish I could say I want my life back   OBJECTIVE:  Note: Objective measures were completed at Evaluation unless otherwise noted.  DIAGNOSTIC FINDINGS: 04/27/2023 MR BRAIN W WO CONTRAST IMPRESSION:  1. No cerebellopontine angle or internal auditory canal mass.  2. No specific etiology of hearing loss is identified.  3. Partially empty sella turcica. Narrowed appearance of the distal  transverse dural venous sinuses bilaterally. Although nonspecific,  these findings can be seen in the setting of idiopathic intracranial  hypertension (pseudotumor cerebri).  4. Otherwise unremarkable MRI appearance of the brain   03/22/2023 MR LUMBAR SPINE WO CONTRAST IMPRESSION:  1. Mild canal stenosis at L4-L5 and L5-S1 secondary to disc bulging  facet  arthropathy.  2. Moderate foraminal stenoses bilaterally at L5-S1.  3. Moderate and severe facet arthropathy throughout the lumbar  spine.   COGNITION: Overall cognitive status: Within functional limits for tasks assessed  POSTURE:  rounded shoulders, forward head, posterior pelvic tilt, and flexed trunk   RA  GAIT: Gait pattern: step through pattern, decreased stride length, decreased hip/knee flexion- Right, decreased hip/knee flexion- Left, decreased ankle dorsiflexion- Right, decreased ankle dorsiflexion- Left, knee flexed in stance- Right, knee flexed in stance- Left, antalgic, trunk flexed, poor foot clearance- Right, and poor foot clearance- Left Distance walked: Clinic distances  Assistive device utilized: Single point cane (with 3 points that has a seat attached to it) Level of assistance: SBA Comments: pt fatigues easily walking from lobby to treatment room (approx 60 ft), incr forward flexed posture at trunk due to ambulation     PATIENT SURVEYS:  DHI: THE DIZZINESS HANDICAP INVENTORY (DHI)  P1. Does looking up increase your problem? 0 = No  E2. Because of your problem, do you feel frustrated? 4 = Yes  F3. Because of your problem, do you restrict your travel for business or recreation?  4 = Yes  P4. Does walking down the aisle of a supermarket increase your problems?  4 = Yes  F5. Because of your problem, do you have difficulty getting into or out of bed?  4 = Yes  F6. Does your problem significantly restrict your participation in social activities, such as going out to dinner, going to the movies, dancing, or going to parties? 4 = Yes  F7. Because of your problem, do you have difficulty reading?  2 = Sometimes  P8. Does performing more ambitious activities such as sports, dancing, household chores (sweeping or putting dishes away) increase your problems?  4 = Yes  E9. Because of your problem, are you afraid to leave your home without having without having someone  accompany you?  2 = Sometimes  E10. Because of your problem have you been embarrassed in front of others?  4 = Yes  P11. Do quick movements of your head increase your problem?  4 = Yes  F12. Because of your problem, do you avoid heights?  4 = Yes  P13. Does turning over in bed increase your problem?  4 = Yes  F14. Because of your problem, is it difficult for you to do strenuous homework or yard work? 4 = Yes  E15. Because of your problem, are you afraid people may think you  are intoxicated? 0 = No  F16. Because of your problem, is it difficult for you to go for a walk by yourself?  4 = Yes  P17. Does walking down a sidewalk increase your problem?  2 = Sometimes  E18.Because of your problem, is it difficult for you to concentrate 2 = Sometimes  F19. Because of your problem, is it difficult for you to walk around your house in the dark? 4 = Yes  E20. Because of your problem, are you afraid to stay home alone?  2 = Sometimes  E21. Because of your problem, do you feel handicapped? 4 = Yes  E22. Has the problem placed stress on your relationships with members of your family or friends? 0 = No  E23. Because of your problem, are you depressed?  4 = Yes  F24. Does your problem interfere with your job or household responsibilities?  4 = Yes  P25. Does bending over increase your problem?  4 = Yes  TOTAL 78 (severe handicap in regards to dizziness)    DHI Scoring Instructions  The patient is asked to answer each question as it pertains to dizziness or unsteadiness problems, specifically  considering their condition during the last month. Questions are designed to incorporate functional (F), physical  (P), and emotional (E) impacts on disability.   Scores greater than 10 points should be referred to balance specialists for further evaluation.   16-34 Points (mild handicap)  36-52 Points (moderate handicap)  54+ Points (severe handicap)  Minimally Detectable Change: 17 points ([address] [place],  1990)  Tonsina, G. SHAUNNA. and [name], C. W. (1990). The development of the Dizziness Handicap Inventory. Archives of Otolaryngology - Head and Neck Surgery 116(4): [id number].   VESTIBULAR ASSESSMENT:  GENERAL OBSERVATION: Ambulates in with cane (that has seat that can be pulled out) with incr forward flexed posture and antalgic gait, fatigues easily   SYMPTOM BEHAVIOR:  Subjective history: See above.   Due to time constraints during eval, unable to perform vestibular assessment. Will perform at next treatment session                                                                                                                            TREATMENT DATE: 07/22/23  Self-Care: Discussed what vestibular PT will address in regards to dizziness referral and dizziness from migraines Discussed importance of migraine management with medications (pt recently prescribed new medication from neurologist and will be talking to her PCP about incr dose of other medication as she reports neurologist told her she could incr this dosage)  PATIENT EDUCATION: Education details: POC - pt only able to come every other week  Person educated: Patient Education method: Explanation Education comprehension: verbalized understanding  HOME EXERCISE PROGRAM: Will provide at future session   GOALS: Goals reviewed with patient? Yes  SHORT TERM GOALS: ALL STGS= LTGS  LONG TERM GOALS: Target date: 08/19/2023  Pt will be independent with HEP for vestibular deficits  for dizziness.  Baseline:  Goal status: INITIAL  2.  Pt will decr DHI to 65 or less in order to demo decr dizziness in regards to daily life.  Baseline:  Goal status: INITIAL  3.  MSQ to be assessed with LTG written.  Baseline:  Goal status: INITIAL   ASSESSMENT:  CLINICAL IMPRESSION: Patient is a 53 year old female referred to Neuro OPPT for dizziness. Per Dr. Karis, chronic dizziness and balance likely vestibular to vestibular migraine and  deconditioning.  Pt's PMH is significant for: Migraines, hx of right total knee replacement and an anterior cervical discectomy and fusion, obesity, depression. The following deficits were present during the exam: impaired balance, decr activity tolerance, decr functional strength, dizziness, incr pain, gait abnormalities.  Will perform vestibular assessment at next session - did not have time during eval due to length subjective history. Based on [facility], pt has severe handicap in regards to dizziness. Based on hx of falls, pt is an incr risk for falls. Pt is only able to come to therapy every other week due to transportation/co-pay. Pt would benefit from skilled PT to address these impairments and functional limitations to maximize functional mobility independence and decr dizziness.    OBJECTIVE IMPAIRMENTS: Abnormal gait, decreased activity tolerance, decreased balance, decreased endurance, decreased mobility, difficulty walking, decreased ROM, decreased strength, dizziness, hypomobility, impaired flexibility, postural dysfunction, obesity, and pain.   ACTIVITY LIMITATIONS: carrying, lifting, bending, standing, stairs, and locomotion level  PARTICIPATION LIMITATIONS: meal prep, cleaning, laundry, driving, shopping, community activity, and occupation  PERSONAL FACTORS: Behavior pattern, Past/current experiences, Time since onset of injury/illness/exacerbation, Transportation, and 3+ comorbidities: Migraines, hx of right total knee replacement and an anterior cervical discectomy and fusion, obesity, depression are also affecting patient's functional outcome.   REHAB POTENTIAL: Fair due to finances/transportation  limiting frequency  CLINICAL DECISION MAKING: Evolving/moderate complexity  EVALUATION COMPLEXITY: Moderate   PLAN:  PT FREQUENCY: every other week due to finances/transportation   PT DURATION: 8 weeks  PLANNED INTERVENTIONS: 97164- PT Re-evaluation, 97110-Therapeutic exercises, 97530-  Therapeutic activity, [id number]- Neuromuscular re-education, 97535- Self Care, [id number]- Manual therapy, [id number]- Gait training, [phone]- Canalith repositioning, Patient/Family education, Balance training, Vestibular training, and DME instructions  PLAN FOR NEXT SESSION: try rollator? And get order for one? Perform vestibular assessment and provide HEP as appropriate    Sheffield LOISE Senate, PT, DPT 07/22/2023, 12:07 PM

## 2023-07-22 ENCOUNTER — Ambulatory Visit: Attending: Otolaryngology | Admitting: Physical Therapy

## 2023-07-22 ENCOUNTER — Encounter: Payer: Self-pay | Admitting: Physical Therapy

## 2023-07-22 DIAGNOSIS — R2689 Other abnormalities of gait and mobility: Secondary | ICD-10-CM | POA: Insufficient documentation

## 2023-07-22 DIAGNOSIS — R42 Dizziness and giddiness: Secondary | ICD-10-CM | POA: Insufficient documentation

## 2023-07-24 NOTE — Telephone Encounter (Signed)
 Pt daughter in law came by and picked up forms.

## 2023-07-30 ENCOUNTER — Ambulatory Visit: Admitting: Physical Therapy

## 2023-07-30 ENCOUNTER — Encounter: Payer: Self-pay | Admitting: Physical Therapy

## 2023-07-30 VITALS — BP 106/81 | HR 107

## 2023-07-30 DIAGNOSIS — R42 Dizziness and giddiness: Secondary | ICD-10-CM | POA: Diagnosis not present

## 2023-07-30 DIAGNOSIS — R2689 Other abnormalities of gait and mobility: Secondary | ICD-10-CM

## 2023-07-30 NOTE — Therapy (Signed)
 OUTPATIENT PHYSICAL THERAPY VESTIBULAR TREATMENT   Patient Name: Krystal Mann MRN: [id number] DOB:Nov 02, 1970, 53 y.o., female Today's Date: 07/30/2023  END OF SESSION:  PT End of Session - 07/30/23 1401     Visit Number 2    Number of Visits 5    Date for PT Re-Evaluation 09/20/23    Authorization Type AET[employer]    PT Start Time 1400    PT Stop Time 1442    PT Time Calculation (min) 42 min    Activity Tolerance Patient tolerated treatment well   incr pain ambulating in and out of clinic   Behavior During Therapy [facility] for tasks assessed/performed          Past Medical History:  Diagnosis Date   Anemia    Arthritis    knee   Axillary abscess 06/13/2014   Back pain    Breast lump on right side at 9 o'clock position 07/27/2012   Referred patient to the [facility] for diagnostic mammogram and right breast ultrasound. Appointment scheduled for Wednesday, August 04, 2012 at 1510.    Chicken pox    Depression 2004   Diverticulitis    Hx of adenomatous colonic polyps 02/19/2022   Migraines    no longer has them. Was medication related   Rotator cuff (capsule) sprain    right   Seasonal allergies    Shortness of breath dyspnea    occasionally   Past Surgical History:  Procedure Laterality Date   APPLICATION OF A-CELL OF CHEST/ABDOMEN Bilateral 12/31/2015   Procedure: APPLICATION OF A-CELL OF BILATERAL AXILLA;  Surgeon: Vicenta Poli, MD;  Location: [facility];  Service: General;  Laterality: Bilateral;   BREAST CYST ASPIRATION  2014   CERVICAL DISCECTOMY  2012   HYDRADENITIS EXCISION Bilateral 12/31/2015   Procedure: WIDE EXCISION BILATERAL AXILLARY HIDRADENITIS WITH ACELL APPLICATION;  Surgeon: Vicenta Poli, MD;  Location: [facility];  Service: General;  Laterality: Bilateral;   JOINT REPLACEMENT     TOTAL KNEE ARTHROPLASTY Right 06/18/2015   Procedure: TOTAL KNEE ARTHROPLASTY;  Surgeon: Dempsey Sensor, MD;  Location: [facility];  Service: Orthopedics;  Laterality: Right;   TUBAL LIGATION     Patient Active Problem List   Diagnosis Date Noted   Left-sided tinnitus 07/13/2023   Hearing loss of left ear 04/23/2023   Chronic neck pain 02/17/2023   Chronic back pain 02/17/2023   Family history of colonic polyps 04/15/2022   Hx of adenomatous colonic polyps 02/19/2022   Migraines 02/14/2022   Chronic joint pain 12/19/2021   MDD (major depressive disorder) 12/19/2021   Prediabetes 09/06/2020   Frequent headaches 09/06/2020   Dizziness 08/02/2020   Uterus, adenomyosis 08/19/2018   Abnormal uterine bleeding (AUB) 08/19/2018   Iron deficiency anemia secondary to blood loss (chronic) 08/05/2018   Primary osteoarthritis of right knee 06/15/2015   Encounter for annual general medical examination with abnormal findings in adult 01/01/2015   Family history of familial polyposis - attenuated - brother 08/31/2014   Family history of colon cancer 08/30/2014   Bilateral chronic knee pain 06/13/2014   Obesity (BMI 35.0-39.9 without comorbidity) 06/13/2014   Eczema 06/13/2014   Bilateral lower extremity edema 10/10/2011    PCP: Gretta Comer POUR, NP  REFERRING PROVIDER: Karis Clunes, MD  REFERRING DIAG: R42 (ICD-10-CM) - Dizziness  THERAPY DIAG:  Dizziness and giddiness  Other abnormalities of gait and mobility  ONSET DATE: 07/13/2023  Rationale for Evaluation and Treatment: Rehabilitation  SUBJECTIVE:  SUBJECTIVE STATEMENT: Asking if dizziness can be related to the heat. Feels like she has not drank enough water. Is afraid to take a shower - does have a shower seat (but just a little indentation in the wall). Does not have an actual shower seat. Can sit on it but it is not very big. Has a bar in the shower. Has a mat in the shower. Only gets in the shower when she has goes somewhere. Reports lower back is not hurting yet, but has been laying down all day. Reports with her L hip it is getting harder to walk.  Woke up with a migraine this morning. Is out of one of the medications for rescue migraines. Is unsure if she can get that re-filled as does not have money right now. When sitting down -reporting things started closing in on her and then reported feeling nauseous. Know that she needs to get her eyes checked and that she is scheduled to see one. Was scheduled yesterday to get visual field testing, but didn't get to go due to a lack of transportation. Last time she saw an eye doctor was years ago, got a new appt for next week.  Pt accompanied by: self  PERTINENT HISTORY: PMH: Migraines, hx of right total knee replacement and an anterior cervical discectomy and fusion, obesity, depression  Per Dr. Karis: - Presents for evaluation of hearing loss in the left ear, which began on 04/13/2023. The onset was gradual. Reports a loud ringing tinnitus in the left ear despite the hearing loss. Loud noises cause pain in the left ear and can trigger a migraine. The hearing loss and associated symptoms also exacerbate her dizziness. - The dizziness has been present for a little over a year, predating the hearing loss. Episodes of vertigo involve a spinning sensation and last for a few minutes.  The dizziness is often triggered with sudden head movement. - Reports frequent migraines, occurring on a weekly basis. These are sometimes associated with the vertigo, particularly if the triggering noise is loud and prolonged. The migraines can also occur upon waking   PAIN:  Are you having pain? Yes: NPRS scale: 10/10 in top shoulder area, lower back is 5/10 (but pain will go up), but will incr, notes R knee constantly hurts and it is always at a 10 Pain location: top shoulder area, lower back Pain description: achy Aggravating factors: standing/sitting for a long period of time Relieving factors: lying in bed  PRECAUTIONS: Fall   FALLS: Has patient fallen in last 6 months? Yes. Number of falls 1, had to have people  help her up since there was nothing for her to pull up on   LIVING ENVIRONMENT: Lives with: lives alone and son and daughter in law and their 6 kids have been spending a lot of time with her  Lives in: House/apartment Stairs: Apartment on ground floor  Has following equipment at home: Single point cane, Tour manager, and Grab bars  PLOF: Independent with household mobility with device, Needs assistance with homemaking, and Vocation/Vocational requirements: was a driver for [employer] Needs help with cooking and cleaning her home, only does simple meals like cereal and sandwiches due to pain   PATIENT GOALS: I wish I could say I want my life back   OBJECTIVE:  Note: Objective measures were completed at Evaluation unless otherwise noted.  DIAGNOSTIC FINDINGS: 04/27/2023 MR BRAIN W WO CONTRAST IMPRESSION:  1. No cerebellopontine angle or internal auditory canal mass.  2.  No specific etiology of hearing loss is identified.  3. Partially empty sella turcica. Narrowed appearance of the distal  transverse dural venous sinuses bilaterally. Although nonspecific,  these findings can be seen in the setting of idiopathic intracranial  hypertension (pseudotumor cerebri).  4. Otherwise unremarkable MRI appearance of the brain   03/22/2023 MR LUMBAR SPINE WO CONTRAST IMPRESSION:  1. Mild canal stenosis at L4-L5 and L5-S1 secondary to disc bulging  facet arthropathy.  2. Moderate foraminal stenoses bilaterally at L5-S1.  3. Moderate and severe facet arthropathy throughout the lumbar  spine.   COGNITION: Overall cognitive status: Within functional limits for tasks assessed  POSTURE:  rounded shoulders, forward head, posterior pelvic tilt, and flexed trunk    GAIT: Gait pattern: step through pattern, decreased stride length, decreased hip/knee flexion- Right, decreased hip/knee flexion- Left, decreased ankle dorsiflexion- Right, decreased ankle dorsiflexion- Left, knee flexed in stance- Right,  knee flexed in stance- Left, antalgic, trunk flexed, poor foot clearance- Right, and poor foot clearance- Left Distance walked: Clinic distances  Assistive device utilized: Single point cane (with 3 points that has a seat attached to it) Level of assistance: SBA Comments: pt fatigues easily walking from lobby to treatment room (approx 60 ft), incr forward flexed posture at trunk due to ambulation    VESTIBULAR ASSESSMENT:   TREATMENT DATE: 07/30/23  Therapeutic Activity: Vitals:   07/30/23 1414  BP: 106/81  Pulse: (!) 107   Discussed putting in an order to PCP for a shower chair and a rollator, pt in agreement with this. Pt does not currently have a sturdy/adjustable shower chair - has a ledge that is coming out of the wall. Showed pt what a shower chair would look like. Pt also currently using a SPC that can pop out into a seat and it is not sturdy enough for pt and pt with incr forward flexed posture during gait. Discussed rollator referral with pt in agreement with this  Had discussion about importance of seeing the eye doctor as pt mainly having some visual deficits and has not seen an eye doctor in a few years (pt has appt next week)  Discussed having migraine symptoms managed with migraine medications in order for vestibular PT to be effective (pt recently ran out of her rescue medications for migraines and has been unable to pick up a new prescription due to finances)  VESTIBULAR ASSESSMENT   GENERAL OBSERVATION: Ambulates in with cane (that has seat that can be pulled out) with incr forward flexed posture and antalgic gait, fatigues easily    SYMPTOM BEHAVIOR:   Subjective history: See eval on 07/22/23 for further details    Non-Vestibular symptoms: changes in hearing, neck pain, headaches, and migraine symptoms   Type of dizziness: Lightheadedness/Faint and feels like she has to grab onto things to try to steady herself  Feels like things are closing in around her, feels like a  smooshing and closing in sensation, but it doesn't get dark. Things get blurry and it makes her feel dizzy or lightheaded.    Frequency: Happens with changing positions - getting out of the car, sometimes will just be sitting and it will come on. Feels it more when she is switching positions, more of a lightheaded feeling    Duration: Not long    Aggravating factors: Spontaneous and Induced by position change: lying supine, supine to sit, and sit to stand   Relieving factors: being still and waiting for it to subside    Progression of  symptoms: unchanged   OCULOMOTOR EXAM:   Ocular Alignment: normal   Ocular ROM: WFL, pt squinting eyes throughout    Spontaneous Nystagmus: absent   Gaze-Induced Nystagmus: absent   Smooth Pursuits: intact and pt reporting that it hurts behind her L eye, frequent squinting    Saccades: hypometric/undershoots, extra eye movements, and impaired going to the L, pt reporting dizziness and squinting making it very hard to see pt's eyes, slow to R with pt reporting no symptoms. Afterwards, pt reporting hearing more in her L ear and this leads more to the dizziness ~3 saccadic beats in the vertical direction superiorly. When attempting to test in inferior direction, pt unable to focus (felt like she could see it but not see it)    Pt reporting feeling nauseous afterwards but reports that she does not need ginger ale or an emesis bag. Pt also reporting her head was hurting more. Unable to finish full vestibular assessment due to symptoms and pt explaining extensive PMH throughout session.    Access Code: [email or web address] URL: [email or web address] Date: 07/30/2023 Prepared by: Najat Olazabal  Due to pt only coming 1x every other week, attempted to provide with HEP for saccades as these made pt symptomatic. Only able to perform 10 reps in the horizontal direction. Cues to perform slowly.   Exercises - Seated Horizontal Saccades  - 2 x daily - 5 x weekly - 2 sets  - 10 reps - Seated Vertical Saccades  - 2 x daily - 5 x weekly - 2 sets - 10 reps  PATIENT EDUCATION: Education details: See therapeutic activity section above, PT unsure of exact cause of pt's dizziness as it is multi-factorial, importance of seeing an eye doctor (will see one tomorrow), initial HEP for saccades  Person educated: Patient Education method: Explanation, Demonstration, Verbal cues, and Handouts Education comprehension: verbalized understanding, returned demonstration, verbal cues required, and needs further education  HOME EXERCISE PROGRAM: Access Code: [email or web address] URL: [email or web address] Date: 07/30/2023 Prepared by: Sheffield Senate  Exercises - Seated Horizontal Saccades  - 2 x daily - 5 x weekly - 2 sets - 10 reps - Seated Vertical Saccades  - 2 x daily - 5 x weekly - 2 sets - 10 reps  GOALS: Goals reviewed with patient? Yes  SHORT TERM GOALS: ALL STGS= LTGS  LONG TERM GOALS: Target date: 08/19/2023  Pt will be independent with HEP for vestibular deficits for dizziness.  Baseline:  Goal status: INITIAL  2.  Pt will decr DHI to 65 or less in order to demo decr dizziness in regards to daily life.  Baseline:  Goal status: INITIAL  3.  MSQ to be assessed with LTG written.  Baseline:  Goal status: INITIAL   ASSESSMENT:  CLINICAL IMPRESSION: Pt reporting incr difficulty taking a shower as pt does not have an appropriate shower seat (just has a shower ledge). Pt also ambulates in with cane with seat and has incr pain/is unsteady. Discussed about putting in orders for a shower chair and rollator with pt in agreement with plan. Remainder of session focused on further vestibular assessment for dizziness. Unable to finish assessment due to pt explaining extensive PMH and symptoms throughout. Pt does report visual changes, esp with L eye, and discussed importance of seeing an eye doctor as this can be contributing to pt's dizziness (will see one next week).  With oculomotor exam, pt squinting throughout with difficulty with smooth pursuits to the L and impaired saccades, esp to the L. Difficulty to fully assess  as pt squinting and it was difficult to see her eyes. Pt did report incr nausea and dizziness from saccades. Did provide gentle HEP for slow saccades as this incr patient's symptoms (ran out of time at the end of session to fully review). Pt's dizziness symptoms are multi-factorial and PT unsure of exact cause of pt's dizziness. Will continue per POC as able - pt only able to come 1x every other week due to finances.    OBJECTIVE IMPAIRMENTS: Abnormal gait, decreased activity tolerance, decreased balance, decreased endurance, decreased mobility, difficulty walking, decreased ROM, decreased strength, dizziness, hypomobility, impaired flexibility, postural dysfunction, obesity, and pain.   ACTIVITY LIMITATIONS: carrying, lifting, bending, standing, stairs, and locomotion level  PARTICIPATION LIMITATIONS: meal prep, cleaning, laundry, driving, shopping, community activity, and occupation  PERSONAL FACTORS: Behavior pattern, Past/current experiences, Time since onset of injury/illness/exacerbation, Transportation, and 3+ comorbidities: Migraines, hx of right total knee replacement and an anterior cervical discectomy and fusion, obesity, depression are also affecting patient's functional outcome.   REHAB POTENTIAL: Fair due to finances/transportation  limiting frequency  CLINICAL DECISION MAKING: Evolving/moderate complexity  EVALUATION COMPLEXITY: Moderate   PLAN:  PT FREQUENCY: every other week due to finances/transportation   PT DURATION: 8 weeks  PLANNED INTERVENTIONS: 97164- PT Re-evaluation, 97110-Therapeutic exercises, 97530- Therapeutic activity, [id number]- Neuromuscular re-education, 97535- Self Care, [id number]- Manual therapy, [id number]- Gait training, [phone]- Canalith repositioning, Patient/Family education, Balance training, Vestibular training,  and DME instructions  PLAN FOR NEXT SESSION: how was eye doctor? How were saccades? Finish vestibular assessment/MSQ and give appropriate exercises  Did pt get order for rollator/shower chair?    Sheffield LOISE Senate, PT, DPT 07/30/2023, 2:46 PM

## 2023-07-31 ENCOUNTER — Telehealth: Payer: Self-pay | Admitting: Primary Care

## 2023-07-31 NOTE — Telephone Encounter (Signed)
Pt came in and picked up form.

## 2023-08-03 ENCOUNTER — Telehealth: Payer: Self-pay | Admitting: Primary Care

## 2023-08-03 NOTE — Telephone Encounter (Signed)
 Received a Email from Tovon Hamilton from [facility] Patient Engagement, stating pt recently screen positive for suicidal ideation. Pt is currently stable and reports no active thoughts of self-harm. She has been provided with resources to address her mental health concerns. I wanted to ensure you were aware so you can continue to monitor and support her as needed.SABRA Hoops stated it was in their protocol to ensure continuity of care & was required to notify the current provider of these types of situations. Tovon's work # is [phone], cell # is [phone], email is [email or web address], if there are any questions/concerns.

## 2023-08-03 NOTE — Telephone Encounter (Signed)
 Noted and appreciate the information.  Please call patient:  Let her know that we received this information. It sounds like she was provided resources, but let her know that we can see her for her depression concerns as well.

## 2023-08-04 ENCOUNTER — Telehealth: Payer: Self-pay | Admitting: Physical Therapy

## 2023-08-04 NOTE — Telephone Encounter (Signed)
 Noted, will evaluate.

## 2023-08-04 NOTE — Telephone Encounter (Signed)
 Called patient and reviewed all information. Patient verbalized understanding.  Patient preferred to schedule appt with Mallie to discuss her depression and possibly changing meds. She schedule virtual appt for 08/12/23.

## 2023-08-04 NOTE — Telephone Encounter (Signed)
 Comer Gaskins, NP,   Your patient Krystal Mann has been seen by PT at [facility]. She would benefit from an order for a rollator to help with mobility and decr fall risk as her cane is not stable enough for her. She would also benefit from an order for a shower chair in order for her to be able to safely sit in the shower. In order for insurance to cover these items, there also needs to be written documentation on why she would need these. Would you be able to addend your most recent note to include this or include it when you see the pt on 08/12/23?  Thanks so much, Sheffield Senate, PT, DPT 08/04/23 2:52 PM      [facility] [address] [place], [place]  [address] Phone:  [phone] Fax:  [phone]

## 2023-08-06 ENCOUNTER — Other Ambulatory Visit: Payer: Self-pay | Admitting: Primary Care

## 2023-08-06 DIAGNOSIS — G8929 Other chronic pain: Secondary | ICD-10-CM

## 2023-08-06 NOTE — Telephone Encounter (Signed)
 Please call patient:  Received refill request for Meloxicam . Is she taking this pill for pain everyday? I initially prescribed it for her neck pain.

## 2023-08-06 NOTE — Telephone Encounter (Signed)
 Noted.   Requested Prescriptions   Signed Prescriptions Disp Refills   meloxicam  (MOBIC ) 15 MG tablet 90 tablet 0    Sig: TAKE 1 TABLET BY MOUTH ONCE DAILY AS NEEDED FOR PAIN    Authorizing Provider: Dezeray Puccio K   Refill(s) sent to pharmacy.

## 2023-08-06 NOTE — Telephone Encounter (Signed)
 Called and spoke with patient, she stated she does not take the meloxicam  everyday only when needed. States her neck pain has improved, she will take the meloxicam  for her hip when it starts bothering her a lot.

## 2023-08-11 ENCOUNTER — Ambulatory Visit: Admitting: Physical Therapy

## 2023-08-12 ENCOUNTER — Telehealth (INDEPENDENT_AMBULATORY_CARE_PROVIDER_SITE_OTHER): Admitting: Primary Care

## 2023-08-12 ENCOUNTER — Encounter: Payer: Self-pay | Admitting: Primary Care

## 2023-08-12 DIAGNOSIS — F419 Anxiety disorder, unspecified: Secondary | ICD-10-CM | POA: Diagnosis not present

## 2023-08-12 DIAGNOSIS — M2550 Pain in unspecified joint: Secondary | ICD-10-CM

## 2023-08-12 DIAGNOSIS — G8929 Other chronic pain: Secondary | ICD-10-CM

## 2023-08-12 DIAGNOSIS — F32A Depression, unspecified: Secondary | ICD-10-CM

## 2023-08-12 DIAGNOSIS — Z59819 Housing instability, housed unspecified: Secondary | ICD-10-CM | POA: Diagnosis not present

## 2023-08-12 DIAGNOSIS — Z5986 Financial insecurity: Secondary | ICD-10-CM | POA: Insufficient documentation

## 2023-08-12 MED ORDER — DULOXETINE HCL 30 MG PO CPEP: 30.0000 mg | ORAL_CAPSULE | Freq: Every day | ORAL | 1 refills | Status: AC

## 2023-08-12 NOTE — Patient Instructions (Signed)
 We increased your dose of Cymbalta  to 90 mg daily for depression and pain.  Take both the 30 mg and 60 mg capsule daily.  Our case management team will be in touch with you soon.  Schedule a follow-up appointment for 1 month.  It can be a virtual visit like this one.  It was a pleasure to see you today!

## 2023-08-12 NOTE — Assessment & Plan Note (Signed)
 Referral placed to [facility].

## 2023-08-12 NOTE — Assessment & Plan Note (Signed)
 Deteriorated.  Emergent referral placed to the [facility] for food insecurity and housing insecurity. We discussed options.  Increase Cymbalta  to 90 mg daily. Close follow-up in 1 month.

## 2023-08-12 NOTE — Telephone Encounter (Signed)
 Hi Chloe!  I evaluated this patient today and entered the information you requested.  Let me know if anything further is needed.  Mallie Gaskins, NP-C

## 2023-08-12 NOTE — Progress Notes (Addendum)
 Patient ID: Krystal Mann, female    DOB: 20-Apr-1970, 53 y.o.   MRN: [id number]  Virtual visit completed through [employer], a video enabled telemedicine application. Due to national recommendations of social distancing due to COVID-19, a virtual visit is felt to be most appropriate for this patient at this time. Reviewed limitations, risks, security and privacy concerns of performing a virtual visit and the availability of in person appointments. I also reviewed that there may be a patient responsible charge related to this service. The patient agreed to proceed.   Patient location: home Provider location: [facility], office Persons participating in this virtual visit: patient, provider   If any vitals were documented, they were collected by patient at home unless specified below.    There were no vitals taken for this visit.   CC: Depression  Subjective:   HPI: Krystal Mann is a 53 y.o. female with a history of anxiety and depression, migraines, chronic joint pain, frequent headaches presenting on 08/12/2023 for Depression  Currently managed on Cymbalta  60 mg daily for depression and chronic joint pain and amitriptyline  25 mg HS for headache prevention. Following with neurology, initiated on topiramate 50 mg twice daily in June 2025 for headache prevention.  We were contacted on 08/03/2023 per [facility] patient engagement with reports of suicidal ideation for patient without active thoughts of self-harm.  Given this she was advised to schedule an appointment to discuss depression.  She has a chronic history of anxiety and depression, worse over the last year due to the progression of her joint pain and immobility. She uses a cane to ambulate but feels unstable, especially in the shower. She is mostly immobile and would benefit from a rollator walker to help with mobility and decreased fall risk as her cane is not stable enough for her. She would also benefit  from a shower chair in order for her to be able to safely sit in the shower.   Today she received an eviction notice on her front door. Her son, daughter in law, and grandchildren are staying with her as they help her physically. No one is working in her home. She is behind in bills as she is not working.   She does believe the Cymbalta  may be helping her knee pain, but she's not sure about her overall depression. She is working to apply for permanent disability. She has been approved for short term disability which will help some. She does not have active plans for self harm.     08/12/2023   11:44 AM 04/23/2023    2:59 PM 02/17/2023    8:41 AM 02/14/2022    8:44 AM  GAD 7 : Generalized Anxiety Score  Nervous, Anxious, on Edge 1 0 0 0  Control/stop worrying 1 0 0 1  Worry too much - different things 0 0 1 1  Trouble relaxing 0 1 0 1  Restless 0 0 0 1  Easily annoyed or irritable 0 1 0 1  Afraid - awful might happen 1 0 0 1  Total GAD 7 Score 3 2 1 6   Anxiety Difficulty Not difficult at all Not difficult at all Not difficult at all Very difficult       08/12/2023   11:43 AM 04/23/2023    2:59 PM 02/17/2023    8:41 AM  PHQ9 SCORE ONLY  PHQ-9 Total Score 9 6 7          Relevant past medical, surgical, family  and social history reviewed and updated as indicated. Interim medical history since our last visit reviewed. Allergies and medications reviewed and updated. Outpatient Medications Prior to Visit  Medication Sig Dispense Refill   acetaminophen  (TYLENOL ) 325 MG tablet Take 650 mg by mouth. 6A, 12N, 6P,     amitriptyline  (ELAVIL ) 25 MG tablet Take 1 tablet (25 mg total) by mouth at bedtime. For migraine prevention and joint pain 90 tablet 3   cetirizine  (ZYRTEC  ALLERGY) 10 MG tablet Take 1 tablet (10 mg total) by mouth daily. 30 tablet 2   diazepam  (VALIUM ) 5 MG tablet Take one tablet by mouth with light food one hour prior to procedure. 1 tablet 0   DULoxetine  (CYMBALTA ) 60 MG  capsule TAKE 1 CAPSULE BY MOUTH ONCE DAILY FOR  DEPRESSION  AND  JOINT  PAIN 90 capsule 2   fluticasone  (FLONASE ) 50 MCG/ACT nasal spray Place 1 spray into both nostrils daily. 16 g 0   meloxicam  (MOBIC ) 15 MG tablet TAKE 1 TABLET BY MOUTH ONCE DAILY AS NEEDED FOR PAIN 90 tablet 0   SUMAtriptan  (IMITREX ) 50 MG tablet Take 1 tablet by mouth at migraine onset. May repeat in 2 hours if headache persists or recurs. Do not exceed 2 tablets in 24 hours. 10 tablet 0   topiramate (TOPAMAX) 50 MG tablet Take 50 mg by mouth 2 (two) times daily.     meclizine  (ANTIVERT ) 12.5 MG tablet Take 1 tablet (12.5 mg total) by mouth 3 (three) times daily as needed for dizziness. (Patient not taking: Reported on 08/12/2023) 30 tablet 0   ondansetron  (ZOFRAN -ODT) 4 MG disintegrating tablet Take 1 tablet (4 mg total) by mouth every 8 (eight) hours as needed for nausea or vomiting. (Patient not taking: Reported on 08/12/2023) 20 tablet 0   No facility-administered medications prior to visit.     Per HPI unless specifically indicated in ROS section below Review of Systems  Musculoskeletal:  Positive for arthralgias.  Psychiatric/Behavioral:  The patient is nervous/anxious.        See HPI   Objective:  There were no vitals taken for this visit.  Wt Readings from Last 3 Encounters:  07/13/23 299 lb (135.6 kg)  04/23/23 (!) 307 lb (139.3 kg)  02/17/23 291 lb (132 kg)       Physical exam: General: Alert and oriented x 3, no distress, does not appear sickly  Pulmonary: Speaks in complete sentences without increased work of breathing, no cough during visit.  Psychiatric: Tearful during visit      Results for orders placed or performed in visit on 02/17/23  Cytology - PAP   Collection Time: 02/17/23  9:13 AM  Result Value Ref Range   High risk HPV Negative    Adequacy      Satisfactory for evaluation; transformation zone component ABSENT.   Diagnosis      - Negative for intraepithelial lesion or malignancy  (NILM)   Comment Normal Reference Range HPV - Negative   Hemoglobin A1c   Collection Time: 02/17/23  9:23 AM  Result Value Ref Range   Hgb A1c MFr Bld 6.4 4.6 - 6.5 %  Lipid panel   Collection Time: 02/17/23  9:23 AM  Result Value Ref Range   Cholesterol 122 0 - 200 mg/dL   Triglycerides 13.9 0.0 - 149.0 mg/dL   HDL 55.19 >60.99 mg/dL   VLDL 82.7 0.0 - 59.9 mg/dL   LDL Cholesterol 60 0 - 99 mg/dL   Total CHOL/HDL Ratio 3  NonHDL 76.75   Comprehensive metabolic panel   Collection Time: 02/17/23  9:23 AM  Result Value Ref Range   Sodium 138 135 - 145 mEq/L   Potassium 4.0 3.5 - 5.1 mEq/L   Chloride 101 96 - 112 mEq/L   CO2 30 19 - 32 mEq/L   Glucose, Bld 100 (H) 70 - 99 mg/dL   BUN 8 6 - 23 mg/dL   Creatinine, Ser 9.33 0.40 - 1.20 mg/dL   Total Bilirubin 0.6 0.2 - 1.2 mg/dL   Alkaline Phosphatase 112 39 - 117 U/L   AST 15 0 - 37 U/L   ALT 11 0 - 35 U/L   Total Protein 7.1 6.0 - 8.3 g/dL   Albumin 3.9 3.5 - 5.2 g/dL   GFR 00.55 >39.99 mL/min   Calcium 8.8 8.4 - 10.5 mg/dL  CBC   Collection Time: 02/17/23  9:23 AM  Result Value Ref Range   WBC 3.9 (L) 4.0 - 10.5 K/uL   RBC 4.46 3.87 - 5.11 Mil/uL   Platelets 268.0 150.0 - 400.0 K/uL   Hemoglobin 12.8 12.0 - 15.0 g/dL   HCT 61.3 63.9 - 53.9 %   MCV 86.7 78.0 - 100.0 fl   MCHC 33.2 30.0 - 36.0 g/dL   RDW 85.1 88.4 - 84.4 %   Assessment & Plan:   Problem List Items Addressed This Visit       Other   Chronic joint pain   Agree that she would benefit from rollator walker and a shower chair.        Relevant Medications   DULoxetine  (CYMBALTA ) 30 MG capsule   Anxiety and depression - Primary   Deteriorated.  Emergent referral placed to the [facility] for food insecurity and housing insecurity. We discussed options.  Increase Cymbalta  to 90 mg daily. Close follow-up in 1 month.      Relevant Medications   DULoxetine  (CYMBALTA ) 30 MG capsule   Housing insecurity   Referral placed to [facility].       Relevant  Orders   AMB Referral [facility] Care Management   Financial insecurity   Referral placed to [facility].      Relevant Orders   AMB Referral [facility] Care Management     Meds ordered this encounter  Medications   DULoxetine  (CYMBALTA ) 30 MG capsule    Sig: Take 1 capsule (30 mg total) by mouth daily. for anxiety and depression. Take with 60 mg dose.    Dispense:  90 capsule    Refill:  1    Supervising Provider:   AVELINA, AMY E [[id number]]   Orders Placed This Encounter  Procedures   AMB Referral [facility] Care Management    Referral Priority:   Routine    Referral Type:   Consultation    Referral Reason:   Care Coordination    Number of Visits Requested:   1    I discussed the assessment and treatment plan with the patient. The patient was provided an opportunity to ask questions and all were answered. The patient agreed with the plan and demonstrated an understanding of the instructions. The patient was advised to call back or seek an in-person evaluation if the symptoms worsen or if the condition fails to improve as anticipated.  Follow up plan:  We increased your dose of Cymbalta  to 90 mg daily for depression and pain.  Take both the 30 mg and 60 mg capsule daily.  Our case management team will be in touch with you soon.  Schedule a follow-up appointment for 1 month.  It can be a virtual visit like this one.  It was a pleasure to see you today!  Return in about 1 month (around 09/12/2023) for Follow up for Anxiety and Depression.  Jermia Rigsby K Sevon Rotert, NP

## 2023-08-12 NOTE — Assessment & Plan Note (Signed)
 Agree that she would benefit from rollator walker and a shower chair.

## 2023-08-13 ENCOUNTER — Telehealth: Payer: Self-pay

## 2023-08-13 NOTE — Progress Notes (Signed)
 Complex Care Management Note  Care Guide Note 08/13/2023 Name: [name] MRN: [id number] DOB: 1970/06/12  Krystal Bonita Schadt is a 53 y.o. year old female who sees Gretta Comer POUR, NP for primary care. I reached out to Jazleen Bonita Mann by phone today to offer complex care management services.  Ms. Chamorro was given information about Complex Care Management services today including:   The Complex Care Management services include support from the care team which includes your Nurse Care Manager, Clinical Social Worker, or Pharmacist.  The Complex Care Management team is here to help remove barriers to the health concerns and goals most important to you. Complex Care Management services are voluntary, and the patient may decline or stop services at any time by request to their care team member.   Complex Care Management Consent Status: Patient agreed to services and verbal consent obtained.   Follow up plan:  Telephone appointment with complex care management team member scheduled for:  08/14/2023  Encounter Outcome:  Patient Scheduled  Jeoffrey Buffalo , RMA     [place]  [facility], [facility] Guide  Direct Dial: [phone]  Website: [email or web address]

## 2023-08-14 ENCOUNTER — Other Ambulatory Visit: Payer: Self-pay | Admitting: Licensed Clinical Social Worker

## 2023-08-14 DIAGNOSIS — Z59819 Housing instability, housed unspecified: Secondary | ICD-10-CM

## 2023-08-14 DIAGNOSIS — Z5986 Financial insecurity: Secondary | ICD-10-CM

## 2023-08-14 NOTE — Patient Outreach (Signed)
 LCSW called patient to complete CCM initial assessment. Pt reported that she is unable to complete visit due to recent eviction. LCSW agreed to complete emergent referral for SDOH needs and provided supportive resources via email. Will collaborate with care guide to re-schedule initial.

## 2023-08-19 ENCOUNTER — Telehealth: Payer: Self-pay | Admitting: *Deleted

## 2023-08-19 NOTE — Progress Notes (Signed)
 Complex Care Management Note Care Guide Note  08/19/2023 Name: Krystal Mann MRN: [id number] DOB: 05-25-1970   Complex Care Management Outreach Attempts: An unsuccessful telephone outreach was attempted today to offer the patient information about available complex care management services.  Follow Up Plan:  Additional outreach attempts will be made to offer the patient complex care management information and services.   Encounter Outcome:  No Answer  [facility] Care Guide  Direct [phone] Fax:[phone] Website: [email or web address]

## 2023-08-20 ENCOUNTER — Telehealth: Payer: Self-pay | Admitting: *Deleted

## 2023-08-20 NOTE — Progress Notes (Signed)
 Complex Care Management Note Care Guide Note  08/20/2023 Name: Krystal Mann MRN: [id number] DOB: [birth year]   Complex Care Management Outreach Attempts: An unsuccessful telephone outreach was attempted today to offer the patient information about available complex care management services.  Follow Up Plan:  Additional outreach attempts will be made to offer the patient complex care management information and services.   Encounter Outcome:  No Answer  [facility] Health Care Guide  Direct Dial:[phone] [phone] Website: [email or web address]

## 2023-08-21 ENCOUNTER — Telehealth: Payer: Self-pay | Admitting: *Deleted

## 2023-08-21 NOTE — Progress Notes (Signed)
 Complex Care Management Note Care Guide Note  08/21/2023 Name: Krystal Mann MRN: [id number] DOB: Jun 06, 1970  Krystal Mann is a 53 y.o. year old female who is a primary care patient of Gretta Comer POUR, NP . The community resource team was consulted for assistance with Transportation Needs , Food Insecurity, Financial Difficulties related to Housing , and disability   SDOH screenings and interventions completed:  Yes  Social Drivers of Health From This Encounter   Transportation Needs: No Transportation Needs (08/21/2023)   PRAPARE - Administrator, Civil Service (Medical): No    Lack of Transportation (Non-Medical): No    SDOH Interventions Today    Flowsheet Row Most Recent Value  SDOH Interventions   Food Insecurity Interventions Community Resources Provided  [Provided food banks]  Housing Interventions Community Resources Provided  [Provided resources]  Transportation Interventions SCAT (Specialized Community Area Transporation)  Utilities Interventions Community Resources Provided  [Provided Community resources for utilities]     Care guide performed the following interventions: Patient provided with information about care guide support team and interviewed to confirm resource needs.  Follow Up Plan:  No further follow up planned at this time. The patient has been provided with needed resources.  Encounter Outcome:  Patient Visit Completed [facility]  [facility] HealthPopulation Health Care Guide  Direct Dial:[phone] Fax:[phone] Website: [email or web address]

## 2023-09-17 DIAGNOSIS — H5213 Myopia, bilateral: Secondary | ICD-10-CM | POA: Diagnosis not present

## 2023-09-22 NOTE — Progress Notes (Signed)
     Krystal Mann T. Krystal Bertucci, MD, CAQ Sports Medicine [facility] at [facility] [address] [place], [address]  Phone: [phone]  FAX: [phone]  Krystal Mann - 53 y.o. female  MRN [id number]  Date of Birth: [birth year]  Date: 09/23/2023  PCP: Krystal Mann POUR, NP  Referral: Krystal Mann POUR, NP  No chief complaint on file.  Subjective:   Krystal Mann is a 53 y.o. very pleasant female patient with There is no height or weight on file to calculate BMI. who presents with the following:  Discussed the use of AI scribe software for clinical note transcription with the patient, who gave verbal consent to proceed.  Patient presents with some ongoing left hip pain along with some concerns about ongoing knee pain.  Primary concern is left hip pain. History of Present Illness     Review of Systems is noted in the HPI, as appropriate  Objective:   There were no vitals taken for this visit.  GEN: No acute distress; alert,appropriate. PULM: Breathing comfortably in no respiratory distress PSYCH: Normally interactive.   Physical Exam   Laboratory and Imaging Data:  Assessment and Plan:   No diagnosis found. Assessment & Plan   Medication Management during today's office visit: No orders of the defined types were placed in this encounter.  There are no discontinued medications.  Orders placed today for conditions managed today: No orders of the defined types were placed in this encounter.   Disposition: No follow-ups on file.  [employer] One speech-to-text software was used for transcription in this dictation.  Possible transcriptional errors can occur using Animal nutritionist.   Signed,  Jacques DASEN. Leyani Gargus, MD   Outpatient Encounter Medications as of 09/23/2023  Medication Sig   acetaminophen  (TYLENOL ) 325 MG tablet Take 650 mg by mouth. 6A, 12N, 6P,   amitriptyline  (ELAVIL ) 25 MG tablet Take 1 tablet (25 mg  total) by mouth at bedtime. For migraine prevention and joint pain   cetirizine  (ZYRTEC  ALLERGY) 10 MG tablet Take 1 tablet (10 mg total) by mouth daily.   diazepam  (VALIUM ) 5 MG tablet Take one tablet by mouth with light food one hour prior to procedure.   DULoxetine  (CYMBALTA ) 30 MG capsule Take 1 capsule (30 mg total) by mouth daily. for anxiety and depression. Take with 60 mg dose.   DULoxetine  (CYMBALTA ) 60 MG capsule TAKE 1 CAPSULE BY MOUTH ONCE DAILY FOR  DEPRESSION  AND  JOINT  PAIN   fluticasone  (FLONASE ) 50 MCG/ACT nasal spray Place 1 spray into both nostrils daily.   meclizine  (ANTIVERT ) 12.5 MG tablet Take 1 tablet (12.5 mg total) by mouth 3 (three) times daily as needed for dizziness. (Patient not taking: Reported on 08/12/2023)   meloxicam  (MOBIC ) 15 MG tablet TAKE 1 TABLET BY MOUTH ONCE DAILY AS NEEDED FOR PAIN   ondansetron  (ZOFRAN -ODT) 4 MG disintegrating tablet Take 1 tablet (4 mg total) by mouth every 8 (eight) hours as needed for nausea or vomiting. (Patient not taking: Reported on 08/12/2023)   SUMAtriptan  (IMITREX ) 50 MG tablet Take 1 tablet by mouth at migraine onset. May repeat in 2 hours if headache persists or recurs. Do not exceed 2 tablets in 24 hours.   topiramate (TOPAMAX) 50 MG tablet Take 50 mg by mouth 2 (two) times daily.   No facility-administered encounter medications on file as of 09/23/2023.

## 2023-09-23 ENCOUNTER — Encounter: Payer: Self-pay | Admitting: Family Medicine

## 2023-09-23 ENCOUNTER — Ambulatory Visit: Admitting: Family Medicine

## 2023-09-23 ENCOUNTER — Ambulatory Visit (INDEPENDENT_AMBULATORY_CARE_PROVIDER_SITE_OTHER)
Admission: RE | Admit: 2023-09-23 | Discharge: 2023-09-23 | Disposition: A | Discharge status: Home / Self Care | Source: Ambulatory Visit | Attending: Family Medicine | Admitting: Family Medicine

## 2023-09-23 VITALS — BP 128/80 | HR 104 | Temp 97.7°F | Ht 67.0 in | Wt 298.0 lb

## 2023-09-23 DIAGNOSIS — T8482XA Fibrosis due to internal orthopedic prosthetic devices, implants and grafts, initial encounter: Secondary | ICD-10-CM

## 2023-09-23 DIAGNOSIS — M25561 Pain in right knee: Secondary | ICD-10-CM | POA: Diagnosis not present

## 2023-09-23 DIAGNOSIS — M25661 Stiffness of right knee, not elsewhere classified: Secondary | ICD-10-CM

## 2023-09-23 DIAGNOSIS — Z96651 Presence of right artificial knee joint: Secondary | ICD-10-CM

## 2023-09-23 DIAGNOSIS — M25552 Pain in left hip: Secondary | ICD-10-CM

## 2023-09-23 DIAGNOSIS — M5442 Lumbago with sciatica, left side: Secondary | ICD-10-CM

## 2023-09-23 DIAGNOSIS — G8929 Other chronic pain: Secondary | ICD-10-CM | POA: Diagnosis not present

## 2023-09-23 DIAGNOSIS — Z975 Presence of (intrauterine) contraceptive device: Secondary | ICD-10-CM | POA: Diagnosis not present

## 2023-09-25 ENCOUNTER — Ambulatory Visit: Admitting: Primary Care

## 2023-09-25 ENCOUNTER — Telehealth: Payer: Self-pay | Admitting: Primary Care

## 2023-09-25 ENCOUNTER — Encounter: Payer: Self-pay | Admitting: Primary Care

## 2023-09-25 VITALS — BP 106/78 | HR 96 | Temp 97.4°F | Ht 67.0 in | Wt 298.0 lb

## 2023-09-25 DIAGNOSIS — M1711 Unilateral primary osteoarthritis, right knee: Secondary | ICD-10-CM | POA: Diagnosis not present

## 2023-09-25 DIAGNOSIS — G8929 Other chronic pain: Secondary | ICD-10-CM | POA: Diagnosis not present

## 2023-09-25 DIAGNOSIS — M25552 Pain in left hip: Secondary | ICD-10-CM | POA: Diagnosis not present

## 2023-09-25 DIAGNOSIS — G43009 Migraine without aura, not intractable, without status migrainosus: Secondary | ICD-10-CM | POA: Diagnosis not present

## 2023-09-25 MED ORDER — SUMATRIPTAN SUCCINATE 50 MG PO TABS: ORAL_TABLET | ORAL | 0 refills | Status: AC

## 2023-09-25 NOTE — Assessment & Plan Note (Signed)
 Severe based on exam today.  Await xray results. Consider MR of the hip.  Proceed with orthopedic evaluation, home health physical therapy, pain management.  Continue Cymbalta  90 mg daily, meloxicam  15 mg daily, amitriptyline  25 mg at bedtime

## 2023-09-25 NOTE — Telephone Encounter (Signed)
 Patient states forgot to give paperwork to provider during visit. Dropped off in provider box to be filled out please call patient when ready for pick up.

## 2023-09-25 NOTE — Patient Instructions (Signed)
 Monitor your MyChart portal for a letter from both orthopedics and pain management to schedule appointments.  Notify me when the xray pops up on your MyChart portal.  It was a pleasure to see you today!

## 2023-09-25 NOTE — Assessment & Plan Note (Signed)
 Ongoing.  Agree with orthopedic and pain management evaluation for which is pending.  Agree that she needs assistance with ADLs. Agree to complete approval for Medicaid to provide her with a caregiver.

## 2023-09-25 NOTE — Progress Notes (Signed)
 Subjective:    Patient ID: Krystal Mann, female    DOB: 06/04/1970, 53 y.o.   MRN: [id number]  Krystal Mann is a very pleasant 53 y.o. female with a history of bilateral chronic knee pain, prediabetes, frequent headaches, anxiety depression, chronic neck pain, chronic back pain who presents today to discuss chronic left hip pain.  She is also needing a refill of her sumatriptan .  Her son and daughter-in-law joined us  today.  History of right total knee replacement 8 years ago, but since mid 2020 she has noticed increased pain and stiffness, decrease in range of motion limiting her mobility significantly.  Because of this she has been overcompensating with her left lower extremity which has resulted in left hip pain with radiation to her lower back and lower extremity with occasional numbness.  She uses a walker for stability.   She continues to experience ongoing severe left hip pain which began in late April 2025, and chronic right knee pain. She is unable to stand on her own. She cannot lay on her left side due to her hip pain. She has to lift her own leg to turn over to the right side.   She is cared for by her son and daughter in law. She is trying to get her daughter in law paid for care giving and needs approval for Medicaid. She needs a home health evaluation for this but isn't sure what is needed.   Evaluated by sports medicine on 09/23/2023 for worsening bilateral hip and knee pain.  During this visit home health physical therapy was ordered for severe left hip pain and weakness.  She also underwent plain films of the left hip.  Referrals were placed for orthopedic surgery evaluation and pain management.  Evaluated by physical medicine in May 2025, received epidural injections which did not help with pain.  She underwent MRI lumbar spine which showed arthritis and minor disc bulging.    BP Readings from Last 3 Encounters:  09/25/23 106/78  09/23/23 128/80  07/30/23  106/81      Review of Systems  Musculoskeletal:  Positive for arthralgias and myalgias.  Skin:  Negative for color change.  Neurological:  Positive for weakness and numbness.         Past Medical History:  Diagnosis Date   Anemia    Arthritis    knee   Axillary abscess 06/13/2014   Back pain    Breast lump on right side at 9 o'clock position 07/27/2012   Referred patient to [facility] for diagnostic mammogram and right breast ultrasound. Appointment scheduled for Wednesday, August 04, 2012 at 1510.    Chicken pox    Depression 2004   Diverticulitis    Hx of adenomatous colonic polyps 02/19/2022   Migraines    no longer has them. Was medication related   Rotator cuff (capsule) sprain    right   Seasonal allergies    Shortness of breath dyspnea    occasionally    Social History   Socioeconomic History   Marital status: Single    Spouse name: Not on file   Number of children: 2   Years of education: Not on file   Highest education level: Some college, no degree  Occupational History   Occupation: student bus driver   Occupation: customer service -road side assistance  Tobacco Use   Smoking status: Never   Smokeless tobacco: Never  Vaping Use   Vaping status: Never Used  Substance and Sexual Activity   Alcohol use: Yes    Alcohol/week: 0.0 standard drinks of alcohol    Comment: social   Drug use: No   Sexual activity: Yes    Partners: Male    Birth control/protection: Surgical    Comment: tubial ligation  Other Topics Concern   Not on file  Social History Narrative   Single.   2 Children, 1 grandson.   Works as a Surveyor, mining.   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   Social Drivers of Corporate investment banker Strain: High Risk (08/11/2023)   Overall Financial Resource Strain (CARDIA)    Difficulty of Paying Living Expenses: Very hard  Food Insecurity: Food Insecurity Present (08/11/2023)    Hunger Vital Sign    Worried About Running Out of Food in the Last Year: Often true    Ran Out of Food in the Last Year: Sometimes true  Transportation Needs: No Transportation Needs (08/21/2023)   PRAPARE - Administrator, Civil Service (Medical): No    Lack of Transportation (Non-Medical): No  Recent Concern: Transportation Needs - Unmet Transportation Needs (08/11/2023)   PRAPARE - Transportation    Lack of Transportation (Medical): Yes    Lack of Transportation (Non-Medical): Yes  Physical Activity: Inactive (08/11/2023)   Exercise Vital Sign    Days of Exercise per Week: 0 days    Minutes of Exercise per Session: Not on file  Stress: Stress Concern Present (08/11/2023)   [employer] of Occupational Health - Occupational Stress Questionnaire    Feeling of Stress: To some extent  Social Connections: Socially Isolated (08/11/2023)   Social Connection and Isolation Panel    Frequency of Communication with Friends and Family: Once a week    Frequency of Social Gatherings with Friends and Family: Once a week    Attends Religious Services: Never    Database administrator or Organizations: Yes    Attends Banker Meetings: Never    Marital Status: Divorced  Catering manager Violence: Not on file    Past Surgical History:  Procedure Laterality Date   APPLICATION OF A-CELL OF CHEST/ABDOMEN Bilateral 12/31/2015   Procedure: APPLICATION OF A-CELL OF BILATERAL AXILLA;  Surgeon: Vicenta Poli, MD;  Location: [facility];  Service: General;  Laterality: Bilateral;   BREAST CYST ASPIRATION  2014   CERVICAL DISCECTOMY  2012   HYDRADENITIS EXCISION Bilateral 12/31/2015   Procedure: WIDE EXCISION BILATERAL AXILLARY HIDRADENITIS WITH ACELL APPLICATION;  Surgeon: Vicenta Poli, MD;  Location: [facility];  Service: General;  Laterality: Bilateral;   JOINT REPLACEMENT     TOTAL KNEE ARTHROPLASTY Right 06/18/2015   Procedure: TOTAL KNEE  ARTHROPLASTY;  Surgeon: Dempsey Sensor, MD;  Location: [facility];  Service: Orthopedics;  Laterality: Right;   TUBAL LIGATION      Family History  Problem Relation Age of Onset   Colon cancer Mother 37   Kidney cancer Mother 47   Diabetes Mother    Hyperlipidemia Mother    Melanoma Mother    Lung cancer Father 74 - 20   Diabetes Sister    Asthma Sister    Colon cancer Brother 24   Colon polyps Brother        >40 polyps   Colon cancer Brother 20   Colon polyps Brother        8 polyps   Cancer Maternal Uncle    Cancer Paternal Uncle  unknown types   Esophageal cancer Neg Hx    Rectal cancer Neg Hx    Stomach cancer Neg Hx     Allergies  Allergen Reactions   Morphine And Codeine Itching and Nausea And Vomiting   Medroxyprogesterone      Gave her migraine for 3 months   Hydrocodone  Itching    Current Outpatient Medications on File Prior to Visit  Medication Sig Dispense Refill   acetaminophen  (TYLENOL ) 325 MG tablet Take 650 mg by mouth. 6A, 12N, 6P,     amitriptyline  (ELAVIL ) 25 MG tablet Take 1 tablet (25 mg total) by mouth at bedtime. For migraine prevention and joint pain 90 tablet 3   cetirizine  (ZYRTEC  ALLERGY) 10 MG tablet Take 1 tablet (10 mg total) by mouth daily. 30 tablet 2   DULoxetine  (CYMBALTA ) 30 MG capsule Take 1 capsule (30 mg total) by mouth daily. for anxiety and depression. Take with 60 mg dose. 90 capsule 1   DULoxetine  (CYMBALTA ) 60 MG capsule TAKE 1 CAPSULE BY MOUTH ONCE DAILY FOR  DEPRESSION  AND  JOINT  PAIN 90 capsule 2   fluticasone  (FLONASE ) 50 MCG/ACT nasal spray Place 1 spray into both nostrils daily. 16 g 0   meloxicam  (MOBIC ) 15 MG tablet TAKE 1 TABLET BY MOUTH ONCE DAILY AS NEEDED FOR PAIN 90 tablet 0   topiramate (TOPAMAX) 50 MG tablet Take 50 mg by mouth 2 (two) times daily.     diazepam  (VALIUM ) 5 MG tablet Take one tablet by mouth with light food one hour prior to procedure. (Patient not taking: Reported on 09/25/2023) 1 tablet 0   No  current facility-administered medications on file prior to visit.    BP 106/78   Pulse 96   Temp (!) 97.4 F (36.3 C) (Temporal)   Ht 5' 7 (1.702 m)   Wt 298 lb (135.2 kg)   SpO2 98%   BMI 46.67 kg/m  Objective:   Physical Exam Cardiovascular:     Rate and Rhythm: Normal rate and regular rhythm.  Pulmonary:     Effort: Pulmonary effort is normal.     Breath sounds: Normal breath sounds.  Skin:    General: Skin is warm and dry.  Neurological:     Mental Status: She is alert.     Physical Exam        Assessment & Plan:  Primary osteoarthritis of right knee Assessment & Plan: Ongoing.  Agree with orthopedic and pain management evaluation for which is pending.  Agree that she needs assistance with ADLs. Agree to complete approval for Medicaid to provide her with a caregiver.    Migraine without aura and without status migrainosus, not intractable -     SUMAtriptan  Succinate; Take 1 tablet by mouth at migraine onset. May repeat in 2 hours if headache persists or recurs. Do not exceed 2 tablets in 24 hours.  Dispense: 10 tablet; Refill: 0  Chronic left hip pain Assessment & Plan: Severe based on exam today.  Await xray results. Consider MR of the hip.  Proceed with orthopedic evaluation, home health physical therapy, pain management.  Continue Cymbalta  90 mg daily, meloxicam  15 mg daily, amitriptyline  25 mg at bedtime     Assessment and Plan Assessment & Plan   25 minutes was spent face-to-face with patient and her family discussing her plan of care and reviewing prior office notes and imaging.  See assessment and plan.      Eunice Oldaker K Cathie Bonnell, NP    History  of Present Illness

## 2023-09-28 NOTE — Telephone Encounter (Signed)
 Completed and placed in Kelli's inbox for the month of October

## 2023-10-01 ENCOUNTER — Ambulatory Visit: Payer: Self-pay | Admitting: Family Medicine

## 2023-10-01 DIAGNOSIS — G8929 Other chronic pain: Secondary | ICD-10-CM

## 2023-10-14 ENCOUNTER — Ambulatory Visit (HOSPITAL_COMMUNITY)
Admission: RE | Admit: 2023-10-14 | Discharge: 2023-10-14 | Disposition: A | Discharge status: Home / Self Care | Source: Ambulatory Visit | Attending: Primary Care | Admitting: Primary Care

## 2023-10-14 DIAGNOSIS — M25552 Pain in left hip: Secondary | ICD-10-CM | POA: Insufficient documentation

## 2023-10-14 DIAGNOSIS — G8929 Other chronic pain: Secondary | ICD-10-CM | POA: Insufficient documentation

## 2023-10-19 ENCOUNTER — Ambulatory Visit: Payer: Self-pay | Admitting: Primary Care

## 2023-10-19 DIAGNOSIS — F419 Anxiety disorder, unspecified: Secondary | ICD-10-CM

## 2023-10-19 DIAGNOSIS — D649 Anemia, unspecified: Secondary | ICD-10-CM

## 2023-10-19 DIAGNOSIS — M25561 Pain in right knee: Secondary | ICD-10-CM | POA: Diagnosis not present

## 2023-10-19 DIAGNOSIS — H409 Unspecified glaucoma: Secondary | ICD-10-CM

## 2023-10-19 DIAGNOSIS — G43909 Migraine, unspecified, not intractable, without status migrainosus: Secondary | ICD-10-CM

## 2023-10-19 DIAGNOSIS — T8484XD Pain due to internal orthopedic prosthetic devices, implants and grafts, subsequent encounter: Secondary | ICD-10-CM | POA: Diagnosis not present

## 2023-10-19 DIAGNOSIS — M5134 Other intervertebral disc degeneration, thoracic region: Secondary | ICD-10-CM

## 2023-10-19 DIAGNOSIS — F32A Depression, unspecified: Secondary | ICD-10-CM

## 2023-10-19 DIAGNOSIS — M1612 Unilateral primary osteoarthritis, left hip: Secondary | ICD-10-CM | POA: Diagnosis not present

## 2023-10-19 DIAGNOSIS — T8482XD Fibrosis due to internal orthopedic prosthetic devices, implants and grafts, subsequent encounter: Secondary | ICD-10-CM | POA: Diagnosis not present

## 2023-10-19 DIAGNOSIS — M5116 Intervertebral disc disorders with radiculopathy, lumbar region: Secondary | ICD-10-CM

## 2023-10-19 DIAGNOSIS — M47816 Spondylosis without myelopathy or radiculopathy, lumbar region: Secondary | ICD-10-CM

## 2023-11-05 ENCOUNTER — Other Ambulatory Visit: Payer: Self-pay | Admitting: Primary Care

## 2023-11-05 DIAGNOSIS — G8929 Other chronic pain: Secondary | ICD-10-CM

## 2023-11-05 DIAGNOSIS — T8484XD Pain due to internal orthopedic prosthetic devices, implants and grafts, subsequent encounter: Secondary | ICD-10-CM | POA: Diagnosis not present

## 2023-11-16 ENCOUNTER — Encounter: Payer: Self-pay | Admitting: Radiology

## 2023-11-20 DIAGNOSIS — M25552 Pain in left hip: Secondary | ICD-10-CM | POA: Diagnosis not present

## 2023-11-20 DIAGNOSIS — M5442 Lumbago with sciatica, left side: Secondary | ICD-10-CM | POA: Diagnosis not present

## 2023-11-20 DIAGNOSIS — Z96651 Presence of right artificial knee joint: Secondary | ICD-10-CM | POA: Diagnosis not present

## 2023-11-20 DIAGNOSIS — T8482XA Fibrosis due to internal orthopedic prosthetic devices, implants and grafts, initial encounter: Secondary | ICD-10-CM | POA: Diagnosis not present

## 2023-11-20 DIAGNOSIS — G8929 Other chronic pain: Secondary | ICD-10-CM | POA: Diagnosis not present

## 2023-11-20 DIAGNOSIS — M25661 Stiffness of right knee, not elsewhere classified: Secondary | ICD-10-CM | POA: Diagnosis not present

## 2023-11-23 DIAGNOSIS — Z113 Encounter for screening for infections with a predominantly sexual mode of transmission: Secondary | ICD-10-CM

## 2023-11-27 DIAGNOSIS — T8484XD Pain due to internal orthopedic prosthetic devices, implants and grafts, subsequent encounter: Secondary | ICD-10-CM | POA: Diagnosis not present

## 2023-12-03 ENCOUNTER — Other Ambulatory Visit: Payer: Self-pay | Admitting: Primary Care

## 2023-12-03 DIAGNOSIS — F32A Depression, unspecified: Secondary | ICD-10-CM

## 2023-12-15 DIAGNOSIS — Z96651 Presence of right artificial knee joint: Secondary | ICD-10-CM | POA: Diagnosis not present

## 2023-12-15 DIAGNOSIS — M25552 Pain in left hip: Secondary | ICD-10-CM | POA: Diagnosis not present

## 2023-12-15 NOTE — Telephone Encounter (Signed)
 Patient arrived in office and picked up ppwk

## 2023-12-16 ENCOUNTER — Telehealth: Payer: Self-pay | Admitting: Primary Care

## 2023-12-16 NOTE — Telephone Encounter (Signed)
 Copied from CRM [id number]. Topic: General - Other >> Dec 15, 2023  3:41 PM Brittany M wrote: Reason for CRM: Patient is unsure if she can pick up the short term disability paperwork before 5pm today- if she does not get there she is asking for it to be faxed to [phone] That goes to Attn- [email or web address]

## 2024-01-13 ENCOUNTER — Telehealth: Payer: Self-pay

## 2024-01-13 NOTE — Telephone Encounter (Signed)
 Completed and placed on Krystal Mann's desk.  Patiently typically likes to pick up paperwork, but you can ask if she prefers a fax.

## 2024-01-13 NOTE — Telephone Encounter (Signed)
 Uploaded forms from patient received.  Will fax to [facility] system at [phone] when complete  Forms placed in providers box for review

## 2024-01-15 NOTE — Telephone Encounter (Signed)
 Patient arrived in office to picked up ppwk

## 2024-01-15 NOTE — Telephone Encounter (Addendum)
 Completed forms received and faxed to [facility] system at [phone]  Copy sent to scan.  Copy left at front desk for patient to pick up for her records/take additional copy to employer if needed.  MyChart message left for patient letting her knowl

## 2024-01-17 ENCOUNTER — Other Ambulatory Visit: Payer: Self-pay | Admitting: Primary Care

## 2024-01-17 DIAGNOSIS — F331 Major depressive disorder, recurrent, moderate: Secondary | ICD-10-CM

## 2024-01-17 DIAGNOSIS — G8929 Other chronic pain: Secondary | ICD-10-CM

## 2024-01-18 NOTE — Telephone Encounter (Signed)
 Patient is due for CPE/follow up in mid to late February, this will be required prior to any further refills.    Can we schedule her in a 12:20 or 3:20 slot? She requires extra time Please schedule, thank you!

## 2024-01-20 ENCOUNTER — Other Ambulatory Visit (HOSPITAL_COMMUNITY): Payer: Self-pay | Admitting: Orthopedic Surgery

## 2024-01-20 DIAGNOSIS — Z96651 Presence of right artificial knee joint: Secondary | ICD-10-CM

## 2024-01-25 ENCOUNTER — Ambulatory Visit (HOSPITAL_COMMUNITY)
Admission: RE | Admit: 2024-01-25 | Discharge: 2024-01-25 | Disposition: A | Discharge status: Home / Self Care | Source: Ambulatory Visit | Attending: Orthopedic Surgery | Admitting: Orthopedic Surgery

## 2024-01-25 ENCOUNTER — Encounter (HOSPITAL_COMMUNITY): Payer: Self-pay

## 2024-01-25 DIAGNOSIS — Z96651 Presence of right artificial knee joint: Secondary | ICD-10-CM | POA: Insufficient documentation

## 2024-01-25 MED ORDER — TECHNETIUM TC 99M MEDRONATE IV KIT: 20.0000 | PACK | Freq: Once | INTRAVENOUS | Status: DC

## 2024-02-01 DIAGNOSIS — R519 Headache, unspecified: Secondary | ICD-10-CM

## 2024-02-01 DIAGNOSIS — G43009 Migraine without aura, not intractable, without status migrainosus: Secondary | ICD-10-CM

## 2024-02-02 MED ORDER — TOPIRAMATE 100 MG PO TABS: 100.0000 mg | ORAL_TABLET | Freq: Two times a day (BID) | ORAL | 0 refills | Status: AC

## 2024-02-09 NOTE — Telephone Encounter (Signed)
 Forms printed for provider to complete.  Patient will pick forms up Thursday 1.29.26 to email to employer.  Forms given to provider to review.

## 2024-02-12 NOTE — Telephone Encounter (Signed)
 Pt picked up form

## 2024-02-12 NOTE — Telephone Encounter (Signed)
 Patient picked up ppwk in office today
